# Patient Record
Sex: Female | Born: 1945 | Race: White | Hispanic: No | Smoking: Former smoker
Health system: Southern US, Community
[De-identification: ages and names within clinical notes are randomized; demographics above are authoritative.]

## PROBLEM LIST (undated history)

## (undated) DIAGNOSIS — K760 Fatty (change of) liver, not elsewhere classified: Secondary | ICD-10-CM

## (undated) DIAGNOSIS — E119 Type 2 diabetes mellitus without complications: Secondary | ICD-10-CM

## (undated) DIAGNOSIS — D509 Iron deficiency anemia, unspecified: Secondary | ICD-10-CM

## (undated) DIAGNOSIS — M1990 Unspecified osteoarthritis, unspecified site: Secondary | ICD-10-CM

## (undated) DIAGNOSIS — Z9289 Personal history of other medical treatment: Secondary | ICD-10-CM

## (undated) DIAGNOSIS — N186 End stage renal disease: Secondary | ICD-10-CM

## (undated) DIAGNOSIS — Z992 Dependence on renal dialysis: Secondary | ICD-10-CM

## (undated) DIAGNOSIS — J811 Chronic pulmonary edema: Secondary | ICD-10-CM

## (undated) DIAGNOSIS — F419 Anxiety disorder, unspecified: Secondary | ICD-10-CM

## (undated) DIAGNOSIS — R011 Cardiac murmur, unspecified: Secondary | ICD-10-CM

## (undated) DIAGNOSIS — N2581 Secondary hyperparathyroidism of renal origin: Secondary | ICD-10-CM

## (undated) DIAGNOSIS — J449 Chronic obstructive pulmonary disease, unspecified: Secondary | ICD-10-CM

## (undated) DIAGNOSIS — I5020 Unspecified systolic (congestive) heart failure: Secondary | ICD-10-CM

## (undated) DIAGNOSIS — E669 Obesity, unspecified: Secondary | ICD-10-CM

## (undated) DIAGNOSIS — F32A Depression, unspecified: Secondary | ICD-10-CM

## (undated) DIAGNOSIS — Z950 Presence of cardiac pacemaker: Secondary | ICD-10-CM

## (undated) DIAGNOSIS — D696 Thrombocytopenia, unspecified: Secondary | ICD-10-CM

## (undated) DIAGNOSIS — F329 Major depressive disorder, single episode, unspecified: Secondary | ICD-10-CM

## (undated) DIAGNOSIS — I4891 Unspecified atrial fibrillation: Secondary | ICD-10-CM

## (undated) HISTORY — PX: FRACTURE SURGERY: SHX138

## (undated) HISTORY — DX: Iron deficiency anemia, unspecified: D50.9

## (undated) HISTORY — PX: AV FISTULA PLACEMENT: SHX1204

## (undated) HISTORY — PX: AV FISTULA REPAIR: SHX563

## (undated) HISTORY — PX: INSERT / REPLACE / REMOVE PACEMAKER: SUR710

## (undated) HISTORY — PX: CARDIAC CATHETERIZATION: SHX172

---

## 2015-09-20 HISTORY — PX: CATARACT EXTRACTION W/ INTRAOCULAR LENS  IMPLANT, BILATERAL: SHX1307

## 2016-10-27 ENCOUNTER — Encounter: Payer: Self-pay | Admitting: Vascular Surgery

## 2016-10-27 ENCOUNTER — Other Ambulatory Visit: Payer: Self-pay | Admitting: *Deleted

## 2016-10-27 DIAGNOSIS — T82510A Breakdown (mechanical) of surgically created arteriovenous fistula, initial encounter: Secondary | ICD-10-CM

## 2016-10-28 ENCOUNTER — Encounter: Payer: Self-pay | Admitting: Vascular Surgery

## 2016-10-31 ENCOUNTER — Encounter: Payer: Self-pay | Admitting: Vascular Surgery

## 2016-11-07 ENCOUNTER — Other Ambulatory Visit (HOSPITAL_COMMUNITY): Payer: Medicare Other

## 2016-11-07 ENCOUNTER — Encounter: Payer: Medicare Other | Admitting: Vascular Surgery

## 2016-11-25 ENCOUNTER — Encounter: Payer: Self-pay | Admitting: Surgery

## 2016-12-05 ENCOUNTER — Encounter: Payer: Medicare Other | Admitting: Surgery

## 2016-12-05 ENCOUNTER — Inpatient Hospital Stay (HOSPITAL_COMMUNITY): Admission: RE | Admit: 2016-12-05 | Payer: Medicare Other | Source: Ambulatory Visit

## 2017-01-19 ENCOUNTER — Inpatient Hospital Stay (HOSPITAL_COMMUNITY)
Admission: EM | Admit: 2017-01-19 | Discharge: 2017-01-26 | DRG: 480 | Disposition: A | Discharge status: Skilled Nursing Facility | Payer: Medicare Other | Attending: Internal Medicine | Admitting: Internal Medicine

## 2017-01-19 ENCOUNTER — Inpatient Hospital Stay (HOSPITAL_COMMUNITY): Payer: Medicare Other

## 2017-01-19 ENCOUNTER — Emergency Department (HOSPITAL_COMMUNITY): Payer: Medicare Other

## 2017-01-19 ENCOUNTER — Encounter (HOSPITAL_COMMUNITY): Payer: Self-pay

## 2017-01-19 DIAGNOSIS — I10 Essential (primary) hypertension: Secondary | ICD-10-CM | POA: Diagnosis present

## 2017-01-19 DIAGNOSIS — I77819 Aortic ectasia, unspecified site: Secondary | ICD-10-CM | POA: Diagnosis present

## 2017-01-19 DIAGNOSIS — D62 Acute posthemorrhagic anemia: Secondary | ICD-10-CM | POA: Diagnosis not present

## 2017-01-19 DIAGNOSIS — I4891 Unspecified atrial fibrillation: Secondary | ICD-10-CM | POA: Diagnosis present

## 2017-01-19 DIAGNOSIS — I132 Hypertensive heart and chronic kidney disease with heart failure and with stage 5 chronic kidney disease, or end stage renal disease: Secondary | ICD-10-CM | POA: Diagnosis present

## 2017-01-19 DIAGNOSIS — Z880 Allergy status to penicillin: Secondary | ICD-10-CM | POA: Diagnosis not present

## 2017-01-19 DIAGNOSIS — Z87891 Personal history of nicotine dependence: Secondary | ICD-10-CM

## 2017-01-19 DIAGNOSIS — M8580 Other specified disorders of bone density and structure, unspecified site: Secondary | ICD-10-CM | POA: Diagnosis present

## 2017-01-19 DIAGNOSIS — S72009A Fracture of unspecified part of neck of unspecified femur, initial encounter for closed fracture: Secondary | ICD-10-CM

## 2017-01-19 DIAGNOSIS — E669 Obesity, unspecified: Secondary | ICD-10-CM | POA: Diagnosis present

## 2017-01-19 DIAGNOSIS — D508 Other iron deficiency anemias: Secondary | ICD-10-CM

## 2017-01-19 DIAGNOSIS — M898X9 Other specified disorders of bone, unspecified site: Secondary | ICD-10-CM | POA: Diagnosis present

## 2017-01-19 DIAGNOSIS — I350 Nonrheumatic aortic (valve) stenosis: Secondary | ICD-10-CM | POA: Diagnosis present

## 2017-01-19 DIAGNOSIS — D509 Iron deficiency anemia, unspecified: Secondary | ICD-10-CM | POA: Diagnosis present

## 2017-01-19 DIAGNOSIS — E1122 Type 2 diabetes mellitus with diabetic chronic kidney disease: Secondary | ICD-10-CM | POA: Diagnosis present

## 2017-01-19 DIAGNOSIS — Z8249 Family history of ischemic heart disease and other diseases of the circulatory system: Secondary | ICD-10-CM

## 2017-01-19 DIAGNOSIS — E119 Type 2 diabetes mellitus without complications: Secondary | ICD-10-CM | POA: Diagnosis not present

## 2017-01-19 DIAGNOSIS — Z833 Family history of diabetes mellitus: Secondary | ICD-10-CM | POA: Diagnosis not present

## 2017-01-19 DIAGNOSIS — R188 Other ascites: Secondary | ICD-10-CM | POA: Diagnosis present

## 2017-01-19 DIAGNOSIS — I480 Paroxysmal atrial fibrillation: Secondary | ICD-10-CM

## 2017-01-19 DIAGNOSIS — Z6833 Body mass index (BMI) 33.0-33.9, adult: Secondary | ICD-10-CM

## 2017-01-19 DIAGNOSIS — I5020 Unspecified systolic (congestive) heart failure: Secondary | ICD-10-CM

## 2017-01-19 DIAGNOSIS — J811 Chronic pulmonary edema: Secondary | ICD-10-CM

## 2017-01-19 DIAGNOSIS — W010XXA Fall on same level from slipping, tripping and stumbling without subsequent striking against object, initial encounter: Secondary | ICD-10-CM | POA: Diagnosis present

## 2017-01-19 DIAGNOSIS — E1129 Type 2 diabetes mellitus with other diabetic kidney complication: Secondary | ICD-10-CM | POA: Diagnosis present

## 2017-01-19 DIAGNOSIS — E871 Hypo-osmolality and hyponatremia: Secondary | ICD-10-CM | POA: Diagnosis present

## 2017-01-19 DIAGNOSIS — I5022 Chronic systolic (congestive) heart failure: Secondary | ICD-10-CM | POA: Diagnosis present

## 2017-01-19 DIAGNOSIS — I959 Hypotension, unspecified: Secondary | ICD-10-CM | POA: Diagnosis not present

## 2017-01-19 DIAGNOSIS — D61818 Other pancytopenia: Secondary | ICD-10-CM | POA: Diagnosis present

## 2017-01-19 DIAGNOSIS — E118 Type 2 diabetes mellitus with unspecified complications: Secondary | ICD-10-CM

## 2017-01-19 DIAGNOSIS — Z992 Dependence on renal dialysis: Secondary | ICD-10-CM | POA: Diagnosis not present

## 2017-01-19 DIAGNOSIS — S72002A Fracture of unspecified part of neck of left femur, initial encounter for closed fracture: Secondary | ICD-10-CM

## 2017-01-19 DIAGNOSIS — S72145A Nondisplaced intertrochanteric fracture of left femur, initial encounter for closed fracture: Principal | ICD-10-CM | POA: Diagnosis present

## 2017-01-19 DIAGNOSIS — I360 Nonrheumatic tricuspid (valve) stenosis: Secondary | ICD-10-CM | POA: Diagnosis not present

## 2017-01-19 DIAGNOSIS — K760 Fatty (change of) liver, not elsewhere classified: Secondary | ICD-10-CM | POA: Diagnosis not present

## 2017-01-19 DIAGNOSIS — Z950 Presence of cardiac pacemaker: Secondary | ICD-10-CM

## 2017-01-19 DIAGNOSIS — Z419 Encounter for procedure for purposes other than remedying health state, unspecified: Secondary | ICD-10-CM

## 2017-01-19 DIAGNOSIS — R1110 Vomiting, unspecified: Secondary | ICD-10-CM

## 2017-01-19 DIAGNOSIS — N186 End stage renal disease: Secondary | ICD-10-CM | POA: Diagnosis present

## 2017-01-19 DIAGNOSIS — Z7984 Long term (current) use of oral hypoglycemic drugs: Secondary | ICD-10-CM | POA: Diagnosis not present

## 2017-01-19 DIAGNOSIS — D696 Thrombocytopenia, unspecified: Secondary | ICD-10-CM | POA: Diagnosis present

## 2017-01-19 DIAGNOSIS — S72002D Fracture of unspecified part of neck of left femur, subsequent encounter for closed fracture with routine healing: Secondary | ICD-10-CM | POA: Diagnosis not present

## 2017-01-19 DIAGNOSIS — T148XXA Other injury of unspecified body region, initial encounter: Secondary | ICD-10-CM

## 2017-01-19 DIAGNOSIS — N189 Chronic kidney disease, unspecified: Secondary | ICD-10-CM | POA: Insufficient documentation

## 2017-01-19 DIAGNOSIS — N182 Chronic kidney disease, stage 2 (mild): Secondary | ICD-10-CM

## 2017-01-19 DIAGNOSIS — E1140 Type 2 diabetes mellitus with diabetic neuropathy, unspecified: Secondary | ICD-10-CM | POA: Diagnosis present

## 2017-01-19 DIAGNOSIS — J449 Chronic obstructive pulmonary disease, unspecified: Secondary | ICD-10-CM | POA: Diagnosis present

## 2017-01-19 DIAGNOSIS — N2581 Secondary hyperparathyroidism of renal origin: Secondary | ICD-10-CM | POA: Diagnosis present

## 2017-01-19 HISTORY — DX: Secondary hyperparathyroidism of renal origin: N25.81

## 2017-01-19 HISTORY — DX: Thrombocytopenia, unspecified: D69.6

## 2017-01-19 HISTORY — DX: Fatty (change of) liver, not elsewhere classified: K76.0

## 2017-01-19 HISTORY — DX: Presence of cardiac pacemaker: Z95.0

## 2017-01-19 HISTORY — DX: Obesity, unspecified: E66.9

## 2017-01-19 HISTORY — DX: Unspecified atrial fibrillation: I48.91

## 2017-01-19 HISTORY — DX: Unspecified systolic (congestive) heart failure: I50.20

## 2017-01-19 HISTORY — DX: Chronic pulmonary edema: J81.1

## 2017-01-19 LAB — CBC WITH DIFFERENTIAL/PLATELET
Basophils Absolute: 0 10*3/uL (ref 0.0–0.1)
Basophils Relative: 1 %
Eosinophils Absolute: 0.1 10*3/uL (ref 0.0–0.7)
Eosinophils Relative: 3 %
HCT: 32.4 % — ABNORMAL LOW (ref 36.0–46.0)
Hemoglobin: 10.2 g/dL — ABNORMAL LOW (ref 12.0–15.0)
Lymphocytes Relative: 13 %
Lymphs Abs: 0.5 10*3/uL — ABNORMAL LOW (ref 0.7–4.0)
MCH: 28.1 pg (ref 26.0–34.0)
MCHC: 31.5 g/dL (ref 30.0–36.0)
MCV: 89.3 fL (ref 78.0–100.0)
Monocytes Absolute: 0.3 10*3/uL (ref 0.1–1.0)
Monocytes Relative: 8 %
Neutro Abs: 2.7 10*3/uL (ref 1.7–7.7)
Neutrophils Relative %: 76 %
Platelets: 102 10*3/uL — ABNORMAL LOW (ref 150–400)
RBC: 3.63 MIL/uL — ABNORMAL LOW (ref 3.87–5.11)
RDW: 17 % — ABNORMAL HIGH (ref 11.5–15.5)
WBC: 3.6 10*3/uL — ABNORMAL LOW (ref 4.0–10.5)

## 2017-01-19 LAB — BASIC METABOLIC PANEL
Anion gap: 19 — ABNORMAL HIGH (ref 5–15)
BUN: 65 mg/dL — ABNORMAL HIGH (ref 6–20)
CO2: 18 mmol/L — ABNORMAL LOW (ref 22–32)
Calcium: 8.9 mg/dL (ref 8.9–10.3)
Chloride: 94 mmol/L — ABNORMAL LOW (ref 101–111)
Creatinine, Ser: 6.64 mg/dL — ABNORMAL HIGH (ref 0.44–1.00)
GFR calc Af Amer: 7 mL/min — ABNORMAL LOW (ref >60)
GFR calc non Af Amer: 6 mL/min — ABNORMAL LOW (ref >60)
Glucose, Bld: 115 mg/dL — ABNORMAL HIGH (ref 65–99)
Potassium: 4.1 mmol/L (ref 3.5–5.1)
Sodium: 131 mmol/L — ABNORMAL LOW (ref 135–145)

## 2017-01-19 LAB — HEPATIC FUNCTION PANEL
ALK PHOS: 65 U/L (ref 38–126)
ALT: 8 U/L — ABNORMAL LOW (ref 14–54)
AST: 15 U/L (ref 15–41)
Albumin: 3.7 g/dL (ref 3.5–5.0)
BILIRUBIN INDIRECT: 0.9 mg/dL (ref 0.3–0.9)
BILIRUBIN TOTAL: 1.3 mg/dL — AB (ref 0.3–1.2)
Bilirubin, Direct: 0.4 mg/dL (ref 0.1–0.5)
Total Protein: 7 g/dL (ref 6.5–8.1)

## 2017-01-19 LAB — GLUCOSE, CAPILLARY
GLUCOSE-CAPILLARY: 119 mg/dL — AB (ref 65–99)
Glucose-Capillary: 109 mg/dL — ABNORMAL HIGH (ref 65–99)

## 2017-01-19 LAB — SURGICAL PCR SCREEN
MRSA, PCR: NEGATIVE
Staphylococcus aureus: NEGATIVE

## 2017-01-19 LAB — PROTIME-INR
INR: 1.35
Prothrombin Time: 16.8 seconds — ABNORMAL HIGH (ref 11.4–15.2)

## 2017-01-19 MED ORDER — CINACALCET HCL 30 MG PO TABS: 60.0000 mg | ORAL_TABLET | ORAL | Status: DC

## 2017-01-19 MED ORDER — MORPHINE SULFATE (PF) 4 MG/ML IV SOLN
6.0000 mg | Freq: Once | INTRAVENOUS | Status: AC
  Administered 2017-01-19: 6 mg via INTRAVENOUS
  Filled 2017-01-19: qty 2

## 2017-01-19 MED ORDER — POVIDONE-IODINE 10 % EX SWAB: 2.0000 "application " | Freq: Once | CUTANEOUS | Status: DC

## 2017-01-19 MED ORDER — MOMETASONE FURO-FORMOTEROL FUM 200-5 MCG/ACT IN AERO
2.0000 | INHALATION_SPRAY | Freq: Two times a day (BID) | RESPIRATORY_TRACT | Status: DC
  Administered 2017-01-19 – 2017-01-26 (×10): 2 via RESPIRATORY_TRACT
  Filled 2017-01-19: qty 8.8

## 2017-01-19 MED ORDER — FUROSEMIDE 40 MG PO TABS
40.0000 mg | ORAL_TABLET | Freq: Two times a day (BID) | ORAL | Status: DC
  Administered 2017-01-19 – 2017-01-22 (×3): 40 mg via ORAL
  Filled 2017-01-19 (×3): qty 1

## 2017-01-19 MED ORDER — CALCITRIOL 0.5 MCG PO CAPS
1.0000 ug | ORAL_CAPSULE | ORAL | Status: DC
  Administered 2017-01-21 – 2017-01-24 (×2): 1 ug via ORAL

## 2017-01-19 MED ORDER — HYDROCODONE-ACETAMINOPHEN 5-325 MG PO TABS
1.0000 | ORAL_TABLET | Freq: Four times a day (QID) | ORAL | Status: DC | PRN
  Administered 2017-01-19 – 2017-01-20 (×2): 2 via ORAL
  Filled 2017-01-19 (×2): qty 2

## 2017-01-19 MED ORDER — MORPHINE SULFATE (PF) 4 MG/ML IV SOLN
4.0000 mg | Freq: Once | INTRAVENOUS | Status: AC
  Administered 2017-01-19: 4 mg via INTRAVENOUS
  Filled 2017-01-19: qty 1

## 2017-01-19 MED ORDER — CINACALCET HCL 30 MG PO TABS: 30.0000 mg | ORAL_TABLET | Freq: Every day | ORAL | Status: DC

## 2017-01-19 MED ORDER — FOLIC ACID 1 MG PO TABS
1.0000 mg | ORAL_TABLET | Freq: Two times a day (BID) | ORAL | Status: DC
  Administered 2017-01-19 – 2017-01-26 (×13): 1 mg via ORAL
  Filled 2017-01-19 (×13): qty 1

## 2017-01-19 MED ORDER — CHLORHEXIDINE GLUCONATE 4 % EX LIQD
60.0000 mL | Freq: Once | CUTANEOUS | Status: AC
  Administered 2017-01-20: 4 via TOPICAL

## 2017-01-19 MED ORDER — DOCUSATE SODIUM 100 MG PO CAPS
100.0000 mg | ORAL_CAPSULE | Freq: Two times a day (BID) | ORAL | Status: DC
  Administered 2017-01-19 – 2017-01-26 (×13): 100 mg via ORAL
  Filled 2017-01-19 (×13): qty 1

## 2017-01-19 MED ORDER — INSULIN ASPART 100 UNIT/ML ~~LOC~~ SOLN
0.0000 [IU] | Freq: Three times a day (TID) | SUBCUTANEOUS | Status: DC
  Administered 2017-01-21 (×2): 2 [IU] via SUBCUTANEOUS
  Administered 2017-01-22: 1 [IU] via SUBCUTANEOUS
  Administered 2017-01-22: 2 [IU] via SUBCUTANEOUS
  Administered 2017-01-24 – 2017-01-25 (×3): 1 [IU] via SUBCUTANEOUS

## 2017-01-19 MED ORDER — ZOLPIDEM TARTRATE 5 MG PO TABS
5.0000 mg | ORAL_TABLET | Freq: Every evening | ORAL | Status: DC | PRN
  Administered 2017-01-19 – 2017-01-24 (×4): 5 mg via ORAL
  Filled 2017-01-19 (×5): qty 1

## 2017-01-19 MED ORDER — SODIUM CHLORIDE 0.9 % IV SOLN
125.0000 mg | INTRAVENOUS | Status: DC
  Administered 2017-01-20 – 2017-01-21 (×2): 125 mg via INTRAVENOUS
  Filled 2017-01-19 (×4): qty 10

## 2017-01-19 MED ORDER — CLONIDINE HCL 0.2 MG PO TABS
0.3000 mg | ORAL_TABLET | Freq: Three times a day (TID) | ORAL | Status: DC
  Administered 2017-01-19 – 2017-01-21 (×3): 0.3 mg via ORAL
  Filled 2017-01-19 (×4): qty 1

## 2017-01-19 MED ORDER — LORAZEPAM 2 MG/ML IJ SOLN
0.5000 mg | Freq: Once | INTRAMUSCULAR | Status: AC
  Administered 2017-01-19: 0.5 mg via INTRAVENOUS
  Filled 2017-01-19: qty 1

## 2017-01-19 MED ORDER — SEVELAMER CARBONATE 800 MG PO TABS
800.0000 mg | ORAL_TABLET | Freq: Three times a day (TID) | ORAL | Status: DC
  Administered 2017-01-20 – 2017-01-26 (×9): 800 mg via ORAL
  Filled 2017-01-19 (×12): qty 1

## 2017-01-19 MED ORDER — CLINDAMYCIN PHOSPHATE 900 MG/50ML IV SOLN
900.0000 mg | INTRAVENOUS | Status: AC
  Administered 2017-01-20: 900 mg via INTRAVENOUS
  Filled 2017-01-19: qty 50

## 2017-01-19 MED ORDER — INSULIN ASPART 100 UNIT/ML ~~LOC~~ SOLN: 0.0000 [IU] | Freq: Every day | SUBCUTANEOUS | Status: DC

## 2017-01-19 MED ORDER — MORPHINE SULFATE (PF) 4 MG/ML IV SOLN
0.5000 mg | INTRAVENOUS | Status: DC | PRN
  Administered 2017-01-19 – 2017-01-20 (×5): 0.52 mg via INTRAVENOUS
  Filled 2017-01-19 (×4): qty 1

## 2017-01-19 MED ORDER — DIPHENHYDRAMINE HCL 25 MG PO CAPS: 25.0000 mg | ORAL_CAPSULE | Freq: Every evening | ORAL | Status: DC | PRN

## 2017-01-19 MED ORDER — SODIUM CHLORIDE 0.9 % IV SOLN: INTRAVENOUS | Status: AC

## 2017-01-19 MED ORDER — TRAZODONE HCL 50 MG PO TABS
50.0000 mg | ORAL_TABLET | Freq: Every day | ORAL | Status: DC
  Administered 2017-01-19 – 2017-01-25 (×7): 50 mg via ORAL
  Filled 2017-01-19 (×7): qty 1

## 2017-01-19 NOTE — Consult Note (Signed)
[facility] Renal Consultation Note    Indication for Consultation:  Management of ESRD/hemodialysis; anemia, hypertension/volume and secondary hyperparathyroidism PCP:  HPI: Lindsey Sutton is a 71 y.o. female with ESRD on hemodialysis T,Th,S at [facility]. Patient transferred from [place] to [place]. PMH significant for DM, HTN, pulmonary edema, systolic HF, gout, paracentesis for ascites-family reports being told she had fatty liver disease at [facility] in January 2018.  SHPT, anemia of chronic disease. Has had permanent pacemaker insertion in past. Husband of 54 years recently died. She has long history of medical non-adherence despite having very close, supportive family. She has 3 children, multiple grandchildren.   Patient was brought to ED this AM after sustaining mechanical fall at home. Patient reports no other injury, did not hit head or neck. X-ray of L. Hip shows displaced of the base of the left femoral neck with severe osteopenia. Patient has been seen by orthopedics and plans are tentatively in place to go to OR this afternoon. CXR unremarkable for acute processes, K+ 4.1. Will hold HD today for surgery and will have HD off schedule in AM.   Patient is awake, alert, cooperative in spite of pain in L hip. Any other injuries or issues. AFIB on monitor. Denies chest pain, SOB, fever, chills, nausea, vomiting, diarrhea. Still urinates, C/O foul smelling urine. Denies abdominal pain but does appear to have possible ascites on exam. No rashes, puritis, has open wounds on L and R small toes from injuries at home. Having muscle spasms LLE otherwise appears comfortable, talkative and interactive.   Patient is generally non-adherent to HD, misses treatments, has huge IDWG. She never gets to OP EDW. Non-Adherent to bone mineral disease meds with Phosphorous levels as high in 12.7 12/15/16, recently 9.8 01/14/17. PTH levels 1453-1752. Patient has been  counseled extensively regarding adherence to HD and risk for fractures D/T non-compliance with BMD medications. Patient is always pleasant, just never follows instructions. Patient's family reports same issue at home.   Past Medical History:  Diagnosis Date  . Anxiety disorder   . Chronic kidney disease   . Diabetes mellitus without complication (HCC)   . Iron deficiency anemia   . Pacemaker    Past Surgical History:  Procedure Laterality Date  . PACEMAKER IMPLANT     No family history on file. Social History:  reports that she has quit smoking. She has never used smokeless tobacco. She reports that she does not drink alcohol or use drugs. Allergies  Allergen Reactions  . Penicillins     Has patient had a PCN reaction causing immediate rash, facial/tongue/throat swelling, SOB or lightheadedness with hypotension: Yes Has patient had a PCN reaction causing severe rash involving mucus membranes or skin necrosis: No Has patient had a PCN reaction that required hospitalization No Has patient had a PCN reaction occurring within the last 10 years: No If all of the above answers are "NO", then may proceed with Cephalosporin use.    Prior to Admission medications   Medication Sig Start Date End Date Taking? Authorizing Provider  acetaminophen (TYLENOL) 650 MG CR tablet Take 650 mg by mouth every 8 (eight) hours as needed for pain.   Yes Historical Provider, MD  budesonide-formoterol (SYMBICORT) 160-4.5 MCG/ACT inhaler Inhale 2 puffs into the lungs 2 (two) times daily.   Yes Historical Provider, MD  cinacalcet (SENSIPAR) 30 MG tablet Take 30 mg by mouth daily with supper.   Yes Historical Provider, MD  cloNIDine (CATAPRES) 0.3 MG tablet  Take 0.3 mg by mouth 3 (three) times daily.   Yes Historical Provider, MD  diphenhydrAMINE (SOMINEX) 25 MG tablet Take 25 mg by mouth at bedtime as needed for sleep.   Yes Historical Provider, MD  folic acid (FOLVITE) 1 MG tablet Take 1 mg by mouth 2 (two)  times daily.   Yes Historical Provider, MD  furosemide (LASIX) 40 MG tablet Take 40 mg by mouth 2 (two) times daily.   Yes Historical Provider, MD  glipiZIDE (GLUCOTROL) 5 MG tablet Take by mouth daily before breakfast.   Yes Historical Provider, MD  Lidocaine-Prilocaine, Bulk, 2.5-2.5 % CREA by Does not apply route as directed.   Yes Historical Provider, MD  sevelamer carbonate (RENVELA) 800 MG tablet Take 800 mg by mouth 3 (three) times daily with meals.   Yes Historical Provider, MD  traZODone (DESYREL) 50 MG tablet Take 50 mg by mouth at bedtime.   Yes Historical Provider, MD   Current Facility-Administered Medications  Medication Dose Route Frequency Provider Last Rate Last Dose  . 0.9 %  sodium chloride infusion   Intravenous Continuous Gwenyth BenderKaren M Black, NP      . cinacalcet (SENSIPAR) tablet 30 mg  30 mg Oral Q supper Gwenyth BenderKaren M Black, NP      . cloNIDine (CATAPRES) tablet 0.3 mg  0.3 mg Oral TID Gwenyth BenderKaren M Black, NP      . diphenhydrAMINE [facility](SOMINEX) tablet 25 mg  25 mg Oral QHS PRN Gwenyth BenderKaren M Black, NP      . docusate sodium (COLACE) capsule 100 mg  100 mg Oral BID Lesle ChrisKaren M Black, NP      . folic acid (FOLVITE) tablet 1 mg  1 mg Oral BID Lesle ChrisKaren M Black, NP      . furosemide (LASIX) tablet 40 mg  40 mg Oral BID Gwenyth BenderKaren M Black, NP      . HYDROcodone-acetaminophen (NORCO/VICODIN) 5-325 MG per tablet 1-2 tablet  1-2 tablet Oral Q6H PRN Gwenyth BenderKaren M Black, NP      . insulin aspart (novoLOG) injection 0-5 Units  0-5 Units Subcutaneous QHS Lesle ChrisKaren M Black, NP      . insulin aspart (novoLOG) injection 0-9 Units  0-9 Units Subcutaneous TID WC Lesle ChrisKaren M Black, NP      . mometasone-formoterol [facility](DULERA) 200-5 MCG/ACT inhaler 2 puff  2 puff Inhalation BID Lesle ChrisKaren M Black, NP      . morphine 4 MG/ML injection 0.52 mg  0.52 mg Intravenous Q2H PRN Lesle ChrisKaren M Black, NP      . sevelamer carbonate (RENVELA) tablet 800 mg  800 mg Oral TID WC Gwenyth BenderKaren M Black, NP      . traZODone (DESYREL) tablet 50 mg  50 mg Oral QHS Lesle ChrisKaren M Black, NP       . zolpidem (AMBIEN) tablet 5 mg  5 mg Oral QHS PRN Gwenyth BenderKaren M Black, NP       Current Outpatient Prescriptions  Medication Sig Dispense Refill  . acetaminophen (TYLENOL) 650 MG CR tablet Take 650 mg by mouth every 8 (eight) hours as needed for pain.    . budesonide-formoterol (SYMBICORT) 160-4.5 MCG/ACT inhaler Inhale 2 puffs into the lungs 2 (two) times daily.    . cinacalcet (SENSIPAR) 30 MG tablet Take 30 mg by mouth daily with supper.    . cloNIDine (CATAPRES) 0.3 MG tablet Take 0.3 mg by mouth 3 (three) times daily.    . diphenhydrAMINE (SOMINEX) 25 MG tablet Take 25 mg by mouth at bedtime as needed for  sleep.    . folic acid (FOLVITE) 1 MG tablet Take 1 mg by mouth 2 (two) times daily.    . furosemide (LASIX) 40 MG tablet Take 40 mg by mouth 2 (two) times daily.    Marland Kitchen glipiZIDE (GLUCOTROL) 5 MG tablet Take by mouth daily before breakfast.    . Lidocaine-Prilocaine, Bulk, 2.5-2.5 % CREA by Does not apply route as directed.    . sevelamer carbonate (RENVELA) 800 MG tablet Take 800 mg by mouth 3 (three) times daily with meals.    . traZODone (DESYREL) 50 MG tablet Take 50 mg by mouth at bedtime.     Labs: Basic Metabolic Panel:  Recent Labs Lab 01/19/17 1147  NA 131*  K 4.1  CL 94*  CO2 18*  GLUCOSE 115*  BUN 65*  CREATININE 6.64*  CALCIUM 8.9   Liver Function Tests: No results for input(s): AST, ALT, ALKPHOS, BILITOT, PROT, ALBUMIN in the last 168 hours. No results for input(s): LIPASE, AMYLASE in the last 168 hours. No results for input(s): AMMONIA in the last 168 hours. CBC:  Recent Labs Lab 01/19/17 1147  WBC 3.6*  NEUTROABS 2.7  HGB 10.2*  HCT 32.4*  MCV 89.3  PLT 102*   Cardiac Enzymes: No results for input(s): CKTOTAL, CKMB, CKMBINDEX, TROPONINI in the last 168 hours. CBG: No results for input(s): GLUCAP in the last 168 hours. Iron Studies: No results for input(s): IRON, TIBC, TRANSFERRIN, FERRITIN in the last 72 hours. Studies/Results: Dg Chest 1  View  Result Date: 01/19/2017 CLINICAL DATA:  Left femur fracture.  Diabetes. EXAM: CHEST 1 VIEW COMPARISON:  None. FINDINGS: Single lead pacemaker in the region of the right ventricle. Cardiomegaly. Aortic atherosclerosis. Pulmonary venous hypertension without edema. No infiltrate, collapse or effusion. No acute bone finding. IMPRESSION: Single lead pacemaker. Cardiomegaly. Aortic atherosclerosis. Pulmonary venous hypertension. Electronically Signed   By: Paulina Fusi M.D.   On: 01/19/2017 13:38   Dg Hip Unilat With Pelvis 2-3 Views Left  Result Date: 01/19/2017 CLINICAL DATA:  Fall.  Left hip pain. EXAM: DG HIP (WITH OR WITHOUT PELVIS) 2-3V LEFT COMPARISON:  No recent prior . FINDINGS: Diffuse osteopenia degenerative change. Fracture of the base of the left femoral neck appears to be present. No prominent displacement. Aortoiliac atherosclerotic vascular calcification. IMPRESSION: 1. Nondisplaced fracture of the base of the left femoral neck appears to be present. 2. Severe osteopenia. Degenerative changes lumbar spine and both hips. 3. Aortoiliac atherosclerotic vascular disease. Electronically Signed   By: Maisie Fus  Register   On: 01/19/2017 12:49    ROS: As per HPI otherwise negative.   Physical Exam: Vitals:   01/19/17 1330 01/19/17 1400 01/19/17 1415 01/19/17 1430  BP: (!) 147/73 (!) 151/87 137/87 130/70  Pulse: 68 74 75 80  Resp: 12 17 20 17   Temp:      TempSrc:      SpO2: 98% 100% 98% 99%  Weight:      Height:         General: Chronically ill appearing female in NAD unless she moves LLE.  Head: Normocephalic, atraumatic, sclera non-icteric, mucus membranes are dry Neck: Supple. JVD not elevated. Lungs: Clear bilaterally to auscultation without wheezes, rales, or rhonchi. Breathing is unlabored. Heart: Regularly irregular, S1,S2 no M/R/G. Afib on monitor rate in 70s.  Abdomen: Soft, non-tender, possible ascites present. Active BS X 4 quadrants. Patient continues to urinate.  M-S:   Internal rotation of LLE noted. UE strength 5/5 Lower extremities:without edema or ischemic changes, Has  ulcers on L and R 5th toes in various stages of healing.  Neuro: Alert and oriented X 3. Moves all extremities spontaneously. Psych:  Responds to questions appropriately with a normal affect. Dialysis Access: LFA AVF + bruit  Dialysis Orders: [place] T,Th,S 4 hrs 180 NRe 94 kg 2.0 K/2.0 Ca 450/800 -Heparin 5000 units TIW -Senipar 30 mg PO TIW (Last PTH 1453 01/14/17) -Calcitriol 1.0 mcg PO TIW -Mircera 225 mcg IV q 2 weeks (last dose 01/10/17 HGB 10.5 01/14/17) -Venofer 50 mg IV weekly (last dose 01/14/17 Ferritin 977 Fe 42 TSat 19% 01/14/17)  BMD meds:  Renvela 800 mg 3 tabs PO TID AC and 2 tabs with snacks (Last Phos 9.4 Ca 8.7 C Ca 8.8 01/14/17)  Assessment/Plan: 1.  L femoral neck Fx: Ortho has been consulted, OR tentatively planned for today.  2.  ESRD -  T,Th,S Missed HD today. K+ 4.1. Will have HD tomorrow, 1st shift. No heparin.  3.  Hypertension/volume  -BP controled at present-has been receiving morphine for pain. Clonidine 0.3 mg PO TID on OP med list. Patient usually has high IDWG-never gets to goal. HD tomorrow. 4-5 liters as tolerate. Work on lowering volume, adjust EDW on DC.  4.  Anemia  -HGB 10.2 ESA due next week. Follow HGB post op. Last OP T Sat 19% Load with  Fe with HD tomorrow X 5 doses.  5.  Metabolic bone disease - Continue sensipar, binders, VDRA. 6.  Nutrition - NPO at present 7. DM: per primary.   Rita H. Manson Passey, NP-C 01/19/2017, 3:04 PM  [employer] [phone]  Pt seen, examined and agree w A/P as above.  Vinson Moselle MD [employer] pager [phone]   01/19/2017, 4:25 PM

## 2017-01-19 NOTE — H&P (Signed)
History and Physical    Lindsey Sutton ZOX:[id number] DOB: 10-21-1945 DOA: 01/19/2017  PCP: [name], NP Patient coming from: home  Chief Complaint: fall/left hip pain  HPI: Lindsey Sutton is a pleasant 71 y.o. female with medical history significant end-stage renal disease a Tuesday Thursday Saturday dialysis patient, A. fib status post pacemaker, iron deficiency anemia, diabetes, chronic kidney disease, anxiety disorder presents to the emergency department with the chief complaint of left hip pain related to a mechanical fall. Initial evaluation reveals left hip fracture. Evaluated by orthopedics. To take her to the emergency department this afternoon. [facility] asked to admit  Information is obtained from the patient and her family is at the bedside. She was in her usual state of health until this morning she tripped over a [place] and fell. She denies hitting her head or losing consciousness. She states she immediately felt left hip pain and was unable to get up. Family called 911 she was transported to [facility] department. She denies any headache dizziness syncope or near-syncope. She denies any chest pain palpitation shortness of breath nausea vomiting diarrhea constipation. She is due for dialysis today and has not had any dialysis. She reports being on dialysis for 8 years. She states she recently transferred to [place] from [place].   ED Course: In the emergency department she's afebrile hemodynamically stable and not hypoxic.  Review of Systems: As per HPI otherwise all other systems reviewed and are negative.   Ambulatory Status: She lives at home alone with family nearby she ambulates independently is independent with ADLs.  Past Medical History:  Diagnosis Date  . A-fib (HCC)   . Anxiety disorder   . Chronic kidney disease   . Diabetes mellitus without complication (HCC)   . Iron deficiency anemia   . Pacemaker   . Pulmonary edema   . Systolic  heart failure [facility])     Past Surgical History:  Procedure Laterality Date  . PACEMAKER IMPLANT      Social History   Social History  . Marital status: Married    Spouse name: N/A  . Number of children: N/A  . Years of education: N/A   Occupational History  . Not on file.   Social History Main Topics  . Smoking status: Former Games developer  . Smokeless tobacco: Never Used  . Alcohol use No  . Drug use: No  . Sexual activity: Not on file   Other Topics Concern  . Not on file   Social History Narrative  . No narrative on file  Her husband recently died and she has moved to [place] from [place] to be near family  Allergies  Allergen Reactions  . Penicillins     Has patient had a PCN reaction causing immediate rash, facial/tongue/throat swelling, SOB or lightheadedness with hypotension: Yes Has patient had a PCN reaction causing severe rash involving mucus membranes or skin necrosis: No Has patient had a PCN reaction that required hospitalization No Has patient had a PCN reaction occurring within the last 10 years: No If all of the above answers are "NO", then may proceed with Cephalosporin use.     Family History  Problem Relation Age of Onset  . Diabetes Mother   . Hypertension Father     Prior to Admission medications   Medication Sig Start Date End Date Taking? Authorizing Provider  acetaminophen (TYLENOL) 650 MG CR tablet Take 650 mg by mouth every 8 (eight) hours as needed for pain.  Yes Historical Provider, MD  budesonide-formoterol (SYMBICORT) 160-4.5 MCG/ACT inhaler Inhale 2 puffs into the lungs 2 (two) times daily.   Yes Historical Provider, MD  cinacalcet (SENSIPAR) 30 MG tablet Take 30 mg by mouth daily with supper.   Yes Historical Provider, MD  cloNIDine (CATAPRES) 0.3 MG tablet Take 0.3 mg by mouth 3 (three) times daily.   Yes Historical Provider, MD  diphenhydrAMINE (SOMINEX) 25 MG tablet Take 25 mg by mouth at bedtime as needed for sleep.   Yes  Historical Provider, MD  folic acid (FOLVITE) 1 MG tablet Take 1 mg by mouth 2 (two) times daily.   Yes Historical Provider, MD  furosemide (LASIX) 40 MG tablet Take 40 mg by mouth 2 (two) times daily.   Yes Historical Provider, MD  glipiZIDE (GLUCOTROL) 5 MG tablet Take by mouth daily before breakfast.   Yes Historical Provider, MD  Lidocaine-Prilocaine, Bulk, 2.5-2.5 % CREA by Does not apply route as directed.   Yes Historical Provider, MD  sevelamer carbonate (RENVELA) 800 MG tablet Take 800 mg by mouth 3 (three) times daily with meals.   Yes Historical Provider, MD  traZODone (DESYREL) 50 MG tablet Take 50 mg by mouth at bedtime.   Yes Historical Provider, MD    Physical Exam: Vitals:   01/19/17 1415 01/19/17 1430 01/19/17 1515 01/19/17 1530  BP: 137/87 130/70 (!) 145/84 126/71  Pulse: 75 80 77 80  Resp: 20 17 15 13   Temp:      TempSrc:      SpO2: 98% 99% 97% 92%  Weight:      Height:         General:  Appears calm and comfortable, slightly lethargic Eyes:  PERRL, EOMI, normal lids, iris ENT:  grossly normal hearing, lips & tongue, mucous membranes of her mouth are pink and dry Neck:  no LAD, masses or thyromegaly Cardiovascular:  Irregularly irregular, no m/r/g. No LE edema. Pulses present and palpable Respiratory:  Normal respiratory effort. Sounds slightly distant but good air movement hear no wheezes no rhonchi Abdomen:  soft, nt, slightly distended + fluid wave, positive bowel sounds throughout no guarding or rebounding Skin:  no rash or induration seen on limited exam Musculoskeletal:  grossly normal tone BUE/BLE, good ROM, leg slightly shorter left hip tender to touch Psychiatric:  grossly normal mood and affect, speech fluent and appropriate, AOx3 Neurologic:  CN 2-12 grossly intact, moves all extremities in coordinated fashion, sensation intact, oriented 3 slightly lethargic  Labs on Admission: I have personally reviewed following labs and imaging  studies  CBC:  Recent Labs Lab 01/19/17 1147  WBC 3.6*  NEUTROABS 2.7  HGB 10.2*  HCT 32.4*  MCV 89.3  PLT 102*   Basic Metabolic Panel:  Recent Labs Lab 01/19/17 1147  NA 131*  K 4.1  CL 94*  CO2 18*  GLUCOSE 115*  BUN 65*  CREATININE 6.64*  CALCIUM 8.9   GFR: Estimated Creatinine Clearance: 9.4 mL/min (A) (by C-G formula based on SCr of 6.64 mg/dL (H)). Liver Function Tests: No results for input(s): AST, ALT, ALKPHOS, BILITOT, PROT, ALBUMIN in the last 168 hours. No results for input(s): LIPASE, AMYLASE in the last 168 hours. No results for input(s): AMMONIA in the last 168 hours. Coagulation Profile: No results for input(s): INR, PROTIME in the last 168 hours. Cardiac Enzymes: No results for input(s): CKTOTAL, CKMB, CKMBINDEX, TROPONINI in the last 168 hours. BNP (last 3 results) No results for input(s): PROBNP in the last 8760 hours.  HbA1C: No results for input(s): HGBA1C in the last 72 hours. CBG: No results for input(s): GLUCAP in the last 168 hours. Lipid Profile: No results for input(s): CHOL, HDL, LDLCALC, TRIG, CHOLHDL, LDLDIRECT in the last 72 hours. Thyroid Function Tests: No results for input(s): TSH, T4TOTAL, FREET4, T3FREE, THYROIDAB in the last 72 hours. Anemia Panel: No results for input(s): VITAMINB12, FOLATE, FERRITIN, TIBC, IRON, RETICCTPCT in the last 72 hours. Urine analysis: No results found for: COLORURINE, APPEARANCEUR, LABSPEC, PHURINE, GLUCOSEU, HGBUR, BILIRUBINUR, KETONESUR, PROTEINUR, UROBILINOGEN, NITRITE, LEUKOCYTESUR  Creatinine Clearance: Estimated Creatinine Clearance: 9.4 mL/min (A) (by C-G formula based on SCr of 6.64 mg/dL (H)).  Sepsis Labs: @LABRCNTIP (procalcitonin:4,lacticidven:4) )No results found for this or any previous visit (from the past 240 hour(s)).   Radiological Exams on Admission: Dg Chest 1 View  Result Date: 01/19/2017 CLINICAL DATA:  Left femur fracture.  Diabetes. EXAM: CHEST 1 VIEW COMPARISON:  None.  FINDINGS: Single lead pacemaker in the region of the right ventricle. Cardiomegaly. Aortic atherosclerosis. Pulmonary venous hypertension without edema. No infiltrate, collapse or effusion. No acute bone finding. IMPRESSION: Single lead pacemaker. Cardiomegaly. Aortic atherosclerosis. Pulmonary venous hypertension. Electronically Signed   By: Paulina Fusi M.D.   On: 01/19/2017 13:38   Ct Hip Left Wo Contrast  Result Date: 01/19/2017 CLINICAL DATA:  Status post trip and fall today with a left hip fracture. Initial encounter. EXAM: CT OF THE LEFT HIP WITHOUT CONTRAST TECHNIQUE: Multidetector CT imaging of the left hip was performed according to the standard protocol. Multiplanar CT image reconstructions were also generated. COMPARISON:  Plain films left hip earlier today. FINDINGS: Bones/Joint/Cartilage As seen on the comparison plain films, the patient has a nondisplaced fracture of the left hip extending from the base of the femoral neck superiorly along the intertrochanteric ridge to the lesser trochanter. The left hip is located. No other fracture is identified. Mild degenerative changes seen about the left hip Ligaments Suboptimally assessed by CT. Muscles and Tendons Intact. Soft tissues There is a large volume of pelvic ascites. Extensive iliac atherosclerosis is identified. Calcified uterine fibroids are noted. IMPRESSION: Acute nondisplaced left hip fracture extends from the base the femoral neck superiorly along the trochanteric region of the lesser trochanter. No other acute bony abnormality. Large volume of pelvic ascites. Extensive atherosclerosis. Electronically Signed   By: Drusilla Kanner M.D.   On: 01/19/2017 15:48   Dg Hip Unilat With Pelvis 2-3 Views Left  Result Date: 01/19/2017 CLINICAL DATA:  Fall.  Left hip pain. EXAM: DG HIP (WITH OR WITHOUT PELVIS) 2-3V LEFT COMPARISON:  No recent prior . FINDINGS: Diffuse osteopenia degenerative change. Fracture of the base of the left femoral neck  appears to be present. No prominent displacement. Aortoiliac atherosclerotic vascular calcification. IMPRESSION: 1. Nondisplaced fracture of the base of the left femoral neck appears to be present. 2. Severe osteopenia. Degenerative changes lumbar spine and both hips. 3. Aortoiliac atherosclerotic vascular disease. Electronically Signed   By: Maisie Fus  Register   On: 01/19/2017 12:49   Dg Knee 3 Views Left  Result Date: 01/19/2017 CLINICAL DATA:  Closed left hip fracture. Clinical concern for a knee fracture. EXAM: LEFT KNEE - 3 VIEW COMPARISON:  None. FINDINGS: Diffuse osteopenia. Marked medial and lateral joint space narrowing. Mild patellofemoral spur formation. Moderate-sized effusion. Anterior and medial soft tissue swelling. No fracture or dislocation visualized on limited views due to limited ability of the patient to cooperate for positioning due to the hip fracture. Dense atheromatous arterial calcifications. IMPRESSION: Limited examination demonstrating  a moderate-sized effusion, tricompartmental degenerative changes and no visible fracture. If there is a continued clinical concern for the possibility of a knee fracture, a knee CT without contrast would be recommended since the patient was unable to cooperate fully for positioning on the radiographs. Electronically Signed   By: Beckie Salts M.D.   On: 01/19/2017 15:31    EKG: Independently reviewed. Atrial fibrillation Premature ventricular complexes Nonspecific IVCD with LAD Nonspecific T abnormalities, diffuse leads  Assessment/Plan Principal Problem:   Hip fx (HCC) Active Problems:   Pacemaker   Iron deficiency anemia   Diabetes mellitus without complication (HCC)   A-fib (HCC)   Systolic heart failure (HCC)   ESRD (end stage renal disease) (HCC)  #1. Hip fracture left secondary to mechanical fall. Nondisplaced fracture left femoral neck with severe osteopenia per x-ray. -Admit -Pain management -Surgery tentatively planned for this  afternoon -Defer management orthopedics -Follow CT -recommending surgery postponed until further data collected for clearanc  #2. afib [place] vasc score 4. EKG as noted above Not on anticoagulation. s/p pacer -monitor -echo -Continue home meds   #3. End-stage renal disease. She is a Tuesday Thursday Saturday dialysis patient. She did not have dialysis today. Creatinine 6.6. Potassium level 4.1 -Nephrology consult requested and recommending dialysis tomorrow  #4. Systolic heart failure. Appears compensated. His x-ray with cardiomegaly and venous pulmonary hypertension. -Obtain records from PCP -Continue home meds -echo -Monitor daily weights -Intake and output  #5. Diabetes. Serum glucose 115 on admission. Meds include glipizide -Hold glipizide for now Sliding scale insulin for optimal control  #6. Iron deficiency anemia. Likely related to chronic disease. Hemoglobin 10.2 on admission -Monitor closely  #7. ? Liver disease. Family reports periodic paracentesis for ascites. Last one January in [place] -obtain LFT -RUQ [place]  -consider paracentesis after surgery if indicated   DVT prophylaxis: scd  Code Status: full  Family Communication:  Children at bedside Disposition Plan: snf  Consults called: ortho, shertz  Admission status: inpatient    Gwenyth Bender MD [facility]  If 7PM-7AM, please contact night-coverage [email or web address] Password [email or web address]  01/19/2017, 4:14 PM

## 2017-01-19 NOTE — ED Triage Notes (Signed)
Per EMS, Pt is coming from home for complaint of fall. Pt tripped over a cat toy on the way to the door. Pt is complaining of left hip pain. Denies LOC, Neck or back Pain. Rotation and Shortening to the left leg. Hx of Dialysis pt TThSat and Pacemaker. Pt was due for dialysis today at 12:00. Vitals per EMS: 140/92, 60 HR, 98% on RA, 134 CBG.

## 2017-01-19 NOTE — Progress Notes (Signed)
Orthopedic Tech Progress Note Patient Details:  Lindsey Sutton 10/09/1945 [id number]  Patient ID: Lindsey Sutton, female   DOB: 10/11/1945, 71 y.o.   MRN: [id number]  Applied Over head frame with trapeze bar.  Pt instructed on use of device.  Alvina ChouWilliams, [name] 01/19/2017, 5:50 PM

## 2017-01-19 NOTE — ED Provider Notes (Signed)
[facility]-EMERGENCY DEPT Provider Note   CSN: [id number] Arrival date & time: 01/19/17  1115  By signing my name below, I, Lindsey Sutton, attest that this documentation has been prepared under the direction and in the presence of [name], MD. Electronically Signed: Sonum Sutton, Neurosurgeon. 01/19/17. 11:40 AM.  History   Chief Complaint Chief Complaint  Patient presents with  . Fall     The history is provided by the patient and a relative. No language interpreter was used.     HPI Comments: Lindsey Sutton is a 71 y.o. female brought in by ambulance, who presents to the Emergency Department complaining of a fall that occurred PTA. Patient was walking when she tripped over a small toy, striking her left hip. She denies head injury or LOC. She currently complains of unchanged left hip pain that is worsened with movement. She receives hemodialysis T/Th/Sat and states she is due today at 12pm. She denies HA, neck pain, back pain.   Past Medical History:  Diagnosis Date  . Anxiety disorder   . Chronic kidney disease   . Diabetes mellitus without complication (HCC)   . Iron deficiency anemia   . Pacemaker     There are no active problems to display for this patient.   Past Surgical History:  Procedure Laterality Date  . PACEMAKER IMPLANT      OB History    No data available       Home Medications    Prior to Admission medications   Medication Sig Start Date End Date Taking? Authorizing Provider  acetaminophen (TYLENOL) 650 MG CR tablet Take 650 mg by mouth every 8 (eight) hours as needed for pain.    Historical Provider, MD  budesonide-formoterol (SYMBICORT) 160-4.5 MCG/ACT inhaler Inhale 2 puffs into the lungs 2 (two) times daily.    Historical Provider, MD  cinacalcet (SENSIPAR) 30 MG tablet Take 30 mg by mouth daily with supper.    Historical Provider, MD  cloNIDine (CATAPRES) 0.3 MG tablet Take 0.3 mg by mouth 3 (three) times daily.    Historical Provider, MD    diphenhydrAMINE (SOMINEX) 25 MG tablet Take 25 mg by mouth at bedtime as needed for sleep.    Historical Provider, MD  folic acid (FOLVITE) 1 MG tablet Take 1 mg by mouth 2 (two) times daily.    Historical Provider, MD  furosemide (LASIX) 40 MG tablet Take 40 mg by mouth 2 (two) times daily.    Historical Provider, MD  glipiZIDE (GLUCOTROL) 5 MG tablet Take by mouth daily before breakfast.    Historical Provider, MD  Lidocaine-Prilocaine, Bulk, 2.5-2.5 % CREA by Does not apply route as directed.    Historical Provider, MD  sevelamer carbonate (RENVELA) 800 MG tablet Take 800 mg by mouth 3 (three) times daily with meals.    Historical Provider, MD    Family History No family history on file.  Social History Social History  Substance Use Topics  . Smoking status: Former Games developer  . Smokeless tobacco: Never Used  . Alcohol use No     Allergies   Penicillins   Review of Systems Review of Systems  All other systems reviewed and are negative for acute change except as noted in the HPI.  Physical Exam Updated Vital Signs Pulse 61   Temp 97.6 F (36.4 C) (Oral)   Resp 18   Ht 5\' 7"  (1.702 m)   Wt 213 lb (96.6 kg)   SpO2 96%   BMI 33.36 kg/m  Physical Exam  Constitutional: She is oriented to person, place, and time. She appears well-developed and well-nourished. No distress.  HENT:  Head: Normocephalic and atraumatic.  Eyes: EOM are normal.  Neck: Normal range of motion.  Cardiovascular: Normal rate, regular rhythm and normal heart sounds.    AV fistula left forearm with palpable thrill.   Pulmonary/Chest: Effort normal and breath sounds normal.  Abdominal: Soft. She exhibits no distension. There is no tenderness.  Musculoskeletal:  LLE appears slightly shortened, not malrotated. Severe pain with attempted ROM of the left hip. NVI distally.  Neurological: She is alert and oriented to person, place, and time.  Skin: Skin is warm and dry.  Psychiatric: She has a normal  mood and affect. Judgment normal.  Nursing note and vitals reviewed.    ED Treatments / Results  DIAGNOSTIC STUDIES: Oxygen Saturation is 96% on RA, adequate by my interpretation.    COORDINATION OF CARE: 11:40 AM Discussed treatment plan with pt at bedside and pt agreed to plan.   Labs (all labs ordered are listed, but only abnormal results are displayed) Labs Reviewed - No data to display  EKG  EKG Interpretation None       Radiology No results found.   Dg Chest 1 View  Result Date: 01/19/2017 CLINICAL DATA:  Left femur fracture.  Diabetes. EXAM: CHEST 1 VIEW COMPARISON:  None. FINDINGS: Single lead pacemaker in the region of the right ventricle. Cardiomegaly. Aortic atherosclerosis. Pulmonary venous hypertension without edema. No infiltrate, collapse or effusion. No acute bone finding. IMPRESSION: Single lead pacemaker. Cardiomegaly. Aortic atherosclerosis. Pulmonary venous hypertension. Electronically Signed   By: Paulina FusiMark  Shogry M.D.   On: 01/19/2017 13:38    Dg Hip Unilat With Pelvis 2-3 Views Left  Result Date: 01/19/2017 CLINICAL DATA:  Fall.  Left hip pain. EXAM: DG HIP (WITH OR WITHOUT PELVIS) 2-3V LEFT COMPARISON:  No recent prior . FINDINGS: Diffuse osteopenia degenerative change. Fracture of the base of the left femoral neck appears to be present. No prominent displacement. Aortoiliac atherosclerotic vascular calcification. IMPRESSION: 1. Nondisplaced fracture of the base of the left femoral neck appears to be present. 2. Severe osteopenia. Degenerative changes lumbar spine and both hips. 3. Aortoiliac atherosclerotic vascular disease. Electronically Signed   By: Maisie Fushomas  Register   On: 01/19/2017 12:49    Procedures Procedures (including critical care time)  Medications Ordered in ED Medications - No data to display   Initial Impression / Assessment and Plan / ED Course  I have reviewed the triage vital signs and the nursing notes.  Pertinent labs & imaging  results that were available during my care of the patient were reviewed by me and considered in my medical decision making (see chart for details).     Closed hip fx. NVI. Ortho consultation. Admit.   Final Clinical Impressions(s) / ED Diagnoses   Final diagnoses:  Closed left hip fracture (HCC)  Fracture  Pacemaker  Other iron deficiency anemia  Diabetes mellitus without complication (HCC)  Stage 2 chronic kidney disease  Systolic heart failure, unspecified HF chronicity (HCC)  Chronic pulmonary edema  Ascites  Surgery, elective  Closed fracture of left hip, initial encounter (HCC)  Hip fx (HCC)  Vomiting    New Prescriptions New Prescriptions   No medications on file   I personally preformed the services scribed in my presence. The recorded information has been reviewed is accurate. Raeford RazorStephen Rage Beever, MD.    Raeford RazorKohut, Zyshawn Bohnenkamp, MD 01/29/17 [phone]

## 2017-01-19 NOTE — Consult Note (Signed)
Reason for Consult:Left hip fx Referring Physician: Meaghann Sutton is an 71 y.o. female.  HPI: Lindsey Sutton was at home when she slipped on a cat toy and fell. She did not lose consciousness. The family called 48 and she was brought to [facility] ED and diagnosed with a left hip fracture. Orthopedic surgery was consulted.  Past Medical History:  Diagnosis Date  . Anxiety disorder   . Chronic kidney disease   . Diabetes mellitus without complication ([place])   . Iron deficiency anemia   . Pacemaker     Past Surgical History:  Procedure Laterality Date  . PACEMAKER IMPLANT      No family history on file.  Social History:  reports that she has quit smoking. She has never used smokeless tobacco. She reports that she does not drink alcohol or use drugs.  Allergies:  Allergies  Allergen Reactions  . Penicillins     Has patient had a PCN reaction causing immediate rash, facial/tongue/throat swelling, SOB or lightheadedness with hypotension: Yes Has patient had a PCN reaction causing severe rash involving mucus membranes or skin necrosis: No Has patient had a PCN reaction that required hospitalization No Has patient had a PCN reaction occurring within the last 10 years: No If all of the above answers are "NO", then may proceed with Cephalosporin use.     Medications: I have reviewed the patient's current medications.  Results for orders placed or performed during the hospital encounter of 01/19/17 (from the past 48 hour(s))  CBC with Differential     Status: Abnormal   Collection Time: 01/19/17 11:47 AM  Result Value Ref Range   WBC 3.6 (L) 4.0 - 10.5 K/uL    Comment: REPEATED TO VERIFY   RBC 3.63 (L) 3.87 - 5.11 MIL/uL   Hemoglobin 10.2 (L) 12.0 - 15.0 g/dL    Comment: REPEATED TO VERIFY   HCT 32.4 (L) 36.0 - 46.0 %   MCV 89.3 78.0 - 100.0 fL   MCH 28.1 26.0 - 34.0 pg   MCHC 31.5 30.0 - 36.0 g/dL   RDW 17.0 (H) 11.5 - 15.5 %   Platelets 102 (L) 150 - 400 K/uL    Comment:  REPEATED TO VERIFY PLATELET COUNT CONFIRMED BY SMEAR    Neutrophils Relative % 76 %   Neutro Abs 2.7 1.7 - 7.7 K/uL   Lymphocytes Relative 13 %   Lymphs Abs 0.5 (L) 0.7 - 4.0 K/uL   Monocytes Relative 8 %   Monocytes Absolute 0.3 0.1 - 1.0 K/uL   Eosinophils Relative 3 %   Eosinophils Absolute 0.1 0.0 - 0.7 K/uL   Basophils Relative 1 %   Basophils Absolute 0.0 0.0 - 0.1 K/uL  Basic metabolic panel     Status: Abnormal   Collection Time: 01/19/17 11:47 AM  Result Value Ref Range   Sodium 131 (L) 135 - 145 mmol/L   Potassium 4.1 3.5 - 5.1 mmol/L   Chloride 94 (L) 101 - 111 mmol/L   CO2 18 (L) 22 - 32 mmol/L   Glucose, Bld 115 (H) 65 - 99 mg/dL   BUN 65 (H) 6 - 20 mg/dL   Creatinine, Ser 6.64 (H) 0.44 - 1.00 mg/dL   Calcium 8.9 8.9 - 10.3 mg/dL   GFR calc non Af Amer 6 (L) >60 mL/min   GFR calc Af Amer 7 (L) >60 mL/min    Comment: (NOTE) The eGFR has been calculated using the CKD EPI equation. This calculation has not  been validated in all clinical situations. eGFR's persistently <60 mL/min signify possible Chronic Kidney Disease.    Anion gap 19 (H) 5 - 15    Dg Chest 1 View  Result Date: 01/19/2017 CLINICAL DATA:  Left femur fracture.  Diabetes. EXAM: CHEST 1 VIEW COMPARISON:  None. FINDINGS: Single lead pacemaker in the region of the right ventricle. Cardiomegaly. Aortic atherosclerosis. Pulmonary venous hypertension without edema. No infiltrate, collapse or effusion. No acute bone finding. IMPRESSION: Single lead pacemaker. Cardiomegaly. Aortic atherosclerosis. Pulmonary venous hypertension. Electronically Signed   By: Lindsey Sutton M.D.   On: 01/19/2017 13:38   Dg Hip Unilat With Pelvis 2-3 Views Left  Result Date: 01/19/2017 CLINICAL DATA:  Fall.  Left hip pain. EXAM: DG HIP (WITH OR WITHOUT PELVIS) 2-3V LEFT COMPARISON:  No recent prior . FINDINGS: Diffuse osteopenia degenerative change. Fracture of the base of the left femoral neck appears to be present. No prominent  displacement. Aortoiliac atherosclerotic vascular calcification. IMPRESSION: 1. Nondisplaced fracture of the base of the left femoral neck appears to be present. 2. Severe osteopenia. Degenerative changes lumbar spine and both hips. 3. Aortoiliac atherosclerotic vascular disease. Electronically Signed   By: Lindsey Sutton  Lindsey Sutton   On: 01/19/2017 12:49    Review of Systems  Constitutional: Negative for weight loss.  HENT: Negative for ear discharge, ear pain, hearing loss and tinnitus.   Eyes: Negative for blurred vision, double vision, photophobia and pain.  Respiratory: Negative for cough, sputum production and shortness of breath.   Cardiovascular: Negative for chest pain.  Gastrointestinal: Negative for abdominal pain, nausea and vomiting.  Genitourinary: Negative for dysuria, flank pain, frequency and urgency.  Musculoskeletal: Positive for joint pain (Left hip). Negative for back pain, falls, myalgias and neck pain.  Neurological: Negative for dizziness, tingling, sensory change, focal weakness, loss of consciousness and headaches.  Endo/Heme/Allergies: Does not bruise/bleed easily.  Psychiatric/Behavioral: Negative for depression, memory loss and substance abuse. The patient is not nervous/anxious.    Blood pressure 137/87, pulse 75, temperature 97.6 F (36.4 C), temperature source Oral, resp. rate 20, height _0  (1.702 m), weight 96.6 kg (213 lb), SpO2 98 %. Physical Exam  Constitutional: She appears well-developed and well-nourished. No distress.  HENT:  Head: Normocephalic.  Eyes: Conjunctivae are normal. Right eye exhibits no discharge. Left eye exhibits no discharge. No scleral icterus.  Cardiovascular: Normal rate, regular rhythm and normal heart sounds.  Exam reveals no gallop and no friction rub.   No murmur heard. Respiratory: Effort normal and breath sounds normal. No respiratory distress. She has no wheezes. She has no rales.  GI: She exhibits distension and fluid wave.   Musculoskeletal:  Bilateral shoulder, elbow, wrist, digits- no skin wounds, nontender, no instability, no blocks to motion  Sens  Ax/R/M/U intact  Mot   Ax/ R/ PIN/ M/ AIN/ U intact  Rad 2+  RLE No traumatic wounds, ecchymosis, or rash  Nontender  No effusions  Knee stable to varus/ valgus and anterior/posterior stress  Sens DPN, SPN, TN insensate  Motor EHL, ext, flex, evers 5/5  DP 1+, PT 1+, No significant edema   LLE No traumatic wounds, ecchymosis, or rash  TTP hip  No effusions  Knee stable to varus/ valgus and anterior/posterior stress, ?TTP  Sens DPN, SPN, TN insensate  Motor EHL, ext, flex, evers limited  DP 1+, PT 1+, No significant edema  Lymphadenopathy:    She has no cervical adenopathy.  Skin: She is not diaphoretic.    Assessment/Plan: Fall  Left hip fx -- Will obtain CT to better visualize fx and plan fixation. Can hopefully arrange surgery this afternoon, continue NPO.  ESRD on dialysis -- T/T/S Ascites Bilateral LE neuropathy s/p pacemaker  Medicine to admit    Lisette Abu, PA-C Orthopedic Surgery [phone] 01/19/2017, 2:38 PM

## 2017-01-20 ENCOUNTER — Encounter (HOSPITAL_COMMUNITY): Payer: Self-pay | Admitting: *Deleted

## 2017-01-20 ENCOUNTER — Inpatient Hospital Stay (HOSPITAL_COMMUNITY): Payer: Medicare Other

## 2017-01-20 ENCOUNTER — Encounter (HOSPITAL_COMMUNITY)
Admission: EM | Disposition: A | Discharge status: Skilled Nursing Facility | Payer: Self-pay | Source: Home / Self Care | Attending: Internal Medicine

## 2017-01-20 ENCOUNTER — Inpatient Hospital Stay (HOSPITAL_COMMUNITY): Payer: Medicare Other | Admitting: Anesthesiology

## 2017-01-20 DIAGNOSIS — S72145A Nondisplaced intertrochanteric fracture of left femur, initial encounter for closed fracture: Secondary | ICD-10-CM

## 2017-01-20 DIAGNOSIS — D508 Other iron deficiency anemias: Secondary | ICD-10-CM

## 2017-01-20 DIAGNOSIS — Z950 Presence of cardiac pacemaker: Secondary | ICD-10-CM

## 2017-01-20 DIAGNOSIS — E669 Obesity, unspecified: Secondary | ICD-10-CM | POA: Diagnosis present

## 2017-01-20 HISTORY — DX: Obesity, unspecified: E66.9

## 2017-01-20 HISTORY — PX: INTRAMEDULLARY (IM) NAIL INTERTROCHANTERIC: SHX5875

## 2017-01-20 LAB — CBC
HCT: 32.5 % — ABNORMAL LOW (ref 36.0–46.0)
HEMATOCRIT: 31 % — AB (ref 36.0–46.0)
HEMOGLOBIN: 10.2 g/dL — AB (ref 12.0–15.0)
HEMOGLOBIN: 9.6 g/dL — AB (ref 12.0–15.0)
MCH: 28.1 pg (ref 26.0–34.0)
MCH: 28.1 pg (ref 26.0–34.0)
MCHC: 31.4 g/dL (ref 30.0–36.0)
MCHC: 31 g/dL (ref 30.0–36.0)
MCV: 89.5 fL (ref 78.0–100.0)
MCV: 90.6 fL (ref 78.0–100.0)
Platelets: 85 10*3/uL — ABNORMAL LOW (ref 150–400)
Platelets: 87 10*3/uL — ABNORMAL LOW (ref 150–400)
RBC: 3.63 MIL/uL — AB (ref 3.87–5.11)
RBC: 3.42 MIL/uL — AB (ref 3.87–5.11)
RDW: 16.9 % — ABNORMAL HIGH (ref 11.5–15.5)
RDW: 17 % — AB (ref 11.5–15.5)
WBC: 4 10*3/uL (ref 4.0–10.5)
WBC: 4.6 10*3/uL (ref 4.0–10.5)

## 2017-01-20 LAB — HEMOGLOBIN A1C
Hgb A1c MFr Bld: 5.4 % (ref 4.8–5.6)
MEAN PLASMA GLUCOSE: 108 mg/dL

## 2017-01-20 LAB — GLUCOSE, CAPILLARY
GLUCOSE-CAPILLARY: 104 mg/dL — AB (ref 65–99)
GLUCOSE-CAPILLARY: 86 mg/dL (ref 65–99)
GLUCOSE-CAPILLARY: 89 mg/dL (ref 65–99)
GLUCOSE-CAPILLARY: 109 mg/dL — AB (ref 65–99)
GLUCOSE-CAPILLARY: 146 mg/dL — AB (ref 65–99)

## 2017-01-20 LAB — BASIC METABOLIC PANEL
ANION GAP: 16 — AB (ref 5–15)
BUN: 71 mg/dL — ABNORMAL HIGH (ref 6–20)
CHLORIDE: 93 mmol/L — AB (ref 101–111)
CO2: 20 mmol/L — AB (ref 22–32)
CREATININE: 7.25 mg/dL — AB (ref 0.44–1.00)
Calcium: 8.8 mg/dL — ABNORMAL LOW (ref 8.9–10.3)
GFR calc non Af Amer: 5 mL/min — ABNORMAL LOW (ref >60)
GFR, EST AFRICAN AMERICAN: 6 mL/min — AB (ref >60)
Glucose, Bld: 101 mg/dL — ABNORMAL HIGH (ref 65–99)
POTASSIUM: 4.4 mmol/L (ref 3.5–5.1)
SODIUM: 129 mmol/L — AB (ref 135–145)

## 2017-01-20 LAB — PHOSPHORUS: Phosphorus: 9.7 mg/dL — ABNORMAL HIGH (ref 2.5–4.6)

## 2017-01-20 LAB — CREATININE, SERUM
Creatinine, Ser: 5.19 mg/dL — ABNORMAL HIGH (ref 0.44–1.00)
GFR, EST AFRICAN AMERICAN: 9 mL/min — AB (ref >60)
GFR, EST NON AFRICAN AMERICAN: 8 mL/min — AB (ref >60)

## 2017-01-20 LAB — ALBUMIN: Albumin: 3.4 g/dL — ABNORMAL LOW (ref 3.5–5.0)

## 2017-01-20 SURGERY — FIXATION, FRACTURE, INTERTROCHANTERIC, WITH INTRAMEDULLARY ROD
Anesthesia: General | Laterality: Left

## 2017-01-20 MED ORDER — SUGAMMADEX SODIUM 200 MG/2ML IV SOLN
INTRAVENOUS | Status: AC
  Filled 2017-01-20: qty 2

## 2017-01-20 MED ORDER — CLINDAMYCIN PHOSPHATE 600 MG/50ML IV SOLN
600.0000 mg | Freq: Four times a day (QID) | INTRAVENOUS | Status: AC
  Administered 2017-01-20 – 2017-01-21 (×2): 600 mg via INTRAVENOUS
  Filled 2017-01-20 (×2): qty 50

## 2017-01-20 MED ORDER — SODIUM CHLORIDE 0.9 % IV SOLN
INTRAVENOUS | Status: DC
  Administered 2017-01-20 – 2017-01-21 (×2): via INTRAVENOUS

## 2017-01-20 MED ORDER — LIDOCAINE HCL (CARDIAC) 20 MG/ML IV SOLN
INTRAVENOUS | Status: DC | PRN
  Administered 2017-01-20: 50 mg via INTRAVENOUS

## 2017-01-20 MED ORDER — CLINDAMYCIN PHOSPHATE 900 MG/50ML IV SOLN: 900.0000 mg | INTRAVENOUS | Status: DC

## 2017-01-20 MED ORDER — FENTANYL CITRATE (PF) 100 MCG/2ML IJ SOLN
INTRAMUSCULAR | Status: DC | PRN
  Administered 2017-01-20 (×2): 25 ug via INTRAVENOUS
  Administered 2017-01-20: 50 ug via INTRAVENOUS

## 2017-01-20 MED ORDER — SODIUM CHLORIDE 0.9 % IV SOLN: 100.0000 mL | INTRAVENOUS | Status: DC | PRN

## 2017-01-20 MED ORDER — LIDOCAINE HCL (PF) 1 % IJ SOLN: 5.0000 mL | INTRAMUSCULAR | Status: DC | PRN

## 2017-01-20 MED ORDER — ACETAMINOPHEN 325 MG PO TABS
650.0000 mg | ORAL_TABLET | Freq: Four times a day (QID) | ORAL | Status: DC | PRN
  Administered 2017-01-23 – 2017-01-24 (×3): 650 mg via ORAL
  Filled 2017-01-20 (×2): qty 2

## 2017-01-20 MED ORDER — ACETAMINOPHEN 650 MG RE SUPP: 650.0000 mg | Freq: Four times a day (QID) | RECTAL | Status: DC | PRN

## 2017-01-20 MED ORDER — ALTEPLASE 2 MG IJ SOLR
2.0000 mg | Freq: Once | INTRAMUSCULAR | Status: DC | PRN
Start: 2017-01-20 — End: 2017-01-20

## 2017-01-20 MED ORDER — HEPARIN SODIUM (PORCINE) 1000 UNIT/ML DIALYSIS: 1000.0000 [IU] | INTRAMUSCULAR | Status: DC | PRN

## 2017-01-20 MED ORDER — DEXAMETHASONE SODIUM PHOSPHATE 10 MG/ML IJ SOLN
INTRAMUSCULAR | Status: AC
  Filled 2017-01-20: qty 1

## 2017-01-20 MED ORDER — LIDOCAINE 2% (20 MG/ML) 5 ML SYRINGE
INTRAMUSCULAR | Status: AC
  Filled 2017-01-20: qty 5

## 2017-01-20 MED ORDER — EPHEDRINE SULFATE 50 MG/ML IJ SOLN
INTRAMUSCULAR | Status: DC | PRN
  Administered 2017-01-20: 10 mg via INTRAVENOUS

## 2017-01-20 MED ORDER — ROCURONIUM BROMIDE 100 MG/10ML IV SOLN
INTRAVENOUS | Status: DC | PRN
  Administered 2017-01-20: 30 mg via INTRAVENOUS

## 2017-01-20 MED ORDER — MORPHINE SULFATE (PF) 4 MG/ML IV SOLN: 0.5000 mg | INTRAVENOUS | Status: DC | PRN

## 2017-01-20 MED ORDER — ENOXAPARIN SODIUM 30 MG/0.3ML ~~LOC~~ SOLN: 30.0000 mg | SUBCUTANEOUS | 0 refills | Status: DC

## 2017-01-20 MED ORDER — ONDANSETRON HCL 4 MG PO TABS: 4.0000 mg | ORAL_TABLET | Freq: Four times a day (QID) | ORAL | Status: DC | PRN

## 2017-01-20 MED ORDER — ONDANSETRON HCL 4 MG/2ML IJ SOLN
INTRAMUSCULAR | Status: DC | PRN
  Administered 2017-01-20: 4 mg via INTRAVENOUS

## 2017-01-20 MED ORDER — NEPRO/CARBSTEADY PO LIQD
237.0000 mL | Freq: Every day | ORAL | Status: DC
  Administered 2017-01-23: 237 mL via ORAL

## 2017-01-20 MED ORDER — OXYCODONE HCL 5 MG PO TABS
5.0000 mg | ORAL_TABLET | ORAL | Status: DC | PRN
  Administered 2017-01-20 – 2017-01-22 (×6): 10 mg via ORAL
  Administered 2017-01-23: 5 mg via ORAL
  Administered 2017-01-23: 10 mg via ORAL
  Filled 2017-01-20: qty 1
  Filled 2017-01-20 (×7): qty 2

## 2017-01-20 MED ORDER — ARTIFICIAL TEARS OPHTHALMIC OINT
TOPICAL_OINTMENT | OPHTHALMIC | Status: DC | PRN
  Administered 2017-01-20: 1 via OPHTHALMIC

## 2017-01-20 MED ORDER — ENOXAPARIN SODIUM 30 MG/0.3ML ~~LOC~~ SOLN
30.0000 mg | SUBCUTANEOUS | Status: DC
  Administered 2017-01-21 – 2017-01-22 (×2): 30 mg via SUBCUTANEOUS
  Filled 2017-01-20 (×2): qty 0.3

## 2017-01-20 MED ORDER — OXYCODONE-ACETAMINOPHEN 5-325 MG PO TABS: 1.0000 | ORAL_TABLET | ORAL | 0 refills | Status: DC | PRN

## 2017-01-20 MED ORDER — HEPARIN SODIUM (PORCINE) 1000 UNIT/ML DIALYSIS
1000.0000 [IU] | INTRAMUSCULAR | Status: DC | PRN
  Filled 2017-01-20: qty 1

## 2017-01-20 MED ORDER — SUGAMMADEX SODIUM 200 MG/2ML IV SOLN
INTRAVENOUS | Status: DC | PRN
  Administered 2017-01-20: 200 mg via INTRAVENOUS

## 2017-01-20 MED ORDER — FENTANYL CITRATE (PF) 100 MCG/2ML IJ SOLN
25.0000 ug | INTRAMUSCULAR | Status: DC | PRN
  Administered 2017-01-20: 25 ug via INTRAVENOUS

## 2017-01-20 MED ORDER — SODIUM CHLORIDE 0.9 % IV SOLN
INTRAVENOUS | Status: DC
  Administered 2017-01-20: 15:00:00 via INTRAVENOUS

## 2017-01-20 MED ORDER — FENTANYL CITRATE (PF) 250 MCG/5ML IJ SOLN
INTRAMUSCULAR | Status: AC
  Filled 2017-01-20: qty 5

## 2017-01-20 MED ORDER — ARTIFICIAL TEARS OPHTHALMIC OINT
TOPICAL_OINTMENT | OPHTHALMIC | Status: AC
  Filled 2017-01-20: qty 3.5

## 2017-01-20 MED ORDER — PENTAFLUOROPROP-TETRAFLUOROETH EX AERO: 1.0000 "application " | INHALATION_SPRAY | CUTANEOUS | Status: DC | PRN

## 2017-01-20 MED ORDER — DEXAMETHASONE SODIUM PHOSPHATE 10 MG/ML IJ SOLN
INTRAMUSCULAR | Status: DC | PRN
  Administered 2017-01-20: 10 mg via INTRAVENOUS

## 2017-01-20 MED ORDER — ONDANSETRON HCL 4 MG/2ML IJ SOLN
4.0000 mg | Freq: Four times a day (QID) | INTRAMUSCULAR | Status: DC | PRN
  Administered 2017-01-24 – 2017-01-25 (×4): 4 mg via INTRAVENOUS
  Filled 2017-01-20 (×4): qty 2

## 2017-01-20 MED ORDER — HYDROCODONE-ACETAMINOPHEN 5-325 MG PO TABS
1.0000 | ORAL_TABLET | Freq: Four times a day (QID) | ORAL | Status: DC | PRN
  Administered 2017-01-20 – 2017-01-22 (×7): 2 via ORAL
  Filled 2017-01-20 (×7): qty 2

## 2017-01-20 MED ORDER — MORPHINE SULFATE (PF) 4 MG/ML IV SOLN
INTRAVENOUS | Status: AC
  Filled 2017-01-20: qty 1

## 2017-01-20 MED ORDER — METHOCARBAMOL 500 MG PO TABS
500.0000 mg | ORAL_TABLET | Freq: Four times a day (QID) | ORAL | Status: DC | PRN
  Administered 2017-01-21 – 2017-01-23 (×7): 500 mg via ORAL
  Filled 2017-01-20 (×7): qty 1

## 2017-01-20 MED ORDER — PHENOL 1.4 % MT LIQD: 1.0000 | OROMUCOSAL | Status: DC | PRN

## 2017-01-20 MED ORDER — OXYCODONE HCL 5 MG PO TABS
5.0000 mg | ORAL_TABLET | Freq: Once | ORAL | Status: DC | PRN
Start: 2017-01-20 — End: 2017-01-20

## 2017-01-20 MED ORDER — ALUM & MAG HYDROXIDE-SIMETH 200-200-20 MG/5ML PO SUSP: 30.0000 mL | ORAL | Status: DC | PRN

## 2017-01-20 MED ORDER — METOCLOPRAMIDE HCL 5 MG/ML IJ SOLN
5.0000 mg | Freq: Three times a day (TID) | INTRAMUSCULAR | Status: DC | PRN
  Administered 2017-01-24 – 2017-01-25 (×3): 5 mg via INTRAVENOUS
  Filled 2017-01-20 (×3): qty 2

## 2017-01-20 MED ORDER — ROCURONIUM BROMIDE 10 MG/ML (PF) SYRINGE
PREFILLED_SYRINGE | INTRAVENOUS | Status: AC
  Filled 2017-01-20: qty 5

## 2017-01-20 MED ORDER — PHENYLEPHRINE HCL 10 MG/ML IJ SOLN
INTRAVENOUS | Status: DC | PRN
  Administered 2017-01-20: 25 ug/min via INTRAVENOUS

## 2017-01-20 MED ORDER — EPHEDRINE 5 MG/ML INJ
INTRAVENOUS | Status: AC
  Filled 2017-01-20: qty 10

## 2017-01-20 MED ORDER — POVIDONE-IODINE 10 % EX SWAB: 2.0000 "application " | Freq: Once | CUTANEOUS | Status: DC

## 2017-01-20 MED ORDER — PROPOFOL 10 MG/ML IV BOLUS
INTRAVENOUS | Status: AC
  Filled 2017-01-20: qty 40

## 2017-01-20 MED ORDER — METOCLOPRAMIDE HCL 5 MG PO TABS
5.0000 mg | ORAL_TABLET | Freq: Three times a day (TID) | ORAL | Status: DC | PRN
  Filled 2017-01-20: qty 1

## 2017-01-20 MED ORDER — OXYCODONE HCL 5 MG/5ML PO SOLN: 5.0000 mg | Freq: Once | ORAL | Status: DC | PRN

## 2017-01-20 MED ORDER — ONDANSETRON HCL 4 MG/2ML IJ SOLN: 4.0000 mg | Freq: Four times a day (QID) | INTRAMUSCULAR | Status: DC | PRN

## 2017-01-20 MED ORDER — METHOCARBAMOL 1000 MG/10ML IJ SOLN
500.0000 mg | Freq: Four times a day (QID) | INTRAMUSCULAR | Status: DC | PRN
  Filled 2017-01-20: qty 5

## 2017-01-20 MED ORDER — 0.9 % SODIUM CHLORIDE (POUR BTL) OPTIME
TOPICAL | Status: DC | PRN
  Administered 2017-01-20: 1000 mL

## 2017-01-20 MED ORDER — FENTANYL CITRATE (PF) 100 MCG/2ML IJ SOLN
INTRAMUSCULAR | Status: AC
  Filled 2017-01-20: qty 2

## 2017-01-20 MED ORDER — LIDOCAINE-PRILOCAINE 2.5-2.5 % EX CREA
1.0000 "application " | TOPICAL_CREAM | CUTANEOUS | Status: DC | PRN
  Filled 2017-01-20: qty 5

## 2017-01-20 MED ORDER — ONDANSETRON HCL 4 MG/2ML IJ SOLN
INTRAMUSCULAR | Status: AC
  Filled 2017-01-20: qty 2

## 2017-01-20 MED ORDER — LIDOCAINE-PRILOCAINE 2.5-2.5 % EX CREA
1.0000 | TOPICAL_CREAM | CUTANEOUS | Status: DC | PRN
Start: 2017-01-20 — End: 2017-01-20

## 2017-01-20 MED ORDER — PROPOFOL 10 MG/ML IV BOLUS
INTRAVENOUS | Status: DC | PRN
  Administered 2017-01-20: 100 mg via INTRAVENOUS

## 2017-01-20 MED ORDER — MENTHOL 3 MG MT LOZG: 1.0000 | LOZENGE | OROMUCOSAL | Status: DC | PRN

## 2017-01-20 SURGICAL SUPPLY — 36 items
BNDG COHESIVE 4X5 TAN NS LF (GAUZE/BANDAGES/DRESSINGS) ×3 IMPLANT
BNDG COHESIVE 6X5 TAN STRL LF (GAUZE/BANDAGES/DRESSINGS) IMPLANT
BNDG GAUZE ELAST 4 BULKY (GAUZE/BANDAGES/DRESSINGS) IMPLANT
COVER PERINEAL POST (MISCELLANEOUS) ×3 IMPLANT
COVER SURGICAL LIGHT HANDLE (MISCELLANEOUS) ×3 IMPLANT
DRAPE STERI IOBAN 125X83 (DRAPES) ×3 IMPLANT
DRSG MEPILEX BORDER 4X4 (GAUZE/BANDAGES/DRESSINGS) ×3 IMPLANT
DRSG MEPILEX BORDER 4X8 (GAUZE/BANDAGES/DRESSINGS) IMPLANT
DRSG PAD ABDOMINAL 8X10 ST (GAUZE/BANDAGES/DRESSINGS) IMPLANT
DURAPREP 26ML APPLICATOR (WOUND CARE) ×3 IMPLANT
ELECT REM PT RETURN 9FT ADLT (ELECTROSURGICAL) ×3
ELECTRODE REM PT RTRN 9FT ADLT (ELECTROSURGICAL) ×1 IMPLANT
GLOVE SKINSENSE NS SZ7.5 (GLOVE) ×2
GLOVE SKINSENSE STRL SZ7.5 (GLOVE) ×1 IMPLANT
GOWN STRL REIN XL XLG (GOWN DISPOSABLE) ×3 IMPLANT
GUIDE PIN 3.2X343 (PIN) ×1
GUIDE PIN 3.2X343MM (PIN) ×2
KIT BASIN OR (CUSTOM PROCEDURE TRAY) ×3 IMPLANT
KIT ROOM TURNOVER OR (KITS) ×3 IMPLANT
MANIFOLD NEPTUNE II (INSTRUMENTS) IMPLANT
NAIL TRIGEN LEFT 11.5X38-125 (Nail) ×3 IMPLANT
NS IRRIG 1000ML POUR BTL (IV SOLUTION) ×3 IMPLANT
PACK GENERAL/GYN (CUSTOM PROCEDURE TRAY) ×3 IMPLANT
PAD ARMBOARD 7.5X6 YLW CONV (MISCELLANEOUS) ×6 IMPLANT
PAD CAST 4YDX4 CTTN HI CHSV (CAST SUPPLIES) IMPLANT
PADDING CAST COTTON 4X4 STRL (CAST SUPPLIES)
PIN GUIDE 3.2X343MM (PIN) ×1 IMPLANT
SCREW LAG COMPR KIT 95/90 (Screw) ×3 IMPLANT
STAPLER VISISTAT 35W (STAPLE) ×3 IMPLANT
SUT VIC AB 0 CT1 27 (SUTURE) ×2
SUT VIC AB 0 CT1 27XBRD ANBCTR (SUTURE) ×1 IMPLANT
SUT VIC AB 2-0 CT1 27 (SUTURE) ×2
SUT VIC AB 2-0 CT1 TAPERPNT 27 (SUTURE) ×1 IMPLANT
TOWEL OR 17X24 6PK STRL BLUE (TOWEL DISPOSABLE) IMPLANT
TOWEL OR 17X26 10 PK STRL BLUE (TOWEL DISPOSABLE) ×3 IMPLANT
WATER STERILE IRR 1000ML POUR (IV SOLUTION) ×3 IMPLANT

## 2017-01-20 NOTE — Anesthesia Preprocedure Evaluation (Signed)
Anesthesia Evaluation  Patient identified by MRN, date of birth, ID band Patient awake    Reviewed: Allergy & Precautions, H&P , NPO status , Patient's Chart, lab work & pertinent test results  Airway Mallampati: II   Neck ROM: full    Dental   Pulmonary former smoker,    breath sounds clear to auscultation       Cardiovascular negative cardio ROS  + pacemaker  Rhythm:regular Rate:Normal     Neuro/Psych    GI/Hepatic   Endo/Other  diabetes, Type 2  Renal/GU Renal InsufficiencyRenal disease     Musculoskeletal   Abdominal   Peds  Hematology  (+) Blood dyscrasia, anemia ,   Anesthesia Other Findings   Reproductive/Obstetrics                             Anesthesia Physical Anesthesia Plan  ASA: III  Anesthesia Plan: General   Post-op Pain Management:    Induction: Intravenous  Airway Management Planned: Oral ETT  Additional Equipment:   Intra-op Plan:   Post-operative Plan: Extubation in OR  Informed Consent: I have reviewed the patients History and Physical, chart, labs and discussed the procedure including the risks, benefits and alternatives for the proposed anesthesia with the patient or authorized representative who has indicated his/her understanding and acceptance.     Plan Discussed with: CRNA, Anesthesiologist and Surgeon  Anesthesia Plan Comments:         Anesthesia Quick Evaluation

## 2017-01-20 NOTE — Anesthesia Procedure Notes (Signed)
Procedure Name: Intubation Date/Time: 01/20/2017 3:44 PM Performed by: Jacquiline Doe A Pre-anesthesia Checklist: Patient identified, Emergency Drugs available, Suction available and Patient being monitored Patient Re-evaluated:Patient Re-evaluated prior to inductionOxygen Delivery Method: Circle System Utilized and Circle system utilized Preoxygenation: Pre-oxygenation with 100% oxygen Intubation Type: IV induction Ventilation: Mask ventilation without difficulty and Oral airway inserted - appropriate to patient size Laryngoscope Size: Mac and 4 Grade View: Grade I Tube type: Oral Tube size: 7.0 mm Number of attempts: 1 Airway Equipment and Method: Stylet and Oral airway Placement Confirmation: ETT inserted through vocal cords under direct vision,  positive ETCO2 and breath sounds checked- equal and bilateral Secured at: 22 cm Tube secured with: Tape Dental Injury: Teeth and Oropharynx as per pre-operative assessment

## 2017-01-20 NOTE — Progress Notes (Signed)
Initial Nutrition Assessment  DOCUMENTATION CODES:   Obesity unspecified  INTERVENTION:  Once diet advances, provide Nepro Shake po once daily, each supplement provides 425 kcal and 19 grams protein.  NUTRITION DIAGNOSIS:   Increased nutrient needs related to chronic illness as evidenced by estimated needs.  GOAL:   Patient will meet greater than or equal to 90% of their needs  MONITOR:   PO intake, Supplement acceptance, Labs, Weight trends, Skin, I & O's  REASON FOR ASSESSMENT:   Consult Assessment of nutrition requirement/status  ASSESSMENT:   71 y.o. female with medical history significant end-stage renal disease a Tuesday Thursday Saturday dialysis patient, A. fib status post pacemaker, iron deficiency anemia, diabetes, chronic kidney disease, anxiety disorder presents to the emergency department with the chief complaint of left hip pain related to a mechanical fall. Initial evaluation reveals left hip fracture.   Pt is currently NPO for surgery today. Pt reports eating well PTA with usual consumption of at least 2-3 meals a day with no other difficulties. Pt reports weight has been stable. RD to order nutritional supplements to aid in post op healing.   Pt with no observed significant fat or muscle mass loss.   Labs and medications reviewed. Phosphorous elevated at 9.7.  Diet Order:  Diet NPO time specified Diet NPO time specified Except for: Sips with Meds  Skin:  Reviewed, no issues  Last BM:  5/2  Height:   Ht Readings from Last 1 Encounters:  01/19/17 5\' 7"  (1.702 m)    Weight:   Wt Readings from Last 1 Encounters:  01/20/17 216 lb 0.8 oz (98 kg)    Ideal Body Weight:  61.36 kg  BMI:  Body mass index is 33.84 kg/m.  Estimated Nutritional Needs:   Kcal:  1900-2050  Protein:  105-125 grams  Fluid:  Per MD  EDUCATION NEEDS:   No education needs identified at this time  Roslyn SmilingStephanie Cope Marte, MS, RD, LDN Pager # [phone] After hours/ weekend  pager # [phone]

## 2017-01-20 NOTE — Progress Notes (Signed)
Lindsey Sutton Progress Note   Subjective: on HD, plan is to go to OR after HD for hip surgery.  No c/o's.  Not tolerating any UF , BP's dropping into 80's.   Vitals:   01/20/17 0835 01/20/17 0900 01/20/17 0930 01/20/17 1000  BP: (!) 80/51 (!) 89/60 (!) 95/54 94/65  Pulse: 61 61 61 61  Resp: 14 15 15 16   Temp:      TempSrc:      SpO2: 98% 100% 100% 99%  Weight:      Height:        Inpatient medications: . [START ON 01/21/2017] calcitRIOL  1 mcg Oral Q T,Th,Sa-HD  . chlorhexidine  60 mL Topical Once  . [START ON 01/21/2017] cinacalcet  60 mg Oral Q T,Th,Sa-HD  . cloNIDine  0.3 mg Oral TID  . docusate sodium  100 mg Oral BID  . folic acid  1 mg Oral BID  . furosemide  40 mg Oral BID  . insulin aspart  0-5 Units Subcutaneous QHS  . insulin aspart  0-9 Units Subcutaneous TID WC  . mometasone-formoterol  2 puff Inhalation BID  . povidone-iodine  2 application Topical Once  . povidone-iodine  2 application Topical Once  . sevelamer carbonate  800 mg Oral TID WC  . traZODone  50 mg Oral QHS   . sodium chloride    . sodium chloride    . clindamycin (CLEOCIN) IV    . ferric gluconate (FERRLECIT/NULECIT) IV     sodium chloride, sodium chloride, alteplase, diphenhydrAMINE, heparin, HYDROcodone-acetaminophen, lidocaine (PF), lidocaine-prilocaine, morphine injection, pentafluoroprop-tetrafluoroeth, zolpidem  Exam: Obese, frail, no distress, on HD NO jvd Chest clear bilat to bases Irreg irreg, afib on monitor, no mrg abd sig obese , soft ntnd Ext no LE edema, no UE edema A&Ox 3, NF LFA aVF +bruit  Dialysis: [facility] 4h  94kg   2/2 bath   450/800  Hep 5000   LFA AVF -Sensipar 30 mg PO TIW (Last PTH 1453 01/14/17) -Calcitriol 1.0 mcg PO TIW -Mircera 225 mcg IV q 2 weeks (last dose 01/10/17 HGB 10.5 01/14/17) -Venofer 50 mg IV weekly (last dose 01/14/17 Ferritin 977 Fe 42 TSat 19% 01/14/17)       Assessment: 1. L femoral neck Fx: to OR today after HD 2. ESRD on  TTS HD, missed yest. HD today and tomorrow.  3. HTN - on Clonidine po as outpt. Cont here as tol 4. Volume - up 4kg by wts but no vol excess on exam 5. Anemia  -HGB 10.2 ESA due next week. Follow HGB post op. Last OP T Sat 19% Load with  Fe with HD tomorrow X 5 doses.  6. Metabolic bone disease - Continue sensipar, binders, VDRA. 7. Nutrition - NPO at present 8. DM: per primary.   Plan - as above   Vinson Moselleob Rubens Cranston MD [facility] pager [phone]   01/20/2017, 10:17 AM    Recent Labs Lab 01/19/17 1147 01/20/17 0608 01/20/17 0613  NA 131* 129*  --   K 4.1 4.4  --   CL 94* 93*  --   CO2 18* 20*  --   GLUCOSE 115* 101*  --   BUN 65* 71*  --   CREATININE 6.64* 7.25*  --   CALCIUM 8.9 8.8*  --   PHOS  --   --  9.7*    Recent Labs Lab 01/19/17 1630 01/20/17 0613  AST 15  --   ALT 8*  --  ALKPHOS 65  --   BILITOT 1.3*  --   PROT 7.0  --   ALBUMIN 3.7 3.4*    Recent Labs Lab 01/19/17 1147 01/20/17 0608  WBC 3.6* 4.0  NEUTROABS 2.7  --   HGB 10.2* 10.2*  HCT 32.4* 32.5*  MCV 89.3 89.5  PLT 102* 85*   Iron/TIBC/Ferritin/ %Sat No results found for: IRON, TIBC, FERRITIN, IRONPCTSAT

## 2017-01-20 NOTE — H&P (Signed)
H&P update  The surgical history has been reviewed and remains accurate without interval change.  The patient was re-examined and patient's physiologic condition has not changed significantly in the last 30 days. The condition still exists that makes this procedure necessary. The treatment plan remains the same, without new options for care.  No new pharmacological allergies or types of therapy has been initiated that would change the plan or the appropriateness of the plan.  The patient and/or family understand the potential benefits and risks.  Mayra ReelN. Michael Jermel Artley, MD 01/20/2017 7:20 AM

## 2017-01-20 NOTE — Op Note (Signed)
   Date of Surgery: 01/20/2017  INDICATIONS: Ms. Lindsey Sutton is a 71 y.o.-year-old female who sustained a left hip fracture. The risks and benefits of the procedure discussed with the patient prior to the procedure and all questions were answered; consent was obtained.  PREOPERATIVE DIAGNOSIS: left intertrochanteric hip fracture   POSTOPERATIVE DIAGNOSIS: Same   PROCEDURE: Treatment of intertrochanteric fracture with intramedullary implant. CPT [id number]   SURGEON: N. Glee ArvinMichael Xu, M.D.   ANESTHESIA: general   IV FLUIDS AND URINE: See anesthesia record   ESTIMATED BLOOD LOSS: 200 cc  IMPLANTS: [employer] InterTAN 11.5 x 38, 95/90  DRAINS: None.   COMPLICATIONS: None.   DESCRIPTION OF PROCEDURE: The patient was brought to the operating room and placed supine on the operating table. The patient's leg had been signed prior to the procedure. The patient had the anesthesia placed by the anesthesiologist. The prep verification and incision time-outs were performed to confirm that this was the correct patient, site, side and location. The patient had an SCD on the opposite lower extremity. The patient did receive antibiotics prior to the incision and was re-dosed during the procedure as needed at indicated intervals. The patient was positioned on the fracture table with the table in traction and internal rotation to reduce the hip. The well leg was placed in a scissor position and all bony prominences were well-padded. The patient had the lower extremity prepped and draped in the standard surgical fashion. The incision was made 4 finger breadths superior to the greater trochanter. A guide pin was inserted into the tip of the greater trochanter under fluoroscopic guidance. An opening reamer was used to gain access to the femoral canal. The nail length was measured and inserted down the femoral canal to its proper depth. The appropriate version of insertion for the lag screw was found under fluoroscopy. A  pin was inserted up the femoral neck through the jig. Then, a second antirotation pin was inserted inferior to the first pin. The length of the lag screw was then measured. The lag screw was inserted as near to center-center in the head as possible. The antirotation pin was then taken out and an interdigitating compression screw was placed in its place. The leg was taken out of traction, then the interdigitating compression screw was used to compress across the fracture. Compression was visualized on serial xrays. The wound was copiously irrigated with saline and the subcutaneous layer closed with 2.0 vicryl and the skin was reapproximated with staples. The wounds were cleaned and dried a final time and a sterile dressing was placed. The hip was taken through a range of motion at the end of the case under fluoroscopic imaging to visualize the approach-withdraw phenomenon and confirm implant length in the head. The patient was then awakened from anesthesia and taken to the recovery room in stable condition. All counts were correct at the end of the case.   POSTOPERATIVE PLAN: The patient will be weight bearing as tolerated and will return in 2 weeks for staple removal and the patient will receive DVT prophylaxis based on other medications, activity level, and risk ratio of bleeding to thrombosis.   Mayra ReelN. Michael Xu, MD [facility] [phone] 4:32 PM

## 2017-01-20 NOTE — Discharge Instructions (Signed)
° ° °  1. Change dressings as needed °2. May shower but keep incisions covered and dry °3. Take lovenox to prevent blood clots °4. Take stool softeners as needed °5. Take pain meds as needed ° °

## 2017-01-20 NOTE — Transfer of Care (Signed)
Immediate Anesthesia Transfer of Care Note  Patient: Lindsey Sutton  Procedure(s) Performed: Procedure(s): INTRAMEDULLARY (IM) NAIL INTERTROCHANTRIC (Left)  Patient Location: PACU  Anesthesia Type:General  Level of Consciousness: awake, oriented, sedated, patient cooperative and responds to stimulation  Airway & Oxygen Therapy: Patient Spontanous Breathing and Patient connected to nasal cannula oxygen  Post-op Assessment: Report given to RN and Post -op Vital signs reviewed and stable  Post vital signs: Reviewed and stable  Last Vitals:  Vitals:   01/20/17 1135 01/20/17 1245  BP: (!) 116/55 103/61  Pulse: 61 89  Resp: 18 18  Temp: 36.8 C 36.5 C    Last Pain:  Vitals:   01/20/17 1245  TempSrc: Oral  PainSc:       Patients Stated Pain Goal: 3 (01/20/17 0031)  Complications: No apparent anesthesia complications

## 2017-01-20 NOTE — Anesthesia Procedure Notes (Signed)
Procedures

## 2017-01-20 NOTE — Progress Notes (Signed)
Progress Note    Lindsey Sutton  ZOX:[id number] DOB: August 06, 1946  DOA: 01/19/2017 PCP: [name], NP    Brief Narrative:   Chief complaint: Follow-up left hip fracture  Medical records reviewed and are as summarized below:  Lindsey Sutton is an 71 y.o. female with a PMH of ESRD on HD, atrial fibrillation status post pacemaker, chronic iron deficiency anemia, diabetes, and anxiety who was admitted 01/19/17 with chief complaint of left hip pain after suffering from a mechanical fall resulting in a hip fracture.  Assessment/Plan:   Principal Problem:   Hip fx (HCC)/Nondisplaced intertrochanteric fracture of the left femur Surgery tentatively scheduled for today.Medically optimized for surgery.  Active Problems:   Atrial fibrillation status post Pacemaker CHADS2Vasc = 4. Not on chronic anticoagulation. 2-D echo pending.    Iron deficiency anemia Hemoglobin stable, 10.2 mg/dL.    Diabetes mellitus with renal complications Hemoglobin A1c 5.4%. Glipizide on hold.Currently being managed with insulin sensitive SSI Q AC/HS. CBGs 104-119.    Systolic heart failure (HCC) Cardiomegaly on chest x-ray. Appears compensated. 2-D echo pending.    ESRD (end stage renal disease) (HCC) Hemodialysis today. Nephrology following.    Ascites/fatty liver disease History of paracentesis in the past. Right upper quadrant ultrasound ordered showed a fatty liver.   Obesity (BMI 30-39.9) Body mass index is 33.36 kg/m.   Family Communication/Anticipated D/C date and plan/Code Status   DVT prophylaxis: Heparin ordered. Code Status: Full Code.  Family Communication: Multiple family updated at the bedside. Disposition Plan: Likely will need SNF.   Medical Consultants:    Orthopedic Surgery  Nephrology   Procedures:    None  Anti-Infectives:    None  Subjective:   Patient is a bit sedated after receiving pain medications for left hip pain. No nausea or vomiting. No  chest pain or dyspnea.  Objective:    Vitals:   01/19/17 1900 01/19/17 2013 01/19/17 2018 01/20/17 0557  BP: (!) 146/78  (!) 142/71 109/61  Pulse: 80  69 (!) 59  Resp: 18  16 18   Temp: 98.2 F (36.8 C)  98.6 F (37 C) 97.7 F (36.5 C)  TempSrc: Oral  Oral Oral  SpO2: 97% 98% 98% 98%  Weight:      Height:       No intake or output data in the 24 hours ending 01/20/17 0740 Filed Weights   01/19/17 1119  Weight: 96.6 kg (213 lb)    Exam: General exam: Appears Drowsy. Respiratory system: Clear to auscultation, decreased breath sounds. Respiratory effort normal. Cardiovascular system: HSIR. No JVD,  rubs, gallops or clicks. II/VI murmur. Gastrointestinal system: Abdomen is softly ndistended, nontender. No organomegaly or masses felt. Normal bowel sounds heard. Central nervous system: Drowsy. No focal neurological deficits. Extremities: No clubbing, or cyanosis. Left wrist AVG with +bruit. Skin: No rashes, lesions or ulcers. Psychiatry: Judgement and insight appear normal. Mood & affect flat.   Data Reviewed:   I have personally reviewed following labs and imaging studies:  Labs: Basic Metabolic Panel:  Recent Labs Lab 01/19/17 1147 01/20/17 0608  NA 131* 129*  K 4.1 4.4  CL 94* 93*  CO2 18* 20*  GLUCOSE 115* 101*  BUN 65* 71*  CREATININE 6.64* 7.25*  CALCIUM 8.9 8.8*   GFR Estimated Creatinine Clearance: 8.6 mL/min (A) (by C-G formula based on SCr of 7.25 mg/dL (H)). Liver Function Tests:  Recent Labs Lab 01/19/17 1630  AST 15  ALT 8*  ALKPHOS 65  BILITOT 1.3*  PROT 7.0  ALBUMIN 3.7   No results for input(s): LIPASE, AMYLASE in the last 168 hours. No results for input(s): AMMONIA in the last 168 hours. Coagulation profile  Recent Labs Lab 01/19/17 1147  INR 1.35    CBC:  Recent Labs Lab 01/19/17 1147 01/20/17 0608  WBC 3.6* 4.0  NEUTROABS 2.7  --   HGB 10.2* 10.2*  HCT 32.4* 32.5*  MCV 89.3 89.5  PLT 102* 85*   Cardiac  Enzymes: No results for input(s): CKTOTAL, CKMB, CKMBINDEX, TROPONINI in the last 168 hours. BNP (last 3 results) No results for input(s): PROBNP in the last 8760 hours. CBG:  Recent Labs Lab 01/19/17 1621 01/19/17 2022 01/20/17 0616  GLUCAP 119* 109* 104*   D-Dimer: No results for input(s): DDIMER in the last 72 hours. Hgb A1c:  Recent Labs  01/19/17 1147  HGBA1C 5.4    Microbiology Recent Results (from the past 240 hour(s))  Surgical pcr screen     Status: None   Collection Time: 01/19/17  5:10 PM  Result Value Ref Range Status   MRSA, PCR NEGATIVE NEGATIVE Final   Staphylococcus aureus NEGATIVE NEGATIVE Final    Comment:        The Xpert SA Assay (FDA approved for NASAL specimens in patients over 62 years of age), is one component of a comprehensive surveillance program.  Test performance has been validated by [facility] for patients greater than or equal to 40 year old. It is not intended to diagnose infection nor to guide or monitor treatment.     Radiology: Dg Chest 1 View  Result Date: 01/19/2017 CLINICAL DATA:  Left femur fracture.  Diabetes. EXAM: CHEST 1 VIEW COMPARISON:  None. FINDINGS: Single lead pacemaker in the region of the right ventricle. Cardiomegaly. Aortic atherosclerosis. Pulmonary venous hypertension without edema. No infiltrate, collapse or effusion. No acute bone finding. IMPRESSION: Single lead pacemaker. Cardiomegaly. Aortic atherosclerosis. Pulmonary venous hypertension. Electronically Signed   By: Paulina Fusi M.D.   On: 01/19/2017 13:38   US Abdomen Complete  Result Date: 01/20/2017 CLINICAL DATA:  Possible liver disease abdominal distension EXAM: ABDOMEN ULTRASOUND COMPLETE COMPARISON:  None. FINDINGS: Gallbladder: Multiple shadowing stones, measuring up to 1.2 cm. Slightly thickened wall at 3.7 mm. Negative sonographic Murphy's. Small amount of free fluid in the right upper quadrant. Common bile duct: Diameter: 3.5 mm Liver: Probably  enlarged. Increased echogenicity. No focal hepatic abnormality. IVC: No abnormality visualized. Pancreas: Largely obscured by bowel gas. Spleen: Size and appearance within normal limits. Probable accessory splenule. Right Kidney: Length: 9.3 cm. Increased cortical echogenicity. Atrophic. No hydronephrosis. Left Kidney: Length: 8.9 cm. Increased cortical echogenicity. Atrophic. No hydronephrosis. Cyst in the midpole measuring 4.1 x 2.7 x 4 cm Abdominal aorta: Obscured by bowel gas Other findings: Small amount of ascites in the upper abdomen IMPRESSION: 1. Gallstones with slightly thickened wall which is a nonspecific finding. Negative sonographic Murphy's. No biliary dilatation. 2. Enlarged appearing echogenic liver suggesting fatty infiltration 3. Small amount of ascites in the upper abdomen 4. Echogenic atrophic kidneys with no hydronephrosis. 4.1 cm cyst in the left kidney. Electronically Signed   By: Jasmine Pang M.D.   On: 01/20/2017 01:55   Ct Hip Left Wo Contrast  Result Date: 01/19/2017 CLINICAL DATA:  Status post trip and fall today with a left hip fracture. Initial encounter. EXAM: CT OF THE LEFT HIP WITHOUT CONTRAST TECHNIQUE: Multidetector CT imaging of the left hip was performed according to the standard protocol. Multiplanar CT image reconstructions  were also generated. COMPARISON:  Plain films left hip earlier today. FINDINGS: Bones/Joint/Cartilage As seen on the comparison plain films, the patient has a nondisplaced fracture of the left hip extending from the base of the femoral neck superiorly along the intertrochanteric ridge to the lesser trochanter. The left hip is located. No other fracture is identified. Mild degenerative changes seen about the left hip Ligaments Suboptimally assessed by CT. Muscles and Tendons Intact. Soft tissues There is a large volume of pelvic ascites. Extensive iliac atherosclerosis is identified. Calcified uterine fibroids are noted. IMPRESSION: Acute nondisplaced  left hip fracture extends from the base the femoral neck superiorly along the trochanteric region of the lesser trochanter. No other acute bony abnormality. Large volume of pelvic ascites. Extensive atherosclerosis. Electronically Signed   By: Drusilla Kanner M.D.   On: 01/19/2017 15:48   Dg Hip Unilat With Pelvis 2-3 Views Left  Result Date: 01/19/2017 CLINICAL DATA:  Fall.  Left hip pain. EXAM: DG HIP (WITH OR WITHOUT PELVIS) 2-3V LEFT COMPARISON:  No recent prior . FINDINGS: Diffuse osteopenia degenerative change. Fracture of the base of the left femoral neck appears to be present. No prominent displacement. Aortoiliac atherosclerotic vascular calcification. IMPRESSION: 1. Nondisplaced fracture of the base of the left femoral neck appears to be present. 2. Severe osteopenia. Degenerative changes lumbar spine and both hips. 3. Aortoiliac atherosclerotic vascular disease. Electronically Signed   By: Maisie Fus  Register   On: 01/19/2017 12:49   Dg Knee 3 Views Left  Result Date: 01/19/2017 CLINICAL DATA:  Closed left hip fracture. Clinical concern for a knee fracture. EXAM: LEFT KNEE - 3 VIEW COMPARISON:  None. FINDINGS: Diffuse osteopenia. Marked medial and lateral joint space narrowing. Mild patellofemoral spur formation. Moderate-sized effusion. Anterior and medial soft tissue swelling. No fracture or dislocation visualized on limited views due to limited ability of the patient to cooperate for positioning due to the hip fracture. Dense atheromatous arterial calcifications. IMPRESSION: Limited examination demonstrating a moderate-sized effusion, tricompartmental degenerative changes and no visible fracture. If there is a continued clinical concern for the possibility of a knee fracture, a knee CT without contrast would be recommended since the patient was unable to cooperate fully for positioning on the radiographs. Electronically Signed   By: Beckie Salts M.D.   On: 01/19/2017 15:31    Medications:   .  [START ON 01/21/2017] calcitRIOL  1 mcg Oral Q T,Th,Sa-HD  . chlorhexidine  60 mL Topical Once  . [START ON 01/21/2017] cinacalcet  60 mg Oral Q T,Th,Sa-HD  . cloNIDine  0.3 mg Oral TID  . docusate sodium  100 mg Oral BID  . folic acid  1 mg Oral BID  . furosemide  40 mg Oral BID  . insulin aspart  0-5 Units Subcutaneous QHS  . insulin aspart  0-9 Units Subcutaneous TID WC  . mometasone-formoterol  2 puff Inhalation BID  . povidone-iodine  2 application Topical Once  . povidone-iodine  2 application Topical Once  . sevelamer carbonate  800 mg Oral TID WC  . traZODone  50 mg Oral QHS   Continuous Infusions: . sodium chloride    . sodium chloride    . clindamycin (CLEOCIN) IV    . ferric gluconate (FERRLECIT/NULECIT) IV      Medical decision making is of high complexity and this patient is at high risk of deterioration, therefore this is a level 3 visit.  (> 4 problem points, >4 data points, high risk: Need 2 out of 3)  LOS: 1 day   RAMA,CHRISTINA  [facility] Pager [phone]. If unable to reach me by pager, please call my cell phone at [phone].  *Please refer to [email or web address], password [email or web address] to get updated schedule on who will round on this patient, as hospitalists switch teams weekly. If 7PM-7AM, please contact night-coverage at [email or web address], password [email or web address] for any overnight needs.  01/20/2017, 7:40 AM

## 2017-01-21 ENCOUNTER — Encounter (HOSPITAL_COMMUNITY): Payer: Self-pay | Admitting: Internal Medicine

## 2017-01-21 ENCOUNTER — Inpatient Hospital Stay (HOSPITAL_COMMUNITY): Payer: Medicare Other

## 2017-01-21 DIAGNOSIS — I360 Nonrheumatic tricuspid (valve) stenosis: Secondary | ICD-10-CM

## 2017-01-21 DIAGNOSIS — E1122 Type 2 diabetes mellitus with diabetic chronic kidney disease: Secondary | ICD-10-CM

## 2017-01-21 DIAGNOSIS — D696 Thrombocytopenia, unspecified: Secondary | ICD-10-CM

## 2017-01-21 DIAGNOSIS — K760 Fatty (change of) liver, not elsewhere classified: Secondary | ICD-10-CM

## 2017-01-21 DIAGNOSIS — Z992 Dependence on renal dialysis: Secondary | ICD-10-CM

## 2017-01-21 DIAGNOSIS — E871 Hypo-osmolality and hyponatremia: Secondary | ICD-10-CM | POA: Diagnosis present

## 2017-01-21 DIAGNOSIS — I10 Essential (primary) hypertension: Secondary | ICD-10-CM

## 2017-01-21 HISTORY — DX: Thrombocytopenia, unspecified: D69.6

## 2017-01-21 HISTORY — DX: Fatty (change of) liver, not elsewhere classified: K76.0

## 2017-01-21 LAB — ECHOCARDIOGRAM COMPLETE
HEIGHTINCHES: 67 in
WEIGHTICAEL: 3456.81 [oz_av]

## 2017-01-21 LAB — BASIC METABOLIC PANEL
ANION GAP: 16 — AB (ref 5–15)
BUN: 50 mg/dL — ABNORMAL HIGH (ref 6–20)
CALCIUM: 8.2 mg/dL — AB (ref 8.9–10.3)
CHLORIDE: 94 mmol/L — AB (ref 101–111)
CO2: 20 mmol/L — AB (ref 22–32)
Creatinine, Ser: 5.35 mg/dL — ABNORMAL HIGH (ref 0.44–1.00)
GFR calc non Af Amer: 7 mL/min — ABNORMAL LOW (ref >60)
GFR, EST AFRICAN AMERICAN: 9 mL/min — AB (ref >60)
Glucose, Bld: 156 mg/dL — ABNORMAL HIGH (ref 65–99)
POTASSIUM: 4 mmol/L (ref 3.5–5.1)
Sodium: 130 mmol/L — ABNORMAL LOW (ref 135–145)

## 2017-01-21 LAB — CBC
HEMATOCRIT: 27 % — AB (ref 36.0–46.0)
HEMOGLOBIN: 8.3 g/dL — AB (ref 12.0–15.0)
MCH: 27.7 pg (ref 26.0–34.0)
MCHC: 30.7 g/dL (ref 30.0–36.0)
MCV: 90 fL (ref 78.0–100.0)
Platelets: 75 10*3/uL — ABNORMAL LOW (ref 150–400)
RBC: 3 MIL/uL — AB (ref 3.87–5.11)
RDW: 16.9 % — ABNORMAL HIGH (ref 11.5–15.5)
WBC: 2.8 10*3/uL — AB (ref 4.0–10.5)

## 2017-01-21 LAB — GLUCOSE, CAPILLARY
Glucose-Capillary: 172 mg/dL — ABNORMAL HIGH (ref 65–99)
Glucose-Capillary: 151 mg/dL — ABNORMAL HIGH (ref 65–99)
Glucose-Capillary: 138 mg/dL — ABNORMAL HIGH (ref 65–99)

## 2017-01-21 MED ORDER — DARBEPOETIN ALFA 200 MCG/0.4ML IJ SOSY
PREFILLED_SYRINGE | INTRAMUSCULAR | Status: AC
  Administered 2017-01-21: 200 ug via INTRAVENOUS
  Filled 2017-01-21: qty 0.4

## 2017-01-21 MED ORDER — CALCITRIOL 0.5 MCG PO CAPS
ORAL_CAPSULE | ORAL | Status: AC
  Filled 2017-01-21: qty 2

## 2017-01-21 MED ORDER — DARBEPOETIN ALFA 200 MCG/0.4ML IJ SOSY
200.0000 ug | PREFILLED_SYRINGE | INTRAMUSCULAR | Status: DC
  Administered 2017-01-21: 200 ug via INTRAVENOUS
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
## 2017-01-21 NOTE — Progress Notes (Signed)
  Echocardiogram 2D Echocardiogram has been performed.  Arvil ChacoFoster, Cynara Tatham 01/21/2017, 10:00 AM

## 2017-01-21 NOTE — Progress Notes (Signed)
Subjective: Patient stable.  Pain controlled.   Objective: Vital signs in last 24 hours: Temp:  [97.7 F (36.5 C)-98.5 F (36.9 C)] 97.7 F (36.5 C) (05/05 0447) Pulse Rate:  [59-89] 59 (05/05 0447) Resp:  [10-18] 16 (05/05 0447) BP: (78-116)/(54-67) 116/64 (05/05 0447) SpO2:  [93 %-100 %] 97 % (05/05 0850) Weight:  [216 lb 0.8 oz (98 kg)] 216 lb 0.8 oz (98 kg) (05/04 1135)  Intake/Output from previous day: 05/04 0701 - 05/05 0700 In: 1640 [P.O.:240; I.V.:1300; IV Piggyback:100] Out: 200 [Urine:150; Blood:50] Intake/Output this shift: No intake/output data recorded.  Exam:  Dorsiflexion/Plantar flexion intact  Labs:  Recent Labs  01/19/17 1147 01/20/17 0608 01/20/17 2104 01/21/17 0805  HGB 10.2* 10.2* 9.6* 8.3*    Recent Labs  01/20/17 2104 01/21/17 0805  WBC 4.6 2.8*  RBC 3.42* 3.00*  HCT 31.0* 27.0*  PLT 87* 75*    Recent Labs  01/20/17 0608 01/20/17 2104 01/21/17 0805  NA 129*  --  130*  K 4.4  --  4.0  CL 93*  --  94*  CO2 20*  --  20*  BUN 71*  --  50*  CREATININE 7.25* 5.19* 5.35*  GLUCOSE 101*  --  156*  CALCIUM 8.8*  --  8.2*    Recent Labs  01/19/17 1147  INR 1.35    Assessment/Plan: Plan today is to mobilize with physical therapy.  Anticipate discharge to skilled nursing next week   G Dorene GrebeScott Jolee Critcher 01/21/2017, 9:27 AM

## 2017-01-21 NOTE — Progress Notes (Signed)
OT Cancellation Note  Patient Details Name: Nance PearCarol Rutan MRN: [id number] DOB: 08/31/1946   Cancelled Treatment:    Reason Eval/Treat Not Completed: Patient at procedure or test/ unavailable. Pt being transported to HD at this time. OT will check back as able for evaluation.  Doristine Sectionharity A Mavery Milling, MS OTR/L  Pager: [phone]   Doristine SectionCharity A Kyrstan Gotwalt 01/21/2017, 2:51 PM

## 2017-01-21 NOTE — Anesthesia Postprocedure Evaluation (Signed)
Anesthesia Post Note  Patient: Lindsey Sutton  Procedure(s) Performed: Procedure(s) (LRB): INTRAMEDULLARY (IM) NAIL INTERTROCHANTRIC (Left)  Patient location during evaluation: PACU Anesthesia Type: General Level of consciousness: awake and alert and patient cooperative Pain management: pain level controlled Vital Signs Assessment: post-procedure vital signs reviewed and stable Respiratory status: spontaneous breathing and respiratory function stable Cardiovascular status: stable Anesthetic complications: no       Last Vitals:  Vitals:   01/21/17 0019 01/21/17 0447  BP: 109/64 116/64  Pulse: (!) 59 (!) 59  Resp: 16 16  Temp: 36.7 C 36.5 C    Last Pain:  Vitals:   01/21/17 0447  TempSrc: Oral  PainSc:                  Sienna Stonehocker S

## 2017-01-21 NOTE — Progress Notes (Signed)
Progress Note    Genesia Caslin  ZOX:[id number] DOB: [birth year]  DOA: 01/19/2017 PCP: [name], NP    Brief Narrative:   Chief complaint: Follow-up left hip fracture  Medical records reviewed and are as summarized below:  Lindsey Sutton is an 71 y.o. female with a PMH of ESRD on HD, atrial fibrillation status post pacemaker, chronic iron deficiency anemia, diabetes, and anxiety who was admitted 01/19/17 with chief complaint of left hip pain after suffering from a mechanical fall resulting in a hip fracture.  Assessment/Plan:   Principal Problem:   Hip fx (HCC)/Nondisplaced intertrochanteric fracture of the left femur Underwent intramedullary implant 01/20/17 by Dr. Roda Shutters after being medically optimized. Continue Norco for pain control. PT/OT.  Active Problems:   Thrombocytopenia May be related to her fatty liver disease. Monitor.    Hypertension Blood pressure stable. Continue clonidine and Lasix.    Hyponatremia Mild, monitoring.    Atrial fibrillation status post Pacemaker CHADS2Vasc = 4. Not on chronic anticoagulation. 2-D echo pending.    Iron deficiency anemia Hemoglobin 9.6 mg/dL today, slightly lower than previous. Continue  Nulecit and folic acid. Continue to monitor. No current indication for transfusion.    Diabetes mellitus with renal complications Hemoglobin A1c 5.4%. Glipizide on hold.Currently being managed with insulin sensitive SSI Q AC/HS. CBGs 86-172.    History of Systolic heart failure (HCC) Cardiomegaly on chest x-ray. Appears compensated. 2-D echo pending.    ESRD (end stage renal disease) (HCC) Hemodialysis was done 01/20/17. Nephrology following. Discontinue IV fluids as the patient is a ESRD patient. Will have hemodialysis again today to maintain her usual schedule. Continue Sensipar, calcitriol and Renvela.    Ascites/fatty liver disease History of paracentesis in the past. Right upper quadrant ultrasound showed a fatty liver.Spoke with  patient about weight loss.   Obesity (BMI 30-39.9) Body mass index is 33.84 kg/m. Encouraged weight loss.  COPD Continue Dulera.    Family Communication/Anticipated D/C date and plan/Code Status   DVT prophylaxis: Lovenox ordered. Code Status: Full Code.  Family Communication: Multiple family updated at the bedside 01/20/17, none present today. Disposition Plan: Likely will need SNF.   Medical Consultants:    Orthopedic Surgery  Nephrology   Procedures:    2 D Echo 01/21/17  Anti-Infectives:    None  Subjective:   Having level 6/10 left hip pain.  Says she has not had any pain medicine since last night.  No nausea or vomiting, chest pain or shortness of breath. Last BM was yesterday.   Objective:    Vitals:   01/20/17 1749 01/20/17 2100 01/21/17 0019 01/21/17 0447  BP:  (!) 111/58 109/64 116/64  Pulse:  70 (!) 59 (!) 59  Resp:  16 16 16   Temp: 98 F (36.7 C) 98.5 F (36.9 C) 98.1 F (36.7 C) 97.7 F (36.5 C)  TempSrc:  Oral Oral Oral  SpO2:  95% 96% 97%  Weight:      Height:        Intake/Output Summary (Last 24 hours) at 01/21/17 0749 Last data filed at 01/21/17 0326  Gross per 24 hour  Intake             1640 ml  Output              200 ml  Net             1440 ml   Filed Weights   01/19/17 1119 01/20/17 0720 01/20/17 1135  Weight: 96.6  kg (213 lb) 98 kg (216 lb 0.8 oz) 98 kg (216 lb 0.8 oz)    Exam: General exam: Appears less drowsy. Respiratory system: Clear to auscultation, decreased breath sounds. Respiratory effort normal. Cardiovascular system: HSIR. No JVD,  rubs, gallops or clicks. II/VI murmur. Gastrointestinal system: Abdomen is softly ndistended, nontender. No organomegaly or masses felt. Normal bowel sounds heard. Central nervous system: Drowsy. No focal neurological deficits. Extremities: No clubbing, or cyanosis. Left wrist AVG with +bruit. Left lateral thigh dressing CDI. Skin: No rashes, lesions or ulcers. Psychiatry:  Judgement and insight appear normal. Mood & affect flat.   Data Reviewed:   I have personally reviewed following labs and imaging studies:  Labs: Basic Metabolic Panel:  Recent Labs Lab 01/19/17 1147 01/20/17 0608 01/20/17 0613 01/20/17 2104  NA 131* 129*  --   --   K 4.1 4.4  --   --   CL 94* 93*  --   --   CO2 18* 20*  --   --   GLUCOSE 115* 101*  --   --   BUN 65* 71*  --   --   CREATININE 6.64* 7.25*  --  5.19*  CALCIUM 8.9 8.8*  --   --   PHOS  --   --  9.7*  --    GFR Estimated Creatinine Clearance: 12.1 mL/min (A) (by C-G formula based on SCr of 5.19 mg/dL (H)). Liver Function Tests:  Recent Labs Lab 01/19/17 1630 01/20/17 0613  AST 15  --   ALT 8*  --   ALKPHOS 65  --   BILITOT 1.3*  --   PROT 7.0  --   ALBUMIN 3.7 3.4*   No results for input(s): LIPASE, AMYLASE in the last 168 hours. No results for input(s): AMMONIA in the last 168 hours. Coagulation profile  Recent Labs Lab 01/19/17 1147  INR 1.35    CBC:  Recent Labs Lab 01/19/17 1147 01/20/17 0608 01/20/17 2104  WBC 3.6* 4.0 4.6  NEUTROABS 2.7  --   --   HGB 10.2* 10.2* 9.6*  HCT 32.4* 32.5* 31.0*  MCV 89.3 89.5 90.6  PLT 102* 85* 87*   Cardiac Enzymes: No results for input(s): CKTOTAL, CKMB, CKMBINDEX, TROPONINI in the last 168 hours. BNP (last 3 results) No results for input(s): PROBNP in the last 8760 hours. CBG:  Recent Labs Lab 01/20/17 1232 01/20/17 1457 01/20/17 1649 01/20/17 2057 01/21/17 0624  GLUCAP 86 89 109* 146* 172*   D-Dimer: No results for input(s): DDIMER in the last 72 hours. Hgb A1c:  Recent Labs  01/19/17 1147  HGBA1C 5.4    Microbiology Recent Results (from the past 240 hour(s))  Surgical pcr screen     Status: None   Collection Time: 01/19/17  5:10 PM  Result Value Ref Range Status   MRSA, PCR NEGATIVE NEGATIVE Final   Staphylococcus aureus NEGATIVE NEGATIVE Final    Comment:        The Xpert SA Assay (FDA approved for NASAL  specimens in patients over 66 years of age), is one component of a comprehensive surveillance program.  Test performance has been validated by [facility] for patients greater than or equal to 88 year old. It is not intended to diagnose infection nor to guide or monitor treatment.     Radiology: Dg Chest 1 View  Result Date: 01/19/2017 CLINICAL DATA:  Left femur fracture.  Diabetes. EXAM: CHEST 1 VIEW COMPARISON:  None. FINDINGS: Single lead pacemaker in the region  of the right ventricle. Cardiomegaly. Aortic atherosclerosis. Pulmonary venous hypertension without edema. No infiltrate, collapse or effusion. No acute bone finding. IMPRESSION: Single lead pacemaker. Cardiomegaly. Aortic atherosclerosis. Pulmonary venous hypertension. Electronically Signed   By: Paulina Fusi M.D.   On: 01/19/2017 13:38   US Abdomen Complete  Result Date: 01/20/2017 CLINICAL DATA:  Possible liver disease abdominal distension EXAM: ABDOMEN ULTRASOUND COMPLETE COMPARISON:  None. FINDINGS: Gallbladder: Multiple shadowing stones, measuring up to 1.2 cm. Slightly thickened wall at 3.7 mm. Negative sonographic Murphy's. Small amount of free fluid in the right upper quadrant. Common bile duct: Diameter: 3.5 mm Liver: Probably enlarged. Increased echogenicity. No focal hepatic abnormality. IVC: No abnormality visualized. Pancreas: Largely obscured by bowel gas. Spleen: Size and appearance within normal limits. Probable accessory splenule. Right Kidney: Length: 9.3 cm. Increased cortical echogenicity. Atrophic. No hydronephrosis. Left Kidney: Length: 8.9 cm. Increased cortical echogenicity. Atrophic. No hydronephrosis. Cyst in the midpole measuring 4.1 x 2.7 x 4 cm Abdominal aorta: Obscured by bowel gas Other findings: Small amount of ascites in the upper abdomen IMPRESSION: 1. Gallstones with slightly thickened wall which is a nonspecific finding. Negative sonographic Murphy's. No biliary dilatation. 2. Enlarged appearing  echogenic liver suggesting fatty infiltration 3. Small amount of ascites in the upper abdomen 4. Echogenic atrophic kidneys with no hydronephrosis. 4.1 cm cyst in the left kidney. Electronically Signed   By: Jasmine Pang M.D.   On: 01/20/2017 01:55   Ct Hip Left Wo Contrast  Result Date: 01/19/2017 CLINICAL DATA:  Status post trip and fall today with a left hip fracture. Initial encounter. EXAM: CT OF THE LEFT HIP WITHOUT CONTRAST TECHNIQUE: Multidetector CT imaging of the left hip was performed according to the standard protocol. Multiplanar CT image reconstructions were also generated. COMPARISON:  Plain films left hip earlier today. FINDINGS: Bones/Joint/Cartilage As seen on the comparison plain films, the patient has a nondisplaced fracture of the left hip extending from the base of the femoral neck superiorly along the intertrochanteric ridge to the lesser trochanter. The left hip is located. No other fracture is identified. Mild degenerative changes seen about the left hip Ligaments Suboptimally assessed by CT. Muscles and Tendons Intact. Soft tissues There is a large volume of pelvic ascites. Extensive iliac atherosclerosis is identified. Calcified uterine fibroids are noted. IMPRESSION: Acute nondisplaced left hip fracture extends from the base the femoral neck superiorly along the trochanteric region of the lesser trochanter. No other acute bony abnormality. Large volume of pelvic ascites. Extensive atherosclerosis. Electronically Signed   By: Drusilla Kanner M.D.   On: 01/19/2017 15:48   Dg C-arm 1-60 Min  Result Date: 01/20/2017 CLINICAL DATA:  Left intramedullary nail. EXAM: OPERATIVE LEFT HIP (WITH PELVIS IF PERFORMED) 2 VIEWS TECHNIQUE: Fluoroscopic spot image(s) were submitted for interpretation post-operatively. COMPARISON:  Radiography from yesterday FINDINGS: Fluoroscopy showing proximal femur fracture reduction and internal fixation. No unexpected finding. IMPRESSION: Fluoroscopy for left  femur fracture ORIF.  No unexpected finding. Electronically Signed   By: Marnee Spring M.D.   On: 01/20/2017 16:40   Dg Hip Operative Unilat W Or W/o Pelvis Left  Result Date: 01/20/2017 CLINICAL DATA:  Left intramedullary nail. EXAM: OPERATIVE LEFT HIP (WITH PELVIS IF PERFORMED) 2 VIEWS TECHNIQUE: Fluoroscopic spot image(s) were submitted for interpretation post-operatively. COMPARISON:  Radiography from yesterday FINDINGS: Fluoroscopy showing proximal femur fracture reduction and internal fixation. No unexpected finding. IMPRESSION: Fluoroscopy for left femur fracture ORIF.  No unexpected finding. Electronically Signed   By: Kathrynn Ducking.D.  On: 01/20/2017 16:40   Dg Hip Unilat With Pelvis 2-3 Views Left  Result Date: 01/19/2017 CLINICAL DATA:  Fall.  Left hip pain. EXAM: DG HIP (WITH OR WITHOUT PELVIS) 2-3V LEFT COMPARISON:  No recent prior . FINDINGS: Diffuse osteopenia degenerative change. Fracture of the base of the left femoral neck appears to be present. No prominent displacement. Aortoiliac atherosclerotic vascular calcification. IMPRESSION: 1. Nondisplaced fracture of the base of the left femoral neck appears to be present. 2. Severe osteopenia. Degenerative changes lumbar spine and both hips. 3. Aortoiliac atherosclerotic vascular disease. Electronically Signed   By: Maisie Fushomas  Register   On: 01/19/2017 12:49   Dg Knee 3 Views Left  Result Date: 01/19/2017 CLINICAL DATA:  Closed left hip fracture. Clinical concern for a knee fracture. EXAM: LEFT KNEE - 3 VIEW COMPARISON:  None. FINDINGS: Diffuse osteopenia. Marked medial and lateral joint space narrowing. Mild patellofemoral spur formation. Moderate-sized effusion. Anterior and medial soft tissue swelling. No fracture or dislocation visualized on limited views due to limited ability of the patient to cooperate for positioning due to the hip fracture. Dense atheromatous arterial calcifications. IMPRESSION: Limited examination demonstrating a  moderate-sized effusion, tricompartmental degenerative changes and no visible fracture. If there is a continued clinical concern for the possibility of a knee fracture, a knee CT without contrast would be recommended since the patient was unable to cooperate fully for positioning on the radiographs. Electronically Signed   By: Beckie SaltsSteven  Reid M.D.   On: 01/19/2017 15:31    Medications:   . calcitRIOL  1 mcg Oral Q T,Th,Sa-HD  . cinacalcet  60 mg Oral Q T,Th,Sa-HD  . cloNIDine  0.3 mg Oral TID  . docusate sodium  100 mg Oral BID  . enoxaparin (LOVENOX) injection  30 mg Subcutaneous Q24H  . feeding supplement (NEPRO CARB STEADY)  237 mL Oral Q1500  . folic acid  1 mg Oral BID  . furosemide  40 mg Oral BID  . insulin aspart  0-5 Units Subcutaneous QHS  . insulin aspart  0-9 Units Subcutaneous TID WC  . mometasone-formoterol  2 puff Inhalation BID  . sevelamer carbonate  800 mg Oral TID WC  . traZODone  50 mg Oral QHS   Continuous Infusions: . sodium chloride Stopped (01/20/17 1630)  . sodium chloride 125 mL/hr at 01/21/17 0522  . ferric gluconate (FERRLECIT/NULECIT) IV 125 mg (01/20/17 1035)  . methocarbamol (ROBAXIN)  IV      Medical decision making is of high complexity and this patient is at high risk of deterioration, therefore this is a level 3 visit.  (> 4 problem points, 2 data points, high risk: Need 2 out of 3)   LOS: 2 days   RAMA,CHRISTINA  [facility] Pager [phone]. If unable to reach me by pager, please call my cell phone at [phone].  *Please refer to [email or web address], password [email or web address] to get updated schedule on who will round on this patient, as hospitalists switch teams weekly. If 7PM-7AM, please contact night-coverage at [email or web address], password [email or web address] for any overnight needs.  01/21/2017, 7:49 AM    .Wetzel Bjornstadcprbi

## 2017-01-21 NOTE — Progress Notes (Signed)
[facility] Progress Note   Subjective:"I don't want to go to dialysis-I have family coming". Actually family already here and want pt to go to her tx.   Objective Vitals:   01/20/17 2100 01/21/17 0019 01/21/17 0447 01/21/17 0850  BP: (!) 111/58 109/64 116/64   Pulse: 70 (!) 59 (!) 59   Resp: 16 16 16    Temp: 98.5 F (36.9 C) 98.1 F (36.7 C) 97.7 F (36.5 C)   TempSrc: Oral Oral Oral   SpO2: 95% 96% 97% 97%  Weight:      Height:       Physical Exam General: Pleasant, NAD.  Heart: S1, S2. 2/6 systolic M Lungs: CTAB A/P sl decreased in bases Abdomen:actvie BS non-tender Extremities:No LE edema. L hip with drsg CDI some shadowing upper drsg, bruised L hip. Dialysis Access: LFA AVF + bruit   Additional Objective Labs: Basic Metabolic Panel:  Recent Labs Lab 01/19/17 1147 01/20/17 0608 01/20/17 0613 01/20/17 2104 01/21/17 0805  NA 131* 129*  --   --  130*  K 4.1 4.4  --   --  4.0  CL 94* 93*  --   --  94*  CO2 18* 20*  --   --  20*  GLUCOSE 115* 101*  --   --  156*  BUN 65* 71*  --   --  50*  CREATININE 6.64* 7.25*  --  5.19* 5.35*  CALCIUM 8.9 8.8*  --   --  8.2*  PHOS  --   --  9.7*  --   --    Liver Function Tests:  Recent Labs Lab 01/19/17 1630 01/20/17 0613  AST 15  --   ALT 8*  --   ALKPHOS 65  --   BILITOT 1.3*  --   PROT 7.0  --   ALBUMIN 3.7 3.4*   No results for input(s): LIPASE, AMYLASE in the last 168 hours. CBC:  Recent Labs Lab 01/19/17 1147 01/20/17 0608 01/20/17 2104 01/21/17 0805  WBC 3.6* 4.0 4.6 2.8*  NEUTROABS 2.7  --   --   --   HGB 10.2* 10.2* 9.6* 8.3*  HCT 32.4* 32.5* 31.0* 27.0*  MCV 89.3 89.5 90.6 90.0  PLT 102* 85* 87* 75*   Blood Culture No results found for: SDES, SPECREQUEST, CULT, REPTSTATUS  Cardiac Enzymes: No results for input(s): CKTOTAL, CKMB, CKMBINDEX, TROPONINI in the last 168 hours. CBG:  Recent Labs Lab 01/20/17 1457 01/20/17 1649 01/20/17 2057 01/21/17 0624 01/21/17 1159   GLUCAP 89 109* 146* 172* 151*   Iron Studies: No results for input(s): IRON, TIBC, TRANSFERRIN, FERRITIN in the last 72 hours. @lablastinr3 @ Studies/Results: Dg Chest 1 View  Result Date: 01/19/2017 CLINICAL DATA:  Left femur fracture.  Diabetes. EXAM: CHEST 1 VIEW COMPARISON:  None. FINDINGS: Single lead pacemaker in the region of the right ventricle. Cardiomegaly. Aortic atherosclerosis. Pulmonary venous hypertension without edema. No infiltrate, collapse or effusion. No acute bone finding. IMPRESSION: Single lead pacemaker. Cardiomegaly. Aortic atherosclerosis. Pulmonary venous hypertension. Electronically Signed   By: Paulina Fusi M.D.   On: 01/19/2017 13:38   US Abdomen Complete  Result Date: 01/20/2017 CLINICAL DATA:  Possible liver disease abdominal distension EXAM: ABDOMEN ULTRASOUND COMPLETE COMPARISON:  None. FINDINGS: Gallbladder: Multiple shadowing stones, measuring up to 1.2 cm. Slightly thickened wall at 3.7 mm. Negative sonographic Murphy's. Small amount of free fluid in the right upper quadrant. Common bile duct: Diameter: 3.5 mm Liver: Probably enlarged. Increased echogenicity. No focal hepatic abnormality. IVC:  No abnormality visualized. Pancreas: Largely obscured by bowel gas. Spleen: Size and appearance within normal limits. Probable accessory splenule. Right Kidney: Length: 9.3 cm. Increased cortical echogenicity. Atrophic. No hydronephrosis. Left Kidney: Length: 8.9 cm. Increased cortical echogenicity. Atrophic. No hydronephrosis. Cyst in the midpole measuring 4.1 x 2.7 x 4 cm Abdominal aorta: Obscured by bowel gas Other findings: Small amount of ascites in the upper abdomen IMPRESSION: 1. Gallstones with slightly thickened wall which is a nonspecific finding. Negative sonographic Murphy's. No biliary dilatation. 2. Enlarged appearing echogenic liver suggesting fatty infiltration 3. Small amount of ascites in the upper abdomen 4. Echogenic atrophic kidneys with no hydronephrosis. 4.1  cm cyst in the left kidney. Electronically Signed   By: Jasmine PangKim  Fujinaga M.D.   On: 01/20/2017 01:55   Ct Hip Left Wo Contrast  Result Date: 01/19/2017 CLINICAL DATA:  Status post trip and fall today with a left hip fracture. Initial encounter. EXAM: CT OF THE LEFT HIP WITHOUT CONTRAST TECHNIQUE: Multidetector CT imaging of the left hip was performed according to the standard protocol. Multiplanar CT image reconstructions were also generated. COMPARISON:  Plain films left hip earlier today. FINDINGS: Bones/Joint/Cartilage As seen on the comparison plain films, the patient has a nondisplaced fracture of the left hip extending from the base of the femoral neck superiorly along the intertrochanteric ridge to the lesser trochanter. The left hip is located. No other fracture is identified. Mild degenerative changes seen about the left hip Ligaments Suboptimally assessed by CT. Muscles and Tendons Intact. Soft tissues There is a large volume of pelvic ascites. Extensive iliac atherosclerosis is identified. Calcified uterine fibroids are noted. IMPRESSION: Acute nondisplaced left hip fracture extends from the base the femoral neck superiorly along the trochanteric region of the lesser trochanter. No other acute bony abnormality. Large volume of pelvic ascites. Extensive atherosclerosis. Electronically Signed   By: Drusilla Kannerhomas  Dalessio M.D.   On: 01/19/2017 15:48   Dg C-arm 1-60 Min  Result Date: 01/20/2017 CLINICAL DATA:  Left intramedullary nail. EXAM: OPERATIVE LEFT HIP (WITH PELVIS IF PERFORMED) 2 VIEWS TECHNIQUE: Fluoroscopic spot image(s) were submitted for interpretation post-operatively. COMPARISON:  Radiography from yesterday FINDINGS: Fluoroscopy showing proximal femur fracture reduction and internal fixation. No unexpected finding. IMPRESSION: Fluoroscopy for left femur fracture ORIF.  No unexpected finding. Electronically Signed   By: Marnee SpringJonathon  Watts M.D.   On: 01/20/2017 16:40   Dg Hip Operative Unilat W Or  W/o Pelvis Left  Result Date: 01/20/2017 CLINICAL DATA:  Left intramedullary nail. EXAM: OPERATIVE LEFT HIP (WITH PELVIS IF PERFORMED) 2 VIEWS TECHNIQUE: Fluoroscopic spot image(s) were submitted for interpretation post-operatively. COMPARISON:  Radiography from yesterday FINDINGS: Fluoroscopy showing proximal femur fracture reduction and internal fixation. No unexpected finding. IMPRESSION: Fluoroscopy for left femur fracture ORIF.  No unexpected finding. Electronically Signed   By: Marnee SpringJonathon  Watts M.D.   On: 01/20/2017 16:40   Dg Hip Unilat With Pelvis 2-3 Views Left  Result Date: 01/19/2017 CLINICAL DATA:  Fall.  Left hip pain. EXAM: DG HIP (WITH OR WITHOUT PELVIS) 2-3V LEFT COMPARISON:  No recent prior . FINDINGS: Diffuse osteopenia degenerative change. Fracture of the base of the left femoral neck appears to be present. No prominent displacement. Aortoiliac atherosclerotic vascular calcification. IMPRESSION: 1. Nondisplaced fracture of the base of the left femoral neck appears to be present. 2. Severe osteopenia. Degenerative changes lumbar spine and both hips. 3. Aortoiliac atherosclerotic vascular disease. Electronically Signed   By: Maisie Fushomas  Register   On: 01/19/2017 12:49   Dg Knee  3 Views Left  Result Date: 01/19/2017 CLINICAL DATA:  Closed left hip fracture. Clinical concern for a knee fracture. EXAM: LEFT KNEE - 3 VIEW COMPARISON:  None. FINDINGS: Diffuse osteopenia. Marked medial and lateral joint space narrowing. Mild patellofemoral spur formation. Moderate-sized effusion. Anterior and medial soft tissue swelling. No fracture or dislocation visualized on limited views due to limited ability of the patient to cooperate for positioning due to the hip fracture. Dense atheromatous arterial calcifications. IMPRESSION: Limited examination demonstrating a moderate-sized effusion, tricompartmental degenerative changes and no visible fracture. If there is a continued clinical concern for the possibility  of a knee fracture, a knee CT without contrast would be recommended since the patient was unable to cooperate fully for positioning on the radiographs. Electronically Signed   By: Beckie Salts M.D.   On: 01/19/2017 15:31   Medications: . sodium chloride 10 mL/hr at 01/21/17 0833  . ferric gluconate (FERRLECIT/NULECIT) IV 125 mg (01/20/17 1035)  . methocarbamol (ROBAXIN)  IV     . calcitRIOL  1 mcg Oral Q T,Th,Sa-HD  . cinacalcet  60 mg Oral Q T,Th,Sa-HD  . cloNIDine  0.3 mg Oral TID  . docusate sodium  100 mg Oral BID  . enoxaparin (LOVENOX) injection  30 mg Subcutaneous Q24H  . feeding supplement (NEPRO CARB STEADY)  237 mL Oral Q1500  . folic acid  1 mg Oral BID  . furosemide  40 mg Oral BID  . insulin aspart  0-5 Units Subcutaneous QHS  . insulin aspart  0-9 Units Subcutaneous TID WC  . mometasone-formoterol  2 puff Inhalation BID  . sevelamer carbonate  800 mg Oral TID WC  . traZODone  50 mg Oral QHS   Dialysis Orders: [place] T,Th,S 4 hrs 180 NRe 94 kg 2.0 K/2.0 Ca 450/800 -Heparin 5000 units TIW -Senipar 30 mg PO TIW (Last PTH 1453 01/14/17) -Calcitriol 1.0 mcg PO TIW -Mircera 225 mcg IV q 2 weeks (last dose 01/10/17 HGB 10.5 01/14/17) -Venofer 50 mg IV weekly (last dose 01/14/17 Ferritin 977 Fe 42 TSat 19% 01/14/17)  Assessment/Plan: 1. L. Femoral neck Fx-S/P  intertrochanteric fracture with intramedullary implant per Dr. Roda Shutters. PO day1 2. ESRD -Had HD yesterday off schedule, HD today on schedule today. K+ 4.0  3. Anemia - HGB down 8.3 today. Give ESA/FE with HD today.  4. Secondary hyperparathyroidism - Cont sensipar, binders, VDRA. Phos 9.7. Non-adherent to BMD meds.  5. HTN/volume - HD yesterday Pre wt 98 kg-had issues removing volume-Net UF 610 kg. Is 4 kg above EDW. HD today attempt 3 liters. BP controlled.  6. Nutrition - Renal diet, nepro renal vits.  Rita H. Brown NP-C 01/21/2017, 12:19 PM  [facility] [phone]  Pt seen, examined and agree w  A/P as above.  Vinson Moselle MD [employer] pager [phone]   01/21/2017, 1:49 PM

## 2017-01-22 ENCOUNTER — Encounter (HOSPITAL_COMMUNITY): Payer: Self-pay | Admitting: Internal Medicine

## 2017-01-22 DIAGNOSIS — I350 Nonrheumatic aortic (valve) stenosis: Secondary | ICD-10-CM

## 2017-01-22 DIAGNOSIS — N2581 Secondary hyperparathyroidism of renal origin: Secondary | ICD-10-CM

## 2017-01-22 HISTORY — DX: Secondary hyperparathyroidism of renal origin: N25.81

## 2017-01-22 LAB — RENAL FUNCTION PANEL
Albumin: 3.3 g/dL — ABNORMAL LOW (ref 3.5–5.0)
Anion gap: 16 — ABNORMAL HIGH (ref 5–15)
BUN: 38 mg/dL — AB (ref 6–20)
CALCIUM: 8.5 mg/dL — AB (ref 8.9–10.3)
CHLORIDE: 91 mmol/L — AB (ref 101–111)
CO2: 24 mmol/L (ref 22–32)
CREATININE: 4.73 mg/dL — AB (ref 0.44–1.00)
GFR calc Af Amer: 10 mL/min — ABNORMAL LOW (ref >60)
GFR calc non Af Amer: 9 mL/min — ABNORMAL LOW (ref >60)
GLUCOSE: 162 mg/dL — AB (ref 65–99)
Phosphorus: 7.2 mg/dL — ABNORMAL HIGH (ref 2.5–4.6)
Potassium: 3.8 mmol/L (ref 3.5–5.1)
SODIUM: 131 mmol/L — AB (ref 135–145)

## 2017-01-22 LAB — CBC
HCT: 26.6 % — ABNORMAL LOW (ref 36.0–46.0)
Hemoglobin: 8.2 g/dL — ABNORMAL LOW (ref 12.0–15.0)
MCH: 28.3 pg (ref 26.0–34.0)
MCHC: 30.8 g/dL (ref 30.0–36.0)
MCV: 91.7 fL (ref 78.0–100.0)
PLATELETS: 88 10*3/uL — AB (ref 150–400)
RBC: 2.9 MIL/uL — AB (ref 3.87–5.11)
RDW: 17.1 % — AB (ref 11.5–15.5)
WBC: 3.7 10*3/uL — ABNORMAL LOW (ref 4.0–10.5)

## 2017-01-22 LAB — GLUCOSE, CAPILLARY
GLUCOSE-CAPILLARY: 128 mg/dL — AB (ref 65–99)
Glucose-Capillary: 165 mg/dL — ABNORMAL HIGH (ref 65–99)
Glucose-Capillary: 91 mg/dL (ref 65–99)
Glucose-Capillary: 103 mg/dL — ABNORMAL HIGH (ref 65–99)

## 2017-01-22 NOTE — Evaluation (Signed)
Occupational Therapy Evaluation Patient Details Name: Lindsey Sutton MRN: [id number] DOB: 1946-04-27 Today's Date: 01/22/2017    History of Present Illness Patient is a 71 yo female admitted 01/19/17 following fall with Lt hip fx.  Patient s/p IM nailing of fracture Lt hip.    PMH:  ESRD on HD, HTN, Afib, pacemaker, anemia, DM, anxiety, CHF   Clinical Impression   PTA, pt was independent with ADL and used rollator for functional mobility. Pt currently requires max +2 assist for functional mobility and for ADL completion due to severe L hip pain. Pt only able to tolerate bed mobility this session and declined to attempt sitting EOB despite encouragement for early mobilization. Pt will benefit from continued acute OT to maximize independence and safety with ADL and functional mobility. Recommend SNF for post-acute rehab stay. OT will continue to follow acutely.    Follow Up Recommendations  SNF;Supervision/Assistance - 24 hour    Equipment Recommendations  Other (comment) (TBD at next venue of care)    Recommendations for Other Services       Precautions / Restrictions Precautions Precautions: Fall Restrictions Weight Bearing Restrictions: Yes LLE Weight Bearing: Weight bearing as tolerated      Mobility Bed Mobility Overal bed mobility: Needs Assistance Bed Mobility: Rolling Rolling: Mod assist         General bed mobility comments: Mod assist provided to move LLE while rolling in bed. Pt unable to fully roll to L side due to pain. Increased time and use of bedrails required to achieve semi-sidelying position. Pt declined to attempt sitting on EOB due to severe pain despite encouragement for ealy mobilization.  Transfers                 General transfer comment: NT - unable    Balance                                           ADL either performed or assessed with clinical judgement   ADL Overall ADL's : Needs assistance/impaired Eating/Feeding:  Set up   Grooming: Wash/dry hands;Wash/dry face;Set up;Bed level   Upper Body Bathing: Moderate assistance;Bed level   Lower Body Bathing: Total assistance;+2 for physical assistance;Bed level                         General ADL Comments: Pt declined to progress further than bed mobility this session due to severe pain.      Vision Baseline Vision/History: Wears glasses Wears Glasses: Reading only Patient Visual Report: No change from baseline Vision Assessment?: No apparent visual deficits     Perception     Praxis      Pertinent Vitals/Pain Pain Assessment: 0-10 Pain Score: 8  Pain Location: Lt hip/leg Pain Descriptors / Indicators: Aching;Grimacing;Sore Pain Intervention(s): Limited activity within patient's tolerance;Monitored during session;Repositioned;Premedicated before session     Hand Dominance Right   Extremity/Trunk Assessment Upper Extremity Assessment Upper Extremity Assessment: Generalized weakness   Lower Extremity Assessment Lower Extremity Assessment: Generalized weakness;LLE deficits/detail LLE Deficits / Details: Decreased strength and ROM post-op       Communication Communication Communication: No difficulties   Cognition Arousal/Alertness: Awake/alert Behavior During Therapy: WFL for tasks assessed/performed;Anxious Overall Cognitive Status: Within Functional Limits for tasks assessed  General Comments       Exercises     Shoulder Instructions      Home Living Family/patient expects to be discharged to:: Skilled nursing facility Living Arrangements: Children (daughter and son-in-law) Available Help at Discharge: Family;Available 24 hours/day Type of Home: House       Home Layout: One level     Bathroom Shower/Tub: Producer, television/film/videoWalk-in shower   Bathroom Toilet: Handicapped height Bathroom Accessibility: Yes How Accessible: Accessible via wheelchair Home Equipment: Grab bars -  toilet;Grab bars - tub/shower;Hand held shower head;Wheelchair - [employer] Corporationmanual;Walker - 4 wheels          Prior Functioning/Environment Level of Independence: Independent with assistive device(s)        Comments: Was using rollator for short distance and manual wheelchair for long-distance mobility        OT Problem List: Decreased strength;Decreased range of motion;Decreased activity tolerance;Impaired balance (sitting and/or standing);Decreased safety awareness;Decreased knowledge of use of DME or AE;Decreased knowledge of precautions;Pain;Obesity      OT Treatment/Interventions: Self-care/ADL training;Therapeutic exercise;Energy conservation;DME and/or AE instruction;Therapeutic activities;Patient/family education;Balance training    OT Goals(Current goals can be found in the care plan section) Acute Rehab OT Goals Patient Stated Goal: To walk OT Goal Formulation: With patient Time For Goal Achievement: 02/05/17 Potential to Achieve Goals: Good  OT Frequency: Min 2X/week   Barriers to D/C:            Co-evaluation              AM-PAC PT "6 Clicks" Daily Activity     Outcome Measure Help from another person eating meals?: A Little Help from another person taking care of personal grooming?: A Lot Help from another person toileting, which includes using toliet, bedpan, or urinal?: Total Help from another person bathing (including washing, rinsing, drying)?: Total Help from another person to put on and taking off regular upper body clothing?: Total Help from another person to put on and taking off regular lower body clothing?: Total 6 Click Score: 9   End of Session Equipment Utilized During Treatment: Oxygen Nurse Communication: Mobility status  Activity Tolerance: Patient limited by pain Patient left: in bed;with call bell/phone within reach  OT Visit Diagnosis: Unsteadiness on feet (R26.81)                Time: [id number] OT Time Calculation (min): 23 min Charges:   OT General Charges $OT Visit: 1 Procedure OT Evaluation $OT Eval Moderate Complexity: 1 Procedure G-Codes:     Nils PyleJulia Naviyah Schaffert, MS, OTR/L 01/22/2017, 11:33 AM

## 2017-01-22 NOTE — Progress Notes (Signed)
Patient stable.  Pain reasonably well-controlled.  Patient sat up in bed yesterday but did not get to the chair.  She states she is rolling around in bed for motion.  Ankle dorsi flexion plantar flexion intact on the left.  Plan is continued occupational and physical therapy with likely skilled nursing home placement this week

## 2017-01-22 NOTE — Progress Notes (Signed)
Progress Note    Lindsey Sutton  UUV:[id number] DOB: 11/11/1945  DOA: 01/19/2017 PCP: Pola CornBrown, Rita Harbison, NP    Brief Narrative:   Chief complaint: Follow-up left hip fracture  Medical records reviewed and are as summarized below:  Lindsey PearCarol Biever is an 71 y.o. female with a PMH of ESRD on HD, atrial fibrillation status post pacemaker, chronic iron deficiency anemia, diabetes, and anxiety who was admitted 01/19/17 with chief complaint of left hip pain after suffering from a mechanical fall resulting in a hip fracture.  Assessment/Plan:   Principal Problem:   Hip fx (HCC)/Nondisplaced intertrochanteric fracture of the left femur Underwent intramedullary implant 01/20/17 by Dr. Roda ShuttersXu after being medically optimized. Continue Norco for pain control. PT/OT recommending SNF placement. The patient has picked a place near [place] that she would like to go to, hopefully will be ready in the next 24 hours.  Active Problems:   Thrombocytopenia May be related to her fatty liver disease, stable. Continue to monitor periodically.    Hypertension Blood pressure low this morning, hold antihypertensives.    Hyponatremia Mild, monitoring. Likely secondary to cirrhosis physiology given fatty liver disease.    Atrial fibrillation status post Pacemaker CHADS2Vasc = 4. Not on chronic anticoagulation. 2-D echo showed an EF of 55-60 percent, no regional wall motion abnormalities. Diastolic function could not be evaluated.    Iron deficiency anemia/acute blood loss anemia 2 g drop in hemoglobin from admission values, likely from operative blood loss. Received ESA/iron with dialysis 01/21/17. Continue  Nulecit and folic acid. Continue to monitor. No current indication for transfusion.    Diabetes mellitus with renal complications Hemoglobin A1c 5.4%. Glipizide on hold.Currently being managed with insulin sensitive SSI Q AC/HS. CBGs 138-172.    History of Systolic heart failure (HCC) Cardiomegaly on chest  x-ray. No evidence of systolic dysfunction based on echo.    ESRD (end stage renal disease) (HCC)/secondary hyperparathyroidism/hyperphosphatemia Hemodialysis was done 01/20/17. Nephrology following. HD every Saturday, Tuesday and Thursday. Continue Sensipar, calcitriol and Renvela.    Ascites/fatty liver disease History of paracentesis in the past. Right upper quadrant ultrasound showed a fatty liver.Spoke with patient about weight loss.   Obesity (BMI 30-39.9) Body mass index is 33.84 kg/m. Encouraged weight loss.  COPD Continue Dulera.   Moderate to severe aortic stenosis Noted on 2 D echo.    Family Communication/Anticipated D/C date and plan/Code Status   DVT prophylaxis: Lovenox ordered. Code Status: Full Code.  Family Communication: Multiple family updated at the bedside 01/20/17, none present today. Disposition Plan: Likely will need SNF.   Medical Consultants:    Orthopedic Surgery  Nephrology   Procedures:   2 D Echo 01/21/17:   Impressions:  - LVEF 55-60, severe LVH, moderate to severe aortic stenosis,   mildly dilated ascending aorta to 3.9 cm, trivial MR, severe   biatrial enlargement, pacemaker wires noted, moderate TR, RVSP 61   mmHg, dilated IVC.  Anti-Infectives:    None  Subjective:   Having level 8/10 left hip pain.  Says "I feel awful".  No nausea or vomiting, chest pain or shortness of breath. Last BM was this morning. Got up with PT yesterday.  Objective:    Vitals:   01/21/17 1830 01/21/17 1851 01/21/17 2106 01/22/17 0433  BP: (!) 95/57 111/67 (!) 84/48 96/60  Pulse: 60 60 60 60  Resp:  16 16 18   Temp:  97.3 F (36.3 C) 97.7 F (36.5 C) 97.7 F (36.5 C)  TempSrc:  Oral  Oral Oral  SpO2:  98% 98% 100%  Weight:  98 kg (216 lb 0.8 oz)    Height:        Intake/Output Summary (Last 24 hours) at 01/22/17 0830 Last data filed at 01/21/17 1955  Gross per 24 hour  Intake              230 ml  Output             1986 ml  Net             -1756 ml   Filed Weights   01/20/17 1135 01/21/17 1515 01/21/17 1851  Weight: 98 kg (216 lb 0.8 oz) 100.2 kg (220 lb 14.4 oz) 98 kg (216 lb 0.8 oz)    Exam: General exam: Appears a bit drowsy. Lying in bed. Respiratory system: Clear to auscultation, decreased breath sounds. Respiratory effort normal. Cardiovascular system: HSIR. No JVD,  rubs, gallops or clicks. II/VI murmur. Gastrointestinal system: Abdomen is softly ndistended, nontender. No organomegaly or masses felt. Normal bowel sounds heard. Central nervous system: Drowsy. No focal neurological deficits. Extremities: No clubbing, or cyanosis. Left wrist AVG with +bruit. Left lateral thigh dressing CDI. Skin: No rashes, lesions or ulcers. Psychiatry: Judgement and insight appear normal. Mood & affect flat.   Data Reviewed:   I have personally reviewed following labs and imaging studies:  Labs: Basic Metabolic Panel:  Recent Labs Lab 01/19/17 1147 01/20/17 0608 01/20/17 0613 01/20/17 2104 01/21/17 0805 01/22/17 0311  NA 131* 129*  --   --  130* 131*  K 4.1 4.4  --   --  4.0 3.8  CL 94* 93*  --   --  94* 91*  CO2 18* 20*  --   --  20* 24  GLUCOSE 115* 101*  --   --  156* 162*  BUN 65* 71*  --   --  50* 38*  CREATININE 6.64* 7.25*  --  5.19* 5.35* 4.73*  CALCIUM 8.9 8.8*  --   --  8.2* 8.5*  PHOS  --   --  9.7*  --   --  7.2*   GFR Estimated Creatinine Clearance: 13.3 mL/min (A) (by C-G formula based on SCr of 4.73 mg/dL (H)). Liver Function Tests:  Recent Labs Lab 01/19/17 1630 01/20/17 0613 01/22/17 0311  AST 15  --   --   ALT 8*  --   --   ALKPHOS 65  --   --   BILITOT 1.3*  --   --   PROT 7.0  --   --   ALBUMIN 3.7 3.4* 3.3*   No results for input(s): LIPASE, AMYLASE in the last 168 hours. No results for input(s): AMMONIA in the last 168 hours. Coagulation profile  Recent Labs Lab 01/19/17 1147  INR 1.35    CBC:  Recent Labs Lab 01/19/17 1147 01/20/17 0608 01/20/17 2104 01/21/17 0805  01/22/17 0311  WBC 3.6* 4.0 4.6 2.8* 3.7*  NEUTROABS 2.7  --   --   --   --   HGB 10.2* 10.2* 9.6* 8.3* 8.2*  HCT 32.4* 32.5* 31.0* 27.0* 26.6*  MCV 89.3 89.5 90.6 90.0 91.7  PLT 102* 85* 87* 75* 88*   Cardiac Enzymes: No results for input(s): CKTOTAL, CKMB, CKMBINDEX, TROPONINI in the last 168 hours. BNP (last 3 results) No results for input(s): PROBNP in the last 8760 hours. CBG:  Recent Labs Lab 01/20/17 2057 01/21/17 0624 01/21/17 1159 01/21/17 2101 01/22/17 0629  GLUCAP 146* 172*  151* 138* 165*   D-Dimer: No results for input(s): DDIMER in the last 72 hours. Hgb A1c:  Recent Labs  01/19/17 1147  HGBA1C 5.4    Microbiology Recent Results (from the past 240 hour(s))  Surgical pcr screen     Status: None   Collection Time: 01/19/17  5:10 PM  Result Value Ref Range Status   MRSA, PCR NEGATIVE NEGATIVE Final   Staphylococcus aureus NEGATIVE NEGATIVE Final    Comment:        The Xpert SA Assay (FDA approved for NASAL specimens in patients over 44 years of age), is one component of a comprehensive surveillance program.  Test performance has been validated by [facility] for patients greater than or equal to 72 year old. It is not intended to diagnose infection nor to guide or monitor treatment.     Radiology: Dg C-arm 1-60 Min  Result Date: 01/20/2017 CLINICAL DATA:  Left intramedullary nail. EXAM: OPERATIVE LEFT HIP (WITH PELVIS IF PERFORMED) 2 VIEWS TECHNIQUE: Fluoroscopic spot image(s) were submitted for interpretation post-operatively. COMPARISON:  Radiography from yesterday FINDINGS: Fluoroscopy showing proximal femur fracture reduction and internal fixation. No unexpected finding. IMPRESSION: Fluoroscopy for left femur fracture ORIF.  No unexpected finding. Electronically Signed   By: Marnee Spring M.D.   On: 01/20/2017 16:40   Dg Hip Operative Unilat W Or W/o Pelvis Left  Result Date: 01/20/2017 CLINICAL DATA:  Left intramedullary nail. EXAM:  OPERATIVE LEFT HIP (WITH PELVIS IF PERFORMED) 2 VIEWS TECHNIQUE: Fluoroscopic spot image(s) were submitted for interpretation post-operatively. COMPARISON:  Radiography from yesterday FINDINGS: Fluoroscopy showing proximal femur fracture reduction and internal fixation. No unexpected finding. IMPRESSION: Fluoroscopy for left femur fracture ORIF.  No unexpected finding. Electronically Signed   By: Marnee Spring M.D.   On: 01/20/2017 16:40    Medications:   . calcitRIOL  1 mcg Oral Q T,Th,Sa-HD  . cinacalcet  60 mg Oral Q T,Th,Sa-HD  . cloNIDine  0.3 mg Oral TID  . darbepoetin (ARANESP) injection - DIALYSIS  200 mcg Intravenous Q Sat-HD  . docusate sodium  100 mg Oral BID  . enoxaparin (LOVENOX) injection  30 mg Subcutaneous Q24H  . feeding supplement (NEPRO CARB STEADY)  237 mL Oral Q1500  . folic acid  1 mg Oral BID  . furosemide  40 mg Oral BID  . insulin aspart  0-5 Units Subcutaneous QHS  . insulin aspart  0-9 Units Subcutaneous TID WC  . mometasone-formoterol  2 puff Inhalation BID  . sevelamer carbonate  800 mg Oral TID WC  . traZODone  50 mg Oral QHS   Continuous Infusions: . sodium chloride 10 mL/hr at 01/21/17 0833  . ferric gluconate (FERRLECIT/NULECIT) IV 125 mg (01/21/17 1731)  . methocarbamol (ROBAXIN)  IV      Medical decision making is of moderate complexity and this patient is at high risk of deterioration, therefore this is a level 3 visit.  (> 4 problem points, 2 data points, moderate risk)   LOS: 3 days   Lexington Krotz  [facility] Pager [phone]. If unable to reach me by pager, please call my cell phone at [phone].  *Please refer to [email or web address], password [email or web address] to get updated schedule on who will round on this patient, as hospitalists switch teams weekly. If 7PM-7AM, please contact night-coverage at [email or web address], password [email or web address] for any overnight needs.  01/22/2017, 8:30 AM    .Wetzel Bjornstad

## 2017-01-22 NOTE — Progress Notes (Signed)
[facility] Progress Note   Subjective: No new C/Os. Family at bedside.   Objective Vitals:   01/21/17 1830 01/21/17 1851 01/21/17 2106 01/22/17 0433  BP: (!) 95/57 111/67 (!) 84/48 96/60  Pulse: 60 60 60 60  Resp:  16 16 18   Temp:  97.3 F (36.3 C) 97.7 F (36.5 C) 97.7 F (36.5 C)  TempSrc:  Oral Oral Oral  SpO2:  98% 98% 100%  Weight:  98 kg (216 lb 0.8 oz)    Height:       Physical Exam General: WNWD NAD Heart: S1,S2, 2/6 systolic M Lungs: CTAB A/P Abdomen: obese, non-tender Extremities:No LE edema. Wound R & L 5th toes. Bruising noted L hip, Drsg intact Dialysis Access: LFA AVF + bruit   Additional Objective Labs: Basic Metabolic Panel:  Recent Labs Lab 01/20/17 0608 01/20/17 [id number] 01/20/17 2104 01/21/17 0805 01/22/17 0311  NA 129*  --   --  130* 131*  K 4.4  --   --  4.0 3.8  CL 93*  --   --  94* 91*  CO2 20*  --   --  20* 24  GLUCOSE 101*  --   --  156* 162*  BUN 71*  --   --  50* 38*  CREATININE 7.25*  --  5.19* 5.35* 4.73*  CALCIUM 8.8*  --   --  8.2* 8.5*  PHOS  --  9.7*  --   --  7.2*   Liver Function Tests:  Recent Labs Lab 01/19/17 1630 01/20/17 0613 01/22/17 0311  AST 15  --   --   ALT 8*  --   --   ALKPHOS 65  --   --   BILITOT 1.3*  --   --   PROT 7.0  --   --   ALBUMIN 3.7 3.4* 3.3*   No results for input(s): LIPASE, AMYLASE in the last 168 hours. CBC:  Recent Labs Lab 01/19/17 1147 01/20/17 0608 01/20/17 2104 01/21/17 0805 01/22/17 0311  WBC 3.6* 4.0 4.6 2.8* 3.7*  NEUTROABS 2.7  --   --   --   --   HGB 10.2* 10.2* 9.6* 8.3* 8.2*  HCT 32.4* 32.5* 31.0* 27.0* 26.6*  MCV 89.3 89.5 90.6 90.0 91.7  PLT 102* 85* 87* 75* 88*   Blood Culture No results found for: SDES, SPECREQUEST, CULT, REPTSTATUS  Cardiac Enzymes: No results for input(s): CKTOTAL, CKMB, CKMBINDEX, TROPONINI in the last 168 hours. CBG:  Recent Labs Lab 01/20/17 2057 01/21/17 0624 01/21/17 1159 01/21/17 2101 01/22/17 0629  GLUCAP  146* 172* 151* 138* 165*   Studies/Results: Dg C-arm 1-60 Min  Result Date: 01/20/2017 CLINICAL DATA:  Left intramedullary nail. EXAM: OPERATIVE LEFT HIP (WITH PELVIS IF PERFORMED) 2 VIEWS TECHNIQUE: Fluoroscopic spot image(s) were submitted for interpretation post-operatively. COMPARISON:  Radiography from yesterday FINDINGS: Fluoroscopy showing proximal femur fracture reduction and internal fixation. No unexpected finding. IMPRESSION: Fluoroscopy for left femur fracture ORIF.  No unexpected finding. Electronically Signed   By: Marnee SpringJonathon  Watts M.D.   On: 01/20/2017 16:40   Dg Hip Operative Unilat W Or W/o Pelvis Left  Result Date: 01/20/2017 CLINICAL DATA:  Left intramedullary nail. EXAM: OPERATIVE LEFT HIP (WITH PELVIS IF PERFORMED) 2 VIEWS TECHNIQUE: Fluoroscopic spot image(s) were submitted for interpretation post-operatively. COMPARISON:  Radiography from yesterday FINDINGS: Fluoroscopy showing proximal femur fracture reduction and internal fixation. No unexpected finding. IMPRESSION: Fluoroscopy for left femur fracture ORIF.  No unexpected finding. Electronically Signed   By: Marja KaysJonathon  Watts M.D.   On: 01/20/2017 16:40   Medications: . sodium chloride 10 mL/hr at 01/21/17 0833  . ferric gluconate (FERRLECIT/NULECIT) IV 125 mg (01/21/17 1731)  . methocarbamol (ROBAXIN)  IV     . calcitRIOL  1 mcg Oral Q T,Th,Sa-HD  . cinacalcet  60 mg Oral Q T,Th,Sa-HD  . darbepoetin (ARANESP) injection - DIALYSIS  200 mcg Intravenous Q Sat-HD  . docusate sodium  100 mg Oral BID  . enoxaparin (LOVENOX) injection  30 mg Subcutaneous Q24H  . feeding supplement (NEPRO CARB STEADY)  237 mL Oral Q1500  . folic acid  1 mg Oral BID  . insulin aspart  0-5 Units Subcutaneous QHS  . insulin aspart  0-9 Units Subcutaneous TID WC  . mometasone-formoterol  2 puff Inhalation BID  . sevelamer carbonate  800 mg Oral TID WC  . traZODone  50 mg Oral QHS     Dialysis Orders: L[place] T,Th,S 4 hrs 180 NRe 94 kg 2.0  K/2.0 Ca 450/800 -Heparin 5000 units TIW -Senipar 30 mg PO TIW (Last PTH 1453 01/14/17) -Calcitriol 1.0 mcg PO TIW -Mircera 225 mcg IV q 2 weeks (last dose 01/10/17 HGB 10.5 01/14/17) -Venofer 50 mg IV weekly (last dose 01/14/17 Ferritin 977 Fe 42 TSat 19% 01/14/17)  Assessment/Plan: 1. L. Femoral neck Fx-S/P intertrochanteric fracture with intramedullary implant per Dr. Roda Shutters. PO day 2 2. ESRD -Had HD 05/04 and 01/21/17. Next Tx on 01/24/17.  3. Anemia - HGB down 8.2 today. Rec'd Aranesp 200 mcg IV with HD 01/21/17. Cont Fe load.  Hb down 8.2 today, baseline 10-11.  BP's low as well.  Will consider transfusion if BP's don't come up.  4. Secondary hyperparathyroidism - Cont sensipar, binders, VDRA. Phos down 7.2. Non-adherent to BMD meds.  5. HTN/volume - HD yesterday Pre wt 100.2 kg  Net UF 1986 Post wt 98 kg. Now 4 kg above OP EDW. Push to goal.  Still 4kg up by wts, her OP dry wt may be incorrect ("never gets to dry wt", clear lung, clear CXR, no LE edema).  BP's soft d/t pain meds today.  Puts fluid in abdomen per family, and abd is "down" today.  6. Nutrition - Renal diet, nepro renal vits.  Rita H. Brown NP-C 01/22/2017, 11:36 AM  [facility] [phone]  Pt seen, examined, agree w assess/plan as above with additions as indicated. Clonidine stopped for low BP's this am.  If BP's remain low, would rec 1 unit prbc transfusion today, have d/w primary.  Vinson Moselle MD [employer] pager [phone]    cell [phone] 01/22/2017, 2:36 PM

## 2017-01-23 ENCOUNTER — Encounter (HOSPITAL_COMMUNITY): Payer: Self-pay | Admitting: Orthopaedic Surgery

## 2017-01-23 DIAGNOSIS — D61818 Other pancytopenia: Secondary | ICD-10-CM | POA: Diagnosis present

## 2017-01-23 LAB — RENAL FUNCTION PANEL
ALBUMIN: 3.4 g/dL — AB (ref 3.5–5.0)
ANION GAP: 15 (ref 5–15)
BUN: 47 mg/dL — ABNORMAL HIGH (ref 6–20)
CALCIUM: 8.6 mg/dL — AB (ref 8.9–10.3)
CO2: 24 mmol/L (ref 22–32)
Chloride: 92 mmol/L — ABNORMAL LOW (ref 101–111)
Creatinine, Ser: 5.73 mg/dL — ABNORMAL HIGH (ref 0.44–1.00)
GFR calc non Af Amer: 7 mL/min — ABNORMAL LOW (ref >60)
GFR, EST AFRICAN AMERICAN: 8 mL/min — AB (ref >60)
Glucose, Bld: 103 mg/dL — ABNORMAL HIGH (ref 65–99)
PHOSPHORUS: 7.8 mg/dL — AB (ref 2.5–4.6)
Potassium: 3.8 mmol/L (ref 3.5–5.1)
Sodium: 131 mmol/L — ABNORMAL LOW (ref 135–145)

## 2017-01-23 LAB — GLUCOSE, CAPILLARY
GLUCOSE-CAPILLARY: 139 mg/dL — AB (ref 65–99)
GLUCOSE-CAPILLARY: 156 mg/dL — AB (ref 65–99)
Glucose-Capillary: 104 mg/dL — ABNORMAL HIGH (ref 65–99)
Glucose-Capillary: 115 mg/dL — ABNORMAL HIGH (ref 65–99)

## 2017-01-23 LAB — CBC
HEMATOCRIT: 25.6 % — AB (ref 36.0–46.0)
Hemoglobin: 7.8 g/dL — ABNORMAL LOW (ref 12.0–15.0)
MCH: 27.9 pg (ref 26.0–34.0)
MCHC: 30.5 g/dL (ref 30.0–36.0)
MCV: 91.4 fL (ref 78.0–100.0)
Platelets: 84 10*3/uL — ABNORMAL LOW (ref 150–400)
RBC: 2.8 MIL/uL — ABNORMAL LOW (ref 3.87–5.11)
RDW: 17.1 % — ABNORMAL HIGH (ref 11.5–15.5)
WBC: 3.9 10*3/uL — AB (ref 4.0–10.5)

## 2017-01-23 LAB — TYPE AND SCREEN
ABO/RH(D): A POS
ANTIBODY SCREEN: POSITIVE
DAT, IGG: NEGATIVE

## 2017-01-23 LAB — PREPARE RBC (CROSSMATCH)

## 2017-01-23 LAB — ABO/RH: ABO/RH(D): A POS

## 2017-01-23 MED ORDER — SODIUM CHLORIDE 0.9 % IV SOLN: Freq: Once | INTRAVENOUS | Status: DC

## 2017-01-23 MED ORDER — HEPARIN SODIUM (PORCINE) 5000 UNIT/ML IJ SOLN
5000.0000 [IU] | Freq: Three times a day (TID) | INTRAMUSCULAR | Status: DC
  Administered 2017-01-23 – 2017-01-26 (×9): 5000 [IU] via SUBCUTANEOUS
  Filled 2017-01-23 (×9): qty 1

## 2017-01-23 MED ORDER — MORPHINE SULFATE (PF) 4 MG/ML IV SOLN
INTRAVENOUS | Status: AC
  Administered 2017-01-23: 4 mg via INTRAVENOUS
  Filled 2017-01-23: qty 1

## 2017-01-23 MED ORDER — OXYCODONE HCL 5 MG PO TABS
10.0000 mg | ORAL_TABLET | ORAL | Status: DC | PRN
  Administered 2017-01-23 – 2017-01-24 (×3): 10 mg via ORAL
  Administered 2017-01-24: 15 mg via ORAL
  Administered 2017-01-24: 10 mg via ORAL
  Filled 2017-01-23 (×5): qty 2

## 2017-01-23 MED ORDER — MORPHINE SULFATE (PF) 4 MG/ML IV SOLN
4.0000 mg | Freq: Once | INTRAVENOUS | Status: AC
  Administered 2017-01-23: 4 mg via INTRAVENOUS

## 2017-01-23 NOTE — [place] FL2 (Signed)
[place] MEDICAID FL2 LEVEL OF CARE SCREENING TOOL     IDENTIFICATION  Patient Name: Lindsey Sutton Birthdate: 04/07/1946 Sex: female Admission Date (Current Location): 01/19/2017  [facility] [place]   Facility and Address:  [facility] [place], [address], [place], [place] [address]      Provider Number: [id number]  Attending Physician Name and Address:  Rama, Maryruth Bunhristina P, MD  Relative Name and Phone Number:       Current Level of Care: Hospital Recommended Level of Care: Skilled Nursing Facility Prior Approval Number:    Date Approved/Denied: 01/23/17 PASRR Number: [phone]  Discharge Plan: SNF    Current Diagnoses: Patient Active Problem List   Diagnosis Date Noted  . Secondary hyperparathyroidism of renal origin (HCC) 01/22/2017  . Hyperphosphatemia 01/22/2017  . Aortic stenosis, moderate-severe 01/22/2017  . Essential hypertension 01/21/2017  . Hyponatremia 01/21/2017  . NAFLD (nonalcoholic fatty liver disease) 40/10/272505/01/2017  . Thrombocytopenia (HCC) 01/21/2017  . Obesity (BMI 30-39.9) 01/20/2017  . Hip fx (HCC) 01/19/2017  . A-fib (HCC) 01/19/2017  . ESRD (end stage renal disease) (HCC) 01/19/2017  . Pacemaker   . Iron deficiency anemia   . Systolic heart failure (HCC)   . Ascites   . DM (diabetes mellitus), type 2 with renal complications (HCC)   . Nondisplaced intertrochanteric fracture of left femur, initial encounter for closed fracture (HCC)     Orientation RESPIRATION BLADDER Height & Weight     Self, Time, Situation, Place  O2 (Nasal Cannula 2 L) Continent Weight: 216 lb 0.8 oz (98 kg) Height:  5\' 7"  (170.2 cm)  BEHAVIORAL SYMPTOMS/MOOD NEUROLOGICAL BOWEL NUTRITION STATUS      Continent Diet (See DC Summary)  AMBULATORY STATUS COMMUNICATION OF NEEDS Skin   Extensive Assist Verbally Surgical wounds (Left HIp incision, foam dressing)                       Personal Care Assistance Level of Assistance   Bathing, Feeding, Dressing Bathing Assistance: Maximum assistance Feeding assistance: Independent Dressing Assistance: Limited assistance     Functional Limitations Info  Sight, Hearing, Speech Sight Info: Adequate Hearing Info: Adequate Speech Info: Adequate    SPECIAL CARE FACTORS FREQUENCY  PT (By licensed PT), OT (By licensed OT)     PT Frequency: 5xweek OT Frequency: 5xweek            Contractures      Additional Factors Info  Code Status, Allergies, Insulin Sliding Scale, Psychotropic Code Status Info: Full Allergies Info: PENICILLINS  Psychotropic Info: Trazadone Insulin Sliding Scale Info: 5 units at bedtime; 9 uits x's a day       Current Medications (01/23/2017):  This is the current hospital active medication list Current Facility-Administered Medications  Medication Dose Route Frequency Provider Last Rate Last Dose  . 0.9 %  sodium chloride infusion   Intravenous Continuous Tarry KosXu, Naiping M, MD 10 mL/hr at 01/21/17 [phone]    . 0.9 %  sodium chloride infusion   Intravenous Once Bufford ButtnerUpton, Elizabeth, MD      . acetaminophen (TYLENOL) tablet 650 mg  650 mg Oral Q6H PRN Tarry KosXu, Naiping M, MD   650 mg at 01/23/17 [id number]   Or  . acetaminophen (TYLENOL) suppository 650 mg  650 mg Rectal Q6H PRN Tarry KosXu, Naiping M, MD      . alum & mag hydroxide-simeth (MAALOX/MYLANTA) 200-200-20 MG/5ML suspension 30 mL  30 mL Oral Q4H PRN Tarry KosXu, Naiping M, MD      .  calcitRIOL (ROCALTROL) capsule 1 mcg  1 mcg Oral Q T,Th,Sa-HD [name], NP   1 mcg at 01/21/17 1625  . cinacalcet (SENSIPAR) tablet 60 mg  60 mg Oral Q T,Th,Sa-HD [name], NP      . Darbepoetin Alfa (ARANESP) injection 200 mcg  200 mcg Intravenous Q Sat-HD [name], NP   200 mcg at 01/21/17 1732  . diphenhydrAMINE (BENADRYL) capsule 25 mg  25 mg Oral QHS PRN Gwenyth Bender, NP      . docusate sodium (COLACE) capsule 100 mg  100 mg Oral BID Toya Smothers M, NP   100 mg at 01/23/17 0919  . feeding supplement  (NEPRO CARB STEADY) liquid 237 mL  237 mL Oral Q1500 Rama, Maryruth Bun, MD      . ferric gluconate (NULECIT) 125 mg in sodium chloride 0.9 % 100 mL IVPB  125 mg Intravenous Q T,Th,Sa-HD [name], NP   Stopped at 01/21/17 1831  . folic acid (FOLVITE) tablet 1 mg  1 mg Oral BID Gwenyth Bender, NP   1 mg at 01/23/17 0919  . heparin injection 5,000 Units  5,000 Units Subcutaneous Q8H Bufford Buttner, MD   5,000 Units at 01/23/17 1006  . insulin aspart (novoLOG) injection 0-5 Units  0-5 Units Subcutaneous QHS Black, Karen M, NP      . insulin aspart (novoLOG) injection 0-9 Units  0-9 Units Subcutaneous TID WC Gwenyth Bender, NP   1 Units at 01/22/17 1245  . menthol-cetylpyridinium (CEPACOL) lozenge 3 mg  1 lozenge Oral PRN Tarry Kos, MD       Or  . phenol (CHLORASEPTIC) mouth spray 1 spray  1 spray Mouth/Throat PRN Tarry Kos, MD      . methocarbamol (ROBAXIN) tablet 500 mg  500 mg Oral Q6H PRN Tarry Kos, MD   500 mg at 01/23/17 1158   Or  . methocarbamol (ROBAXIN) 500 mg in dextrose 5 % 50 mL IVPB  500 mg Intravenous Q6H PRN Tarry Kos, MD      . metoCLOPramide (REGLAN) tablet 5 mg  5 mg Oral Q8H PRN Tarry Kos, MD       Or  . metoCLOPramide (REGLAN) injection 5 mg  5 mg Intravenous Q8H PRN Tarry Kos, MD      . mometasone-formoterol [facility]) 200-5 MCG/ACT inhaler 2 puff  2 puff Inhalation BID Gwenyth Bender, NP   2 puff at 01/23/17 [phone]  . morphine 4 MG/ML injection 0.52 mg  0.52 mg Intravenous Q2H PRN Tarry Kos, MD      . ondansetron [facility]) tablet 4 mg  4 mg Oral Q6H PRN Tarry Kos, MD       Or  . ondansetron [facility]) injection 4 mg  4 mg Intravenous Q6H PRN Tarry Kos, MD      . oxyCODONE (Oxy IR/ROXICODONE) immediate release tablet 10-15 mg  10-15 mg Oral Q4H PRN Rama, Maryruth Bun, MD      . sevelamer carbonate (RENVELA) tablet 800 mg  800 mg Oral TID WC Gwenyth Bender, NP   800 mg at 01/23/17 1158  . traZODone (DESYREL) tablet 50 mg  50 mg Oral QHS  Gwenyth Bender, NP   50 mg at 01/22/17 2123  . zolpidem (AMBIEN) tablet 5 mg  5 mg Oral QHS PRN Gwenyth Bender, NP   5 mg at 01/22/17 9604     Discharge Medications: Please see discharge  summary for a list of discharge medications.  Relevant Imaging Results:  Relevant Lab Results:   Additional Information [id number]  Tresa Moore, LCSW

## 2017-01-23 NOTE — Progress Notes (Addendum)
Physical Therapy Treatment Patient Details Name: Nilam Quakenbush MRN: [id number] DOB: 02/22/1946 Today's Date: 01/23/2017    History of Present Illness Patient is a 71 yo female admitted 01/19/17 following fall with Lt hip fx.  Patient s/p IM nailing of fracture Lt hip.    PMH:  ESRD on HD, HTN, Afib, pacemaker, anemia, DM, anxiety, CHF    PT Comments    Pt performed multiple transfers this pm to attempt to use commode.  Pt required increased assist of +2 external assist to achieve standing.  Pt remains anxious and slow to respond due to anxiety.    Follow Up Recommendations  SNF;Supervision/Assistance - 24 hour     Equipment Recommendations  Wheelchair (measurements PT);Wheelchair cushion (measurements PT)    Recommendations for Other Services       Precautions / Restrictions Precautions Precautions: Fall Restrictions Weight Bearing Restrictions: Yes RLE Weight Bearing: Weight bearing as tolerated LLE Weight Bearing: Weight bearing as tolerated    Mobility  Bed Mobility           General bed mobility comments: Assisted patient to scoot back in recliner.    Transfers Overall transfer level: Needs assistance Equipment used: Rolling walker (2 wheeled) Transfers: Sit to/from Stand Sit to Stand: Mod assist;+2 physical assistance (required increased assist to stand from recliner to attempt to toilet.  )         General transfer comment: Cues for hand placement, assist to faciliate forward weight shifting and to boost patient into standing.  Pt forward flexed and required assist for extension of trunk.  Pt performed x2 trials from recliner and BSC.  Pt unable to step so chair removed from behind and BSC placed.  Pt more fatigued this pm.   Ambulation/Gait            Stairs            Wheelchair Mobility    Modified Rankin (Stroke Patients Only)       Balance Overall balance assessment: Needs assistance Sitting-balance support: Single extremity  supported;Feet supported Sitting balance-Leahy Scale: Fair Sitting balance - Comments: UE support to maintain balance.     Standing balance-Leahy Scale: Poor Standing balance comment: heavy reliance on UEs.  RW to low will need to elevate next session.                              Cognition Arousal/Alertness: Awake/alert Behavior During Therapy: WFL for tasks assessed/performed;Anxious Overall Cognitive Status: Within Functional Limits for tasks assessed                                        Exercises Total Joint Exercises Ankle Circles/Pumps: AROM;Both;20 reps;Supine Quad Sets: AROM;Left;10 reps;Supine Heel Slides: AAROM;Left;10 reps;Supine    General Comments        Pertinent Vitals/Pain Pain Assessment: 0-10 Pain Score: 6  Pain Location: Lt hip/leg Pain Descriptors / Indicators: Aching;Grimacing;Sore Pain Intervention(s): Monitored during session;Repositioned    Home Living                      Prior Function            PT Goals (current goals can now be found in the care plan section) Acute Rehab PT Goals Patient Stated Goal: To walk Potential to Achieve Goals: Good Progress towards PT goals: Progressing toward  goals    Frequency    Min 4X/week      PT Plan Current plan remains appropriate    Co-evaluation              AM-PAC PT "6 Clicks" Daily Activity  Outcome Measure  Difficulty turning over in bed (including adjusting bedclothes, sheets and blankets)?: Total Difficulty moving from lying on back to sitting on the side of the bed? : Total Difficulty sitting down on and standing up from a chair with arms (e.g., wheelchair, bedside commode, etc,.)?: Total Help needed moving to and from a bed to chair (including a wheelchair)?: A Lot Help needed walking in hospital room?: A Lot Help needed climbing 3-5 steps with a railing? : A Lot 6 Click Score: 9    End of Session Equipment Utilized During Treatment:  Gait belt Activity Tolerance: Patient limited by pain;Patient limited by fatigue Patient left: in bed;with call bell/phone within reach;with family/visitor present;with SCD's reapplied Nurse Communication: Mobility status PT Visit Diagnosis: Unsteadiness on feet (R26.81);Other abnormalities of gait and mobility (R26.89);Muscle weakness (generalized) (M62.81);Pain Pain - Right/Left: Left Pain - part of body: Hip;Leg     Time: 1610-96041159-1210 PT Time Calculation (min) (ACUTE ONLY): 11 min  Charges:   $Therapeutic Activity: 8-22 mins                    G Codes:       Joycelyn RuaAimee Milbern Doescher, PTA pager [phone]    Florestine AversAimee J Jean Skow 01/23/2017, 2:02 PM

## 2017-01-23 NOTE — Progress Notes (Signed)
Physical Therapy Treatment Patient Details Name: Lindsey Sutton MRN: [id number] DOB: 08/31/1946 Today's Date: 01/23/2017    History of Present Illness Patient is a 71 yo female admitted 01/19/17 following fall with Lt hip fx.  Patient s/p IM nailing of fracture Lt hip.    PMH:  ESRD on HD, HTN, Afib, pacemaker, anemia, DM, anxiety, CHF    PT Comments    Pt performed limited therapeutic exercise in bed but able to progress to standing and steps to chair.  Pt continues to benefit from skilled rehab placement [facility].  Will plan to progress gait training next session.  Pt's anxiety continues to limit progress.    Follow Up Recommendations  SNF;Supervision/Assistance - 24 hour     Equipment Recommendations  Wheelchair (measurements PT);Wheelchair cushion (measurements PT)    Recommendations for Other Services       Precautions / Restrictions Precautions Precautions: Fall Restrictions Weight Bearing Restrictions: Yes RLE Weight Bearing: Weight bearing as tolerated    Mobility  Bed Mobility Overal bed mobility: Needs Assistance       Supine to sit: Mod assist     General bed mobility comments: Pt required assistance to advance LEs to edge of bed and elevate trunk into sitting edge of bed.  Pt resistance to mobilize and anxious of falling.  Once sitting edge of bed patient is able to maintain sitting balance unsupported.  Assist to scoot forward with chux pad.    Transfers Overall transfer level: Needs assistance Equipment used: Rolling walker (2 wheeled) Transfers: Sit to/from Stand Sit to Stand: Mod assist         General transfer comment: Cues for hand placement, assist to faciliate forward weight shifting and to boost patient into standing.  Pt forward flexed and required assist for extension of trunk.    Ambulation/Gait Ambulation/Gait assistance:  (steps to chair, RW and mod assist.  Pt able to follow commands to sequencing steps but required mod assistance to  move and position RW.  )               Stairs            Wheelchair Mobility    Modified Rankin (Stroke Patients Only)       Balance Overall balance assessment: Needs assistance Sitting-balance support: Single extremity supported;Feet supported Sitting balance-Leahy Scale: Fair Sitting balance - Comments: UE support to maintain balance.     Standing balance-Leahy Scale: Poor Standing balance comment: heavy reliance on UEs.  RW to low will need to elevate next session.                              Cognition Arousal/Alertness: Awake/alert Behavior During Therapy: WFL for tasks assessed/performed;Anxious Overall Cognitive Status: Within Functional Limits for tasks assessed                                        Exercises Total Joint Exercises Ankle Circles/Pumps: AROM;Both;20 reps;Supine Quad Sets: AROM;Left;10 reps;Supine Heel Slides: AAROM;Left;10 reps;Supine    General Comments        Pertinent Vitals/Pain Pain Assessment: No/denies pain Pain Score: 9  Pain Location: Lt hip/leg Pain Descriptors / Indicators: Aching;Grimacing;Sore Pain Intervention(s): Monitored during session;Repositioned    Home Living  Prior Function            PT Goals (current goals can now be found in the care plan section) Acute Rehab PT Goals Patient Stated Goal: To walk Potential to Achieve Goals: Good Progress towards PT goals: Progressing toward goals    Frequency    Min 4X/week      PT Plan Current plan remains appropriate    Co-evaluation              AM-PAC PT "6 Clicks" Daily Activity  Outcome Measure  Difficulty turning over in bed (including adjusting bedclothes, sheets and blankets)?: Total Difficulty moving from lying on back to sitting on the side of the bed? : Total Difficulty sitting down on and standing up from a chair with arms (e.g., wheelchair, bedside commode, etc,.)?:  Total Help needed moving to and from a bed to chair (including a wheelchair)?: A Lot Help needed walking in hospital room?: A Lot Help needed climbing 3-5 steps with a railing? : A Lot 6 Click Score: 9    End of Session Equipment Utilized During Treatment: Gait belt Activity Tolerance: Patient limited by pain;Patient limited by fatigue Patient left: in bed;with call bell/phone within reach;with family/visitor present;with SCD's reapplied Nurse Communication: Mobility status PT Visit Diagnosis: Unsteadiness on feet (R26.81);Other abnormalities of gait and mobility (R26.89);Muscle weakness (generalized) (M62.81);Pain Pain - Right/Left: Left Pain - part of body: Hip;Leg     Time: 1610-9604 PT Time Calculation (min) (ACUTE ONLY): 28 min  Charges:  $Therapeutic Exercise: 8-22 mins $Therapeutic Activity: 8-22 mins                    G Codes:       [name], PTA pager [phone]    Florestine Avers 01/23/2017, 1:55 PM

## 2017-01-23 NOTE — Clinical Social Work Note (Signed)
Clinical Social Work Assessment  Patient Details  Name: Lindsey Sutton MRN: [id number] Date of Birth: 1946-08-14  Date of referral:  01/23/17               Reason for consult:  Facility Placement                Permission sought to share information with:  Chartered certified accountant granted to share information::  Yes, Verbal Permission Granted  Name::     Publishing rights manager::  SNF  Relationship::  daughter  Contact Information:     Housing/Transportation Living arrangements for the past 2 months:  Single Family Home Source of Information:  Patient, Adult Children Patient Interpreter Needed:  None Criminal Activity/Legal Involvement Pertinent to Current Situation/Hospitalization:  No - Comment as needed Significant Relationships:  Adult Children, Other Family Members Lives with:  Adult Children Do you feel safe going back to the place where you live?  No Need for family participation in patient care:  Yes (Comment)  Care giving concerns:  Patient resided with adult daughter prior to hospitalization.  Social Worker assessment / plan:  CSW met with daughters and patient at bedside to discuss DC recommendations.  CSW explained her role in the DC process. CSW explained the DC plan for SNF placement. Daughter knowledgeable of the SNF process and indicated that she wants mom to go to either [facility] in [place].  CSW discussed the dialysis T, Th, Saturday transport needed. Family aware and indicated that both facilities should be able to transport pt. CSW received permission to send the offer to those facilities.  CSW will f/u.  Employment status:  Retired Forensic scientist:  Medicare PT Recommendations:  [place] / Referral to community resources:  [place]  Patient/Family's Response to care:  Patient and family appreciative of CSW assistance in the DC process. Family has already f/u with  facilities as well. Patient and family agreeable with plan at DC to SNF.  They report no issues or concerns at this time.  Patient/Family's Understanding of and Emotional Response to Diagnosis, Current Treatment, and Prognosis:  Patient and family has good understanding of diagnosis, current treatment and prognosis at DC.  Patient and family hopeful that patient will improve to return home after rehabilitation in stable physical health. They report no issues or concerns at this time.  Emotional Assessment Appearance:  Appears older than stated age Attitude/Demeanor/Rapport:   (Cooperative) Affect (typically observed):  Accepting, Appropriate Orientation:  Oriented to Self, Oriented to Place, Oriented to  Time, Oriented to Situation Alcohol / Substance use:  Not Applicable Psych involvement (Current and /or in the community):  No (Comment)  Discharge Needs  Concerns to be addressed:  Care Coordination Readmission within the last 30 days:  No Current discharge risk:  Dependent with Mobility, Physical Impairment Barriers to Discharge:  No Barriers Identified   Normajean Baxter, LCSW 01/23/2017, 3:08 PM

## 2017-01-23 NOTE — Progress Notes (Signed)
[facility] Progress Note   Subjective: No new C/Os. BP low yesterday, hgb drifting down  Objective Vitals:   01/22/17 2044 01/22/17 2054 01/23/17 0107 01/23/17 0549  BP: (!) 83/49  119/64 116/72  Pulse: 63  71 63  Resp: 16  18 18   Temp: 98 F (36.7 C)   97.9 F (36.6 C)  TempSrc: Oral   Oral  SpO2: 99% 100% 100% 100%  Weight:      Height:       Physical Exam General: WNWD NAD Heart: S1,S2, 2/6 systolic M Lungs: CTAB A/P Abdomen: obese, non-tender, mildly distended Extremities:No LE edema. Wound R & L 5th toes. Bruising noted L hip, Drsg intact Dialysis Access: LFA AVF + bruit   Additional Objective Labs: Basic Metabolic Panel:  Recent Labs Lab 01/20/17 0613  01/21/17 0805 01/22/17 0311 01/23/17 0301  NA  --   --  130* 131* 131*  K  --   --  4.0 3.8 3.8  CL  --   --  94* 91* 92*  CO2  --   --  20* 24 24  GLUCOSE  --   --  156* 162* 103*  BUN  --   --  50* 38* 47*  CREATININE  --   < > 5.35* 4.73* 5.73*  CALCIUM  --   --  8.2* 8.5* 8.6*  PHOS 9.7*  --   --  7.2* 7.8*  < > = values in this interval not displayed. Liver Function Tests:  Recent Labs Lab 01/19/17 1630 01/20/17 0613 01/22/17 0311 01/23/17 0301  AST 15  --   --   --   ALT 8*  --   --   --   ALKPHOS 65  --   --   --   BILITOT 1.3*  --   --   --   PROT 7.0  --   --   --   ALBUMIN 3.7 3.4* 3.3* 3.4*   No results for input(s): LIPASE, AMYLASE in the last 168 hours. CBC:  Recent Labs Lab 01/19/17 1147 01/20/17 0608 01/20/17 2104 01/21/17 0805 01/22/17 0311 01/23/17 0301  WBC 3.6* 4.0 4.6 2.8* 3.7* 3.9*  NEUTROABS 2.7  --   --   --   --   --   HGB 10.2* 10.2* 9.6* 8.3* 8.2* 7.8*  HCT 32.4* 32.5* 31.0* 27.0* 26.6* 25.6*  MCV 89.3 89.5 90.6 90.0 91.7 91.4  PLT 102* 85* 87* 75* 88* 84*   Blood Culture No results found for: SDES, SPECREQUEST, CULT, REPTSTATUS  Cardiac Enzymes: No results for input(s): CKTOTAL, CKMB, CKMBINDEX, TROPONINI in the last 168  hours. CBG:  Recent Labs Lab 01/22/17 0629 01/22/17 1145 01/22/17 1652 01/22/17 2044 01/23/17 0552  GLUCAP 165* 128* 91 103* 104*   Studies/Results: No results found. Medications: . sodium chloride 10 mL/hr at 01/21/17 0833  . ferric gluconate (FERRLECIT/NULECIT) IV Stopped (01/21/17 1831)  . methocarbamol (ROBAXIN)  IV     . calcitRIOL  1 mcg Oral Q T,Th,Sa-HD  . cinacalcet  60 mg Oral Q T,Th,Sa-HD  . darbepoetin (ARANESP) injection - DIALYSIS  200 mcg Intravenous Q Sat-HD  . docusate sodium  100 mg Oral BID  . feeding supplement (NEPRO CARB STEADY)  237 mL Oral Q1500  . folic acid  1 mg Oral BID  . heparin subcutaneous  5,000 Units Subcutaneous Q8H  . insulin aspart  0-5 Units Subcutaneous QHS  . insulin aspart  0-9 Units Subcutaneous TID WC  .  mometasone-formoterol  2 puff Inhalation BID  . sevelamer carbonate  800 mg Oral TID WC  . traZODone  50 mg Oral QHS     Dialysis Orders: [facility] T,Th,S 4 hrs 180 NRe 94 kg 2.0 K/2.0 Ca 450/800 -Heparin 5000 units TIW -Senipar 30 mg PO TIW (Last PTH 1453 01/14/17) -Calcitriol 1.0 mcg PO TIW -Mircera 225 mcg IV q 2 weeks (last dose 01/10/17 HGB 10.5 01/14/17) -Venofer 50 mg IV weekly (last dose 01/14/17 Ferritin 977 Fe 42 TSat 19% 01/14/17)  Assessment/Plan: 1. L. Femoral neck Fx-S/P intertrochanteric fracture with intramedullary implant per Dr. Roda ShuttersXu. PO day 3 2. ESRD -Had HD 05/04 and 01/21/17. Next Tx on 01/24/17.  3. Anemia - HGB down 7.8 today. Rec'd Aranesp 200 mcg IV with HD 01/21/17. Cont Fe load.  Will give 1 u pRBCs with HD tomorrow 4. Secondary hyperparathyroidism - Cont sensipar, binders, VDRA. Phos down 7.2. Non-adherent to BMD meds.  5. HTN/volume - HD yesterday Pre wt 100.2 kg  Net UF 1986 Post wt 98 kg. Now 4 kg above OP EDW. Push to goal.  May need EDW adjustment as outpatient 6. Nutrition - Renal diet, nepro renal vits.  Bufford ButtnerElizabeth Clarabelle Oscarson MD 01/23/2017, 9:21 AM  [facility] [phone]  (pager) Cell [phone]

## 2017-01-23 NOTE — Care Management Important Message (Signed)
Important Message  Patient Details  Name: Lindsey Sutton MRN: [id number] Date of Birth: 01/02/1946   Medicare Important Message Given:  Yes    Latayna Ritchie 01/23/2017, 3:08 PM

## 2017-01-23 NOTE — [place]2 (Signed)
[place] MEDICAID FL2 LEVEL OF CARE SCREENING TOOL     IDENTIFICATION  Patient Name: Lindsey Sutton Birthdate: 04/07/1946 Sex: female Admission Date (Current Location): 01/19/2017  [facility] [place]   Facility and Address:  [facility] [place], [address], [place], [place] [address]      Provider Number: [id number]  Attending Physician Name and Address:  Rama, Maryruth Bunhristina P, MD  Relative Name and Phone Number:       Current Level of Care: Hospital Recommended Level of Care: Skilled Nursing Facility Prior Approval Number:    Date Approved/Denied: 01/23/17 PASRR Number: [phone]  Discharge Plan: SNF    Current Diagnoses: Patient Active Problem List   Diagnosis Date Noted  . Secondary hyperparathyroidism of renal origin (HCC) 01/22/2017  . Hyperphosphatemia 01/22/2017  . Aortic stenosis, moderate-severe 01/22/2017  . Essential hypertension 01/21/2017  . Hyponatremia 01/21/2017  . NAFLD (nonalcoholic fatty liver disease) 40/10/272505/01/2017  . Thrombocytopenia (HCC) 01/21/2017  . Obesity (BMI 30-39.9) 01/20/2017  . Hip fx (HCC) 01/19/2017  . A-fib (HCC) 01/19/2017  . ESRD (end stage renal disease) (HCC) 01/19/2017  . Pacemaker   . Iron deficiency anemia   . Systolic heart failure (HCC)   . Ascites   . DM (diabetes mellitus), type 2 with renal complications (HCC)   . Nondisplaced intertrochanteric fracture of left femur, initial encounter for closed fracture (HCC)     Orientation RESPIRATION BLADDER Height & Weight     Self, Time, Situation, Place  O2 (Nasal Cannula 2 L) Continent Weight: 216 lb 0.8 oz (98 kg) Height:  5\' 7"  (170.2 cm)  BEHAVIORAL SYMPTOMS/MOOD NEUROLOGICAL BOWEL NUTRITION STATUS      Continent Diet (See DC Summary)  AMBULATORY STATUS COMMUNICATION OF NEEDS Skin   Extensive Assist Verbally Surgical wounds (Left HIp incision, foam dressing)                       Personal Care Assistance Level of Assistance   Bathing, Feeding, Dressing Bathing Assistance: Maximum assistance Feeding assistance: Independent Dressing Assistance: Limited assistance     Functional Limitations Info  Sight, Hearing, Speech Sight Info: Adequate Hearing Info: Adequate Speech Info: Adequate    SPECIAL CARE FACTORS FREQUENCY  PT (By licensed PT), OT (By licensed OT)     PT Frequency: 5xweek OT Frequency: 5xweek            Contractures      Additional Factors Info  Code Status, Allergies, Insulin Sliding Scale, Psychotropic Code Status Info: Full Allergies Info: PENICILLINS  Psychotropic Info: Trazadone Insulin Sliding Scale Info: 5 units at bedtime; 9 uits x's a day       Current Medications (01/23/2017):  This is the current hospital active medication list Current Facility-Administered Medications  Medication Dose Route Frequency Provider Last Rate Last Dose  . 0.9 %  sodium chloride infusion   Intravenous Continuous Tarry KosXu, Naiping M, MD 10 mL/hr at 01/21/17 [id number]    . 0.9 %  sodium chloride infusion   Intravenous Once Bufford ButtnerUpton, Elizabeth, MD      . acetaminophen (TYLENOL) tablet 650 mg  650 mg Oral Q6H PRN Tarry KosXu, Naiping M, MD   650 mg at 01/23/17 [id number]   Or  . acetaminophen (TYLENOL) suppository 650 mg  650 mg Rectal Q6H PRN Tarry KosXu, Naiping M, MD      . alum & mag hydroxide-simeth (MAALOX/MYLANTA) 200-200-20 MG/5ML suspension 30 mL  30 mL Oral Q4H PRN Tarry KosXu, Naiping M, MD      .  calcitRIOL (ROCALTROL) capsule 1 mcg  1 mcg Oral Q T,Th,Sa-HD [name], NP   1 mcg at 01/21/17 1625  . cinacalcet (SENSIPAR) tablet 60 mg  60 mg Oral Q T,Th,Sa-HD [name], NP      . Darbepoetin Alfa (ARANESP) injection 200 mcg  200 mcg Intravenous Q Sat-HD [name], NP   200 mcg at 01/21/17 1732  . diphenhydrAMINE (BENADRYL) capsule 25 mg  25 mg Oral QHS PRN Gwenyth Bender, NP      . docusate sodium (COLACE) capsule 100 mg  100 mg Oral BID Toya Smothers M, NP   100 mg at 01/23/17 0919  . feeding supplement  (NEPRO CARB STEADY) liquid 237 mL  237 mL Oral Q1500 Rama, Maryruth Bun, MD      . ferric gluconate (NULECIT) 125 mg in sodium chloride 0.9 % 100 mL IVPB  125 mg Intravenous Q T,Th,Sa-HD [name], NP   Stopped at 01/21/17 1831  . folic acid (FOLVITE) tablet 1 mg  1 mg Oral BID Gwenyth Bender, NP   1 mg at 01/23/17 0919  . heparin injection 5,000 Units  5,000 Units Subcutaneous Q8H Bufford Buttner, MD   5,000 Units at 01/23/17 1006  . insulin aspart (novoLOG) injection 0-5 Units  0-5 Units Subcutaneous QHS Black, Karen M, NP      . insulin aspart (novoLOG) injection 0-9 Units  0-9 Units Subcutaneous TID WC Gwenyth Bender, NP   1 Units at 01/22/17 1245  . menthol-cetylpyridinium (CEPACOL) lozenge 3 mg  1 lozenge Oral PRN Tarry Kos, MD       Or  . phenol (CHLORASEPTIC) mouth spray 1 spray  1 spray Mouth/Throat PRN Tarry Kos, MD      . methocarbamol (ROBAXIN) tablet 500 mg  500 mg Oral Q6H PRN Tarry Kos, MD   500 mg at 01/23/17 1158   Or  . methocarbamol (ROBAXIN) 500 mg in dextrose 5 % 50 mL IVPB  500 mg Intravenous Q6H PRN Tarry Kos, MD      . metoCLOPramide (REGLAN) tablet 5 mg  5 mg Oral Q8H PRN Tarry Kos, MD       Or  . metoCLOPramide (REGLAN) injection 5 mg  5 mg Intravenous Q8H PRN Tarry Kos, MD      . mometasone-formoterol [facility]) 200-5 MCG/ACT inhaler 2 puff  2 puff Inhalation BID Gwenyth Bender, NP   2 puff at 01/23/17 [phone]  . morphine 4 MG/ML injection 0.52 mg  0.52 mg Intravenous Q2H PRN Tarry Kos, MD      . ondansetron [facility]) tablet 4 mg  4 mg Oral Q6H PRN Tarry Kos, MD       Or  . ondansetron [facility]) injection 4 mg  4 mg Intravenous Q6H PRN Tarry Kos, MD      . oxyCODONE (Oxy IR/ROXICODONE) immediate release tablet 10-15 mg  10-15 mg Oral Q4H PRN Rama, Maryruth Bun, MD      . sevelamer carbonate (RENVELA) tablet 800 mg  800 mg Oral TID WC Gwenyth Bender, NP   800 mg at 01/23/17 1158  . traZODone (DESYREL) tablet 50 mg  50 mg Oral QHS  Gwenyth Bender, NP   50 mg at 01/22/17 2123  . zolpidem (AMBIEN) tablet 5 mg  5 mg Oral QHS PRN Gwenyth Bender, NP   5 mg at 01/22/17 9604     Discharge Medications: Please see discharge  summary for a list of discharge medications.  Relevant Imaging Results:  Relevant Lab Results:   Additional Information YN:[id number]; dialysis T, Thurs, Saturday  Tresa MoorePatricia V Zebadiah Willert, LCSW

## 2017-01-23 NOTE — Progress Notes (Signed)
Progress Note    Lindsey Sutton  ZOX:[id number] DOB: 04-27-1946  DOA: 01/19/2017 PCP: [name], NP    Brief Narrative:   Chief complaint: Follow-up left hip fracture  Medical records reviewed and are as summarized below:  Lindsey Sutton is an 71 y.o. female with a PMH of ESRD on HD, atrial fibrillation status post pacemaker, chronic iron deficiency anemia, diabetes, and anxiety who was admitted 01/19/17 with chief complaint of left hip pain after suffering from a mechanical fall resulting in a hip fracture.  Assessment/Plan:   Principal Problem:   Hip fx (HCC)/Nondisplaced intertrochanteric fracture of the left femur Underwent intramedullary implant 01/20/17 by Dr. Roda Shutters after being medically optimized. Continue Norco for pain control. PT/OT recommending SNF placement. The patient has picked a place near [place] that she would like to go to, Awaiting confirmation of bed. Medically stable for discharge.  Active Problems:   Thrombocytopenia/Pancytopenia May be related to her fatty liver disease, platelet count stable: 84 today. WBC slowly trending up. Remains anemic. Continue to monitor periodically.    Hypertension Hypotensive yesterday, antihypertensives on hold.    Hyponatremia Mild, monitoring. Likely secondary to cirrhosis physiology given fatty liver disease.    Atrial fibrillation status post Pacemaker CHADS2Vasc = 4. Not on chronic anticoagulation. 2-D echo showed an EF of 55-60 percent, no regional wall motion abnormalities. Diastolic function could not be evaluated.    Iron deficiency anemia/acute blood loss anemia 2 g drop in hemoglobin from admission values, likely from operative blood loss. Received ESA/iron with dialysis 01/21/17. Continue  Nulecit and folic acid. Continue to monitor: 7.8 mg/dL today. Consider transfusion for persistent hypotension.    Diabetes mellitus with renal complications Hemoglobin A1c 5.4%. Glipizide on hold.Currently being managed with  insulin sensitive SSI Q AC/HS. CBGs 91-165.    History of Systolic heart failure (HCC) Cardiomegaly on chest x-ray. No evidence of systolic dysfunction based on echo.    ESRD (end stage renal disease) (HCC)/secondary hyperparathyroidism/hyperphosphatemia Hemodialysis was done 01/20/17. Nephrology following. HD every Saturday, Tuesday and Thursday. Continue Sensipar, calcitriol and Renvela.    Ascites/fatty liver disease History of paracentesis in the past. Right upper quadrant ultrasound showed a fatty liver.Spoke with patient about weight loss.   Obesity (BMI 30-39.9) Body mass index is 33.84 kg/m. Encouraged weight loss.  COPD Continue Dulera.   Moderate to severe aortic stenosis Noted on 2 D echo.    Family Communication/Anticipated D/C date and plan/Code Status   DVT prophylaxis: Lovenox ordered. Code Status: Full Code.  Family Communication: Multiple family updated at the bedside 01/20/17, none present today. Disposition Plan: Stable for SNF, awaiting bed.   Medical Consultants:    Orthopedic Surgery  Nephrology   Procedures:   2 D Echo 01/21/17:   Impressions:  - LVEF 55-60, severe LVH, moderate to severe aortic stenosis,   mildly dilated ascending aorta to 3.9 cm, trivial MR, severe   biatrial enlargement, pacemaker wires noted, moderate TR, RVSP 61   mmHg, dilated IVC.  Anti-Infectives:    None  Subjective:   Reports left hip pain, worse with sitting up in chair, inadequate pain relief with current medications.No nausea or vomiting.  Objective:    Vitals:   01/22/17 2044 01/22/17 2054 01/23/17 0107 01/23/17 0549  BP: (!) 83/49  119/64 116/72  Pulse: 63  71 63  Resp: 16  18 18   Temp: 98 F (36.7 C)   97.9 F (36.6 C)  TempSrc: Oral   Oral  SpO2: 99% 100%  100% 100%  Weight:      Height:        Intake/Output Summary (Last 24 hours) at 01/23/17 0758 Last data filed at 01/22/17 1936  Gross per 24 hour  Intake              540 ml  Output                 0 ml  Net              540 ml   Filed Weights   01/20/17 1135 01/21/17 1515 01/21/17 1851  Weight: 98 kg (216 lb 0.8 oz) 100.2 kg (220 lb 14.4 oz) 98 kg (216 lb 0.8 oz)    Exam: General exam: Uncomfortable appearing, sitting up in chair. Respiratory system: Clear to auscultation, decreased breath sounds. Respiratory effort normal. Cardiovascular system: HSIR. No JVD,  rubs, gallops or clicks. II/VI murmur. Gastrointestinal system: Abdomen is softly ndistended, nontender. No organomegaly or masses felt. Normal bowel sounds heard. Central nervous system: Drowsy. No focal neurological deficits. Extremities: No clubbing, or cyanosis. Left wrist AVG with +bruit. Left lateral thigh dressing CDI with some local swelling. Skin: No rashes, lesions or ulcers. Psychiatry: Judgement and insight appear normal. Mood & affect flat.   Data Reviewed:   I have personally reviewed following labs and imaging studies:  Labs: Basic Metabolic Panel:  Recent Labs Lab 01/19/17 1147 01/20/17 0608 01/20/17 [id number] 01/20/17 2104 01/21/17 0805 01/22/17 0311 01/23/17 0301  NA 131* 129*  --   --  130* 131* 131*  K 4.1 4.4  --   --  4.0 3.8 3.8  CL 94* 93*  --   --  94* 91* 92*  CO2 18* 20*  --   --  20* 24 24  GLUCOSE 115* 101*  --   --  156* 162* 103*  BUN 65* 71*  --   --  50* 38* 47*  CREATININE 6.64* 7.25*  --  5.19* 5.35* 4.73* 5.73*  CALCIUM 8.9 8.8*  --   --  8.2* 8.5* 8.6*  PHOS  --   --  9.7*  --   --  7.2* 7.8*   GFR Estimated Creatinine Clearance: 11 mL/min (A) (by C-G formula based on SCr of 5.73 mg/dL (H)). Liver Function Tests:  Recent Labs Lab 01/19/17 1630 01/20/17 0613 01/22/17 0311 01/23/17 0301  AST 15  --   --   --   ALT 8*  --   --   --   ALKPHOS 65  --   --   --   BILITOT 1.3*  --   --   --   PROT 7.0  --   --   --   ALBUMIN 3.7 3.4* 3.3* 3.4*   No results for input(s): LIPASE, AMYLASE in the last 168 hours. No results for input(s): AMMONIA in the last 168  hours. Coagulation profile  Recent Labs Lab 01/19/17 1147  INR 1.35    CBC:  Recent Labs Lab 01/19/17 1147 01/20/17 0608 01/20/17 2104 01/21/17 0805 01/22/17 0311 01/23/17 0301  WBC 3.6* 4.0 4.6 2.8* 3.7* 3.9*  NEUTROABS 2.7  --   --   --   --   --   HGB 10.2* 10.2* 9.6* 8.3* 8.2* 7.8*  HCT 32.4* 32.5* 31.0* 27.0* 26.6* 25.6*  MCV 89.3 89.5 90.6 90.0 91.7 91.4  PLT 102* 85* 87* 75* 88* 84*   Cardiac Enzymes: No results for input(s): CKTOTAL, CKMB, CKMBINDEX, TROPONINI in the  last 168 hours. BNP (last 3 results) No results for input(s): PROBNP in the last 8760 hours. CBG:  Recent Labs Lab 01/22/17 0629 01/22/17 1145 01/22/17 1652 01/22/17 2044 01/23/17 0552  GLUCAP 165* 128* 91 103* 104*   D-Dimer: No results for input(s): DDIMER in the last 72 hours. Hgb A1c: No results for input(s): HGBA1C in the last 72 hours.  Microbiology Recent Results (from the past 240 hour(s))  Surgical pcr screen     Status: None   Collection Time: 01/19/17  5:10 PM  Result Value Ref Range Status   MRSA, PCR NEGATIVE NEGATIVE Final   Staphylococcus aureus NEGATIVE NEGATIVE Final    Comment:        The Xpert SA Assay (FDA approved for NASAL specimens in patients over 28 years of age), is one component of a comprehensive surveillance program.  Test performance has been validated by [facility] for patients greater than or equal to 76 year old. It is not intended to diagnose infection nor to guide or monitor treatment.     Radiology: No results found.  Medications:   . calcitRIOL  1 mcg Oral Q T,Th,Sa-HD  . cinacalcet  60 mg Oral Q T,Th,Sa-HD  . darbepoetin (ARANESP) injection - DIALYSIS  200 mcg Intravenous Q Sat-HD  . docusate sodium  100 mg Oral BID  . enoxaparin (LOVENOX) injection  30 mg Subcutaneous Q24H  . feeding supplement (NEPRO CARB STEADY)  237 mL Oral Q1500  . folic acid  1 mg Oral BID  . insulin aspart  0-5 Units Subcutaneous QHS  . insulin aspart   0-9 Units Subcutaneous TID WC  . mometasone-formoterol  2 puff Inhalation BID  . sevelamer carbonate  800 mg Oral TID WC  . traZODone  50 mg Oral QHS   Continuous Infusions: . sodium chloride 10 mL/hr at 01/21/17 0833  . ferric gluconate (FERRLECIT/NULECIT) IV Stopped (01/21/17 1831)  . methocarbamol (ROBAXIN)  IV      Medical decision making is of moderate complexity and this patient is at high risk of deterioration, therefore this is a level 3 visit.  (3 problem points, 2 data points, moderate risk)   LOS: 4 days   RAMA,CHRISTINA  [facility] Pager [phone]. If unable to reach me by pager, please call my cell phone at [phone].  *Please refer to [email or web address], password [email or web address] to get updated schedule on who will round on this patient, as hospitalists switch teams weekly. If 7PM-7AM, please contact night-coverage at [email or web address], password [email or web address] for any overnight needs.  01/23/2017, 7:58 AM    .Wetzel Bjornstad

## 2017-01-24 DIAGNOSIS — I350 Nonrheumatic aortic (valve) stenosis: Secondary | ICD-10-CM

## 2017-01-24 DIAGNOSIS — S72002D Fracture of unspecified part of neck of left femur, subsequent encounter for closed fracture with routine healing: Secondary | ICD-10-CM

## 2017-01-24 DIAGNOSIS — J811 Chronic pulmonary edema: Secondary | ICD-10-CM

## 2017-01-24 LAB — RENAL FUNCTION PANEL
ALBUMIN: 3.4 g/dL — AB (ref 3.5–5.0)
Anion gap: 16 — ABNORMAL HIGH (ref 5–15)
BUN: 58 mg/dL — AB (ref 6–20)
CALCIUM: 9 mg/dL (ref 8.9–10.3)
CO2: 22 mmol/L (ref 22–32)
CREATININE: 6.55 mg/dL — AB (ref 0.44–1.00)
Chloride: 90 mmol/L — ABNORMAL LOW (ref 101–111)
GFR calc Af Amer: 7 mL/min — ABNORMAL LOW (ref >60)
GFR calc non Af Amer: 6 mL/min — ABNORMAL LOW (ref >60)
GLUCOSE: 122 mg/dL — AB (ref 65–99)
PHOSPHORUS: 8.3 mg/dL — AB (ref 2.5–4.6)
Potassium: 4.2 mmol/L (ref 3.5–5.1)
SODIUM: 128 mmol/L — AB (ref 135–145)

## 2017-01-24 LAB — CBC
HCT: 26 % — ABNORMAL LOW (ref 36.0–46.0)
Hemoglobin: 8.3 g/dL — ABNORMAL LOW (ref 12.0–15.0)
MCH: 28.8 pg (ref 26.0–34.0)
MCHC: 31.9 g/dL (ref 30.0–36.0)
MCV: 90.3 fL (ref 78.0–100.0)
PLATELETS: 96 10*3/uL — AB (ref 150–400)
RBC: 2.88 MIL/uL — ABNORMAL LOW (ref 3.87–5.11)
RDW: 17.2 % — ABNORMAL HIGH (ref 11.5–15.5)
WBC: 3.3 10*3/uL — ABNORMAL LOW (ref 4.0–10.5)

## 2017-01-24 LAB — GLUCOSE, CAPILLARY
GLUCOSE-CAPILLARY: 87 mg/dL (ref 65–99)
GLUCOSE-CAPILLARY: 127 mg/dL — AB (ref 65–99)
Glucose-Capillary: 121 mg/dL — ABNORMAL HIGH (ref 65–99)

## 2017-01-24 MED ORDER — PENTAFLUOROPROP-TETRAFLUOROETH EX AERO: 1.0000 "application " | INHALATION_SPRAY | CUTANEOUS | Status: DC | PRN

## 2017-01-24 MED ORDER — OXYCODONE HCL 5 MG PO TABS
ORAL_TABLET | ORAL | Status: AC
  Administered 2017-01-24: 15 mg via ORAL
  Filled 2017-01-24: qty 3

## 2017-01-24 MED ORDER — SODIUM CHLORIDE 0.9 % IV SOLN: 125.0000 mg | INTRAVENOUS | Status: DC

## 2017-01-24 MED ORDER — LIDOCAINE HCL (PF) 1 % IJ SOLN: 5.0000 mL | INTRAMUSCULAR | Status: DC | PRN

## 2017-01-24 MED ORDER — HEPARIN SODIUM (PORCINE) 1000 UNIT/ML DIALYSIS: 1000.0000 [IU] | INTRAMUSCULAR | Status: DC | PRN

## 2017-01-24 MED ORDER — ALTEPLASE 2 MG IJ SOLR: 2.0000 mg | Freq: Once | INTRAMUSCULAR | Status: DC | PRN

## 2017-01-24 MED ORDER — ACETAMINOPHEN 325 MG PO TABS
ORAL_TABLET | ORAL | Status: AC
  Filled 2017-01-24: qty 2

## 2017-01-24 MED ORDER — CALCITRIOL 0.5 MCG PO CAPS
ORAL_CAPSULE | ORAL | Status: AC
  Administered 2017-01-24: 1 ug via ORAL
  Filled 2017-01-24: qty 2

## 2017-01-24 MED ORDER — SODIUM CHLORIDE 0.9 % IV SOLN: 100.0000 mL | INTRAVENOUS | Status: DC | PRN

## 2017-01-24 NOTE — Progress Notes (Signed)
[facility]                                                                              Patient Demographics  Lindsey Sutton, is a 71 y.o. female, DOB - 07/08/1946, AOZ:[id number]  Admit date - 01/19/2017   Admitting Physician Lindsey Rocksavid J Merrell, MD  Outpatient Primary MD for the patient is Lindsey Sutton  Outpatient specialists:   LOS - 5  days    Chief Complaint  Patient presents with  . Fall       Brief summary   Lindsey Sutton is an 71 y.o. female with a PMH of ESRD on HD, atrial fibrillation status post pacemaker, chronic iron deficiency anemia, diabetes, and anxiety who was admitted 01/19/17 with chief complaint of left hip pain after suffering from a mechanical fall resulting in a hip fracture.   Assessment & Plan   Principal Problem:   Hip fx (HCC)/Nondisplaced intertrochanteric fracture of the left femur - Orthopedics was consulted, Patient underwent intramedullary implant 01/20/17 by Dr. Roda ShuttersXu after being medically optimized.  - Continue Norco for pain control.  - PT/OT recommending SNF placement.  - Hemodialysis planned today, likely DC to skilled nursing facility in a.m.   Active Problems:   Thrombocytopenia/Pancytopenia - Platelet count improving 96K today, WBC trending up. Remains anemic. Continue to monitor periodically.    Hypertension - Currently BP improving, antihypertensives has been held    Hyponatremia Mild, monitoring. Likely secondary to cirrhosis physiology given fatty liver disease.    Atrial fibrillation status post Pacemaker - CHADS2Vasc = 4. Not on chronic anticoagulation.  - 2-D echo showed an EF of 55-60 percent, no regional wall motion abnormalities. Diastolic function could not be evaluated.    Iron deficiency anemia/acute blood loss anemia - 2 g drop in hemoglobin from admission values, likely from operative blood loss. -  Received ESA/iron with dialysis 01/21/17. Continue  Nulecit and folic acid.  - Currently  H&H stable    Diabetes mellitus with renal complications - Hemoglobin A1c 5.4%. Glipizide on hold. - Currently being managed with insulin sensitive SSI Q AC/HS.     History of Systolic heart failure (HCC) - Cardiomegaly on chest x-ray. No evidence of systolic dysfunction based on echo.    ESRD (end stage renal disease) (HCC)/secondary hyperparathyroidism/hyperphosphatemia - Nephrology following. HD every Saturday, Tuesday and Thursday. Continue Sensipar, calcitriol and Renvela. - Per nephrology, patient is not discharging to skilled nursing facility near her home dialysis unit hence will dialyze again tomorrow a.m. and then discharged after that    Ascites/fatty liver disease - History of paracentesis in the past. Right upper quadrant ultrasound showed a fatty liver.Spoke with patient about weight loss.   Obesity (BMI 30-39.9) -Body mass index is 33.84 kg/m. Encouraged weight loss.  COPD -Stable, no wheezing Continue Dulera.   Moderate to severe aortic stenosis - Noted on 2 D echo.    Code Status: Full code DVT Prophylaxis:  Lovenox  Family Communication: Discussed in detail with the patient, all imaging results, lab results explained to the patient    Disposition Plan: Likely tomorrow skilled nursing Saturday after dialysis  Time Spent  in minutes   25 minutes  Procedures:  Hemodialysis  2 D Echo 01/21/17:   Impressions:  - LVEF 55-60, severe LVH, moderate to severe aortic stenosis, mildly dilated ascending aorta to 3.9 cm, trivial MR, severe biatrial enlargement, pacemaker wires noted, moderate TR, RVSP 61 mmHg, dilated IVC.  Consultants:   Orthopedics Surgery  Antimicrobials:   None   Medications  Scheduled Meds: . calcitRIOL  1 mcg Oral Q T,Th,Sa-HD  . cinacalcet  60 mg Oral Q T,Th,Sa-HD  . darbepoetin (ARANESP) injection - DIALYSIS  200 mcg Intravenous Q Sat-HD  . docusate sodium  100 mg Oral BID  . feeding supplement (NEPRO CARB STEADY)   237 mL Oral Q1500  . folic acid  1 mg Oral BID  . heparin subcutaneous  5,000 Units Subcutaneous Q8H  . insulin aspart  0-5 Units Subcutaneous QHS  . insulin aspart  0-9 Units Subcutaneous TID WC  . mometasone-formoterol  2 puff Inhalation BID  . sevelamer carbonate  800 mg Oral TID WC  . traZODone  50 mg Oral QHS   Continuous Infusions: . sodium chloride 10 mL/hr at 01/21/17 0833  . [START ON 01/26/2017] ferric gluconate (FERRLECIT/NULECIT) IV    . methocarbamol (ROBAXIN)  IV     PRN Meds:.acetaminophen **OR** acetaminophen, alum & mag hydroxide-simeth, diphenhydrAMINE, menthol-cetylpyridinium **OR** phenol, methocarbamol **OR** methocarbamol (ROBAXIN)  IV, metoCLOPramide **OR** metoCLOPramide (REGLAN) injection, morphine injection, ondansetron **OR** ondansetron (ZOFRAN) IV, oxyCODONE, zolpidem   Antibiotics   Anti-infectives    Start     Dose/Rate Route Frequency Ordered Stop   01/20/17 2130  clindamycin (CLEOCIN) IVPB 600 mg     600 mg 100 mL/hr over 30 Minutes Intravenous Every 6 hours 01/20/17 1810 01/21/17 0313   01/20/17 0700  clindamycin (CLEOCIN) IVPB 900 mg  Status:  Discontinued     900 mg 100 mL/hr over 30 Minutes Intravenous On call to O.R. 01/20/17 0650 01/20/17 0653   01/19/17 1630  clindamycin (CLEOCIN) IVPB 900 mg     900 mg 100 mL/hr over 30 Minutes Intravenous To ShortStay Surgical 01/19/17 1616 01/20/17 1600        Subjective:   Lindsey Sutton was seen and examined today. Stable, left hip pain control. No abdominal pain nausea vomiting. Patient denies dizziness, chest pain, shortness of breath, new weakness, numbess, tingling. No acute events overnight.    Objective:   Vitals:   01/24/17 1220 01/24/17 1225 01/24/17 1230 01/24/17 1245  BP: 131/67 122/70 123/62 (!) 116/57  Pulse: 68 66 63 64  Resp: 16     Temp: 98 F (36.7 C)     TempSrc: Oral     SpO2: 99%     Weight: 99.3 kg (218 lb 14.7 oz)     Height:        Intake/Output Summary (Last 24  hours) at 01/24/17 1250 Last data filed at 01/23/17 1835  Gross per 24 hour  Intake              419 ml  Output              100 ml  Net              319 ml     Wt Readings from Last 3 Encounters:  01/24/17 99.3 kg (218 lb 14.7 oz)     Exam  General: Alert and oriented x 3, NAD  HEENT:    Neck: Supple, no JVD, no masses  Cardiovascular: S1 S2 auscultated, 2/6 ,murmurs. Regular  rate and rhythm.  Respiratory: Clear to auscultation bilaterally, no wheezing, rales or rhonchi  Gastrointestinal: Soft, nontender, nondistended, + bowel sounds  Ext: no cyanosis clubbing or edema  Neuro: AAOx3, Cr N's II- XII. Strength 5/5 upper and lower extremities bilaterally  Skin: No rashes  Psych: Normal affect and demeanor, alert and oriented x3    Data Reviewed:  I have personally reviewed following labs and imaging studies  Micro Results Recent Results (from the past 240 hour(s))  Surgical pcr screen     Status: None   Collection Time: 01/19/17  5:10 PM  Result Value Ref Range Status   MRSA, PCR NEGATIVE NEGATIVE Final   Staphylococcus aureus NEGATIVE NEGATIVE Final    Comment:        The Xpert SA Assay (FDA approved for NASAL specimens in patients over 13 years of age), is one component of a comprehensive surveillance program.  Test performance has been validated by [facility] for patients greater than or equal to 38 year old. It is not intended to diagnose infection nor to guide or monitor treatment.     Radiology Reports Dg Chest 1 View  Result Date: 01/19/2017 CLINICAL DATA:  Left femur fracture.  Diabetes. EXAM: CHEST 1 VIEW COMPARISON:  None. FINDINGS: Single lead pacemaker in the region of the right ventricle. Cardiomegaly. Aortic atherosclerosis. Pulmonary venous hypertension without edema. No infiltrate, collapse or effusion. No acute bone finding. IMPRESSION: Single lead pacemaker. Cardiomegaly. Aortic atherosclerosis. Pulmonary venous hypertension.  Electronically Signed   By: Paulina Fusi M.D.   On: 01/19/2017 13:38   US Abdomen Complete  Result Date: 01/20/2017 CLINICAL DATA:  Possible liver disease abdominal distension EXAM: ABDOMEN ULTRASOUND COMPLETE COMPARISON:  None. FINDINGS: Gallbladder: Multiple shadowing stones, measuring up to 1.2 cm. Slightly thickened wall at 3.7 mm. Negative sonographic Murphy's. Small amount of free fluid in the right upper quadrant. Common bile duct: Diameter: 3.5 mm Liver: Probably enlarged. Increased echogenicity. No focal hepatic abnormality. IVC: No abnormality visualized. Pancreas: Largely obscured by bowel gas. Spleen: Size and appearance within normal limits. Probable accessory splenule. Right Kidney: Length: 9.3 cm. Increased cortical echogenicity. Atrophic. No hydronephrosis. Left Kidney: Length: 8.9 cm. Increased cortical echogenicity. Atrophic. No hydronephrosis. Cyst in the midpole measuring 4.1 x 2.7 x 4 cm Abdominal aorta: Obscured by bowel gas Other findings: Small amount of ascites in the upper abdomen IMPRESSION: 1. Gallstones with slightly thickened wall which is a nonspecific finding. Negative sonographic Murphy's. No biliary dilatation. 2. Enlarged appearing echogenic liver suggesting fatty infiltration 3. Small amount of ascites in the upper abdomen 4. Echogenic atrophic kidneys with no hydronephrosis. 4.1 cm cyst in the left kidney. Electronically Signed   By: Jasmine Pang M.D.   On: 01/20/2017 01:55   Ct Hip Left Wo Contrast  Result Date: 01/19/2017 CLINICAL DATA:  Status post trip and fall today with a left hip fracture. Initial encounter. EXAM: CT OF THE LEFT HIP WITHOUT CONTRAST TECHNIQUE: Multidetector CT imaging of the left hip was performed according to the standard protocol. Multiplanar CT image reconstructions were also generated. COMPARISON:  Plain films left hip earlier today. FINDINGS: Bones/Joint/Cartilage As seen on the comparison plain films, the patient has a nondisplaced fracture of  the left hip extending from the base of the femoral neck superiorly along the intertrochanteric ridge to the lesser trochanter. The left hip is located. No other fracture is identified. Mild degenerative changes seen about the left hip Ligaments Suboptimally assessed by CT. Muscles and Tendons Intact. Soft tissues  There is a large volume of pelvic ascites. Extensive iliac atherosclerosis is identified. Calcified uterine fibroids are noted. IMPRESSION: Acute nondisplaced left hip fracture extends from the base the femoral neck superiorly along the trochanteric region of the lesser trochanter. No other acute bony abnormality. Large volume of pelvic ascites. Extensive atherosclerosis. Electronically Signed   By: Drusilla Kanner M.D.   On: 01/19/2017 15:48   Dg C-arm 1-60 Min  Result Date: 01/20/2017 CLINICAL DATA:  Left intramedullary nail. EXAM: OPERATIVE LEFT HIP (WITH PELVIS IF PERFORMED) 2 VIEWS TECHNIQUE: Fluoroscopic spot image(s) were submitted for interpretation post-operatively. COMPARISON:  Radiography from yesterday FINDINGS: Fluoroscopy showing proximal femur fracture reduction and internal fixation. No unexpected finding. IMPRESSION: Fluoroscopy for left femur fracture ORIF.  No unexpected finding. Electronically Signed   By: Marnee Spring M.D.   On: 01/20/2017 16:40   Dg Hip Operative Unilat W Or W/o Pelvis Left  Result Date: 01/20/2017 CLINICAL DATA:  Left intramedullary nail. EXAM: OPERATIVE LEFT HIP (WITH PELVIS IF PERFORMED) 2 VIEWS TECHNIQUE: Fluoroscopic spot image(s) were submitted for interpretation post-operatively. COMPARISON:  Radiography from yesterday FINDINGS: Fluoroscopy showing proximal femur fracture reduction and internal fixation. No unexpected finding. IMPRESSION: Fluoroscopy for left femur fracture ORIF.  No unexpected finding. Electronically Signed   By: Marnee Spring M.D.   On: 01/20/2017 16:40   Dg Hip Unilat With Pelvis 2-3 Views Left  Result Date:  01/19/2017 CLINICAL DATA:  Fall.  Left hip pain. EXAM: DG HIP (WITH OR WITHOUT PELVIS) 2-3V LEFT COMPARISON:  No recent prior . FINDINGS: Diffuse osteopenia degenerative change. Fracture of the base of the left femoral neck appears to be present. No prominent displacement. Aortoiliac atherosclerotic vascular calcification. IMPRESSION: 1. Nondisplaced fracture of the base of the left femoral neck appears to be present. 2. Severe osteopenia. Degenerative changes lumbar spine and both hips. 3. Aortoiliac atherosclerotic vascular disease. Electronically Signed   By: Maisie Fus  Register   On: 01/19/2017 12:49   Dg Knee 3 Views Left  Result Date: 01/19/2017 CLINICAL DATA:  Closed left hip fracture. Clinical concern for a knee fracture. EXAM: LEFT KNEE - 3 VIEW COMPARISON:  None. FINDINGS: Diffuse osteopenia. Marked medial and lateral joint space narrowing. Mild patellofemoral spur formation. Moderate-sized effusion. Anterior and medial soft tissue swelling. No fracture or dislocation visualized on limited views due to limited ability of the patient to cooperate for positioning due to the hip fracture. Dense atheromatous arterial calcifications. IMPRESSION: Limited examination demonstrating a moderate-sized effusion, tricompartmental degenerative changes and no visible fracture. If there is a continued clinical concern for the possibility of a knee fracture, a knee CT without contrast would be recommended since the patient was unable to cooperate fully for positioning on the radiographs. Electronically Signed   By: Beckie Salts M.D.   On: 01/19/2017 15:31    Lab Data:  CBC:  Recent Labs Lab 01/19/17 1147  01/20/17 2104 01/21/17 0805 01/22/17 0311 01/23/17 0301 01/24/17 0559  WBC 3.6*  < > 4.6 2.8* 3.7* 3.9* 3.3*  NEUTROABS 2.7  --   --   --   --   --   --   HGB 10.2*  < > 9.6* 8.3* 8.2* 7.8* 8.3*  HCT 32.4*  < > 31.0* 27.0* 26.6* 25.6* 26.0*  MCV 89.3  < > 90.6 90.0 91.7 91.4 90.3  PLT 102*  < > 87* 75*  88* 84* 96*  < > = values in this interval not displayed. Basic Metabolic Panel:  Recent Labs Lab 01/20/17 [phone]  01/20/17 1610 01/20/17 2104 01/21/17 0805 01/22/17 0311 01/23/17 0301 01/24/17 0559  NA 129*  --   --  130* 131* 131* 128*  K 4.4  --   --  4.0 3.8 3.8 4.2  CL 93*  --   --  94* 91* 92* 90*  CO2 20*  --   --  20* 24 24 22   GLUCOSE 101*  --   --  156* 162* 103* 122*  BUN 71*  --   --  50* 38* 47* 58*  CREATININE 7.25*  --  5.19* 5.35* 4.73* 5.73* 6.55*  CALCIUM 8.8*  --   --  8.2* 8.5* 8.6* 9.0  PHOS  --  9.7*  --   --  7.2* 7.8* 8.3*   GFR: Estimated Creatinine Clearance: 9.7 mL/min (A) (by C-G formula based on SCr of 6.55 mg/dL (H)). Liver Function Tests:  Recent Labs Lab 01/19/17 1630 01/20/17 0613 01/22/17 0311 01/23/17 0301 01/24/17 0559  AST 15  --   --   --   --   ALT 8*  --   --   --   --   ALKPHOS 65  --   --   --   --   BILITOT 1.3*  --   --   --   --   PROT 7.0  --   --   --   --   ALBUMIN 3.7 3.4* 3.3* 3.4* 3.4*   No results for input(s): LIPASE, AMYLASE in the last 168 hours. No results for input(s): AMMONIA in the last 168 hours. Coagulation Profile:  Recent Labs Lab 01/19/17 1147  INR 1.35   Cardiac Enzymes: No results for input(s): CKTOTAL, CKMB, CKMBINDEX, TROPONINI in the last 168 hours. BNP (last 3 results) No results for input(s): PROBNP in the last 8760 hours. HbA1C: No results for input(s): HGBA1C in the last 72 hours. CBG:  Recent Labs Lab 01/23/17 0552 01/23/17 1403 01/23/17 1639 01/23/17 2235 01/24/17 0749  GLUCAP 104* 115* 139* 156* 121*   Lipid Profile: No results for input(s): CHOL, HDL, LDLCALC, TRIG, CHOLHDL, LDLDIRECT in the last 72 hours. Thyroid Function Tests: No results for input(s): TSH, T4TOTAL, FREET4, T3FREE, THYROIDAB in the last 72 hours. Anemia Panel: No results for input(s): VITAMINB12, FOLATE, FERRITIN, TIBC, IRON, RETICCTPCT in the last 72 hours. Urine analysis: No results found for:  COLORURINE, APPEARANCEUR, LABSPEC, PHURINE, GLUCOSEU, HGBUR, BILIRUBINUR, KETONESUR, PROTEINUR, UROBILINOGEN, NITRITE, LEUKOCYTESUR   Ripudeep Rai M.D. [facility] 01/24/2017, 12:50 PM  Pager: [phone] Between 7am to 7pm - call Pager - [phone]  After 7pm go to [email or web address] - password [email or web address]  Call night coverage person covering after 7pm

## 2017-01-24 NOTE — Progress Notes (Signed)
PT Cancellation Note  Patient Details Name: Lindsey Sutton MRN: [id number] DOB: 06/21/1946   Cancelled Treatment:    Reason Eval/Treat Not Completed: Other (comment) (Pt remains in hemodialysis.  Will  plan for tx tomorrow.  )   [name] 01/24/2017, 3:59 PM [name], PTA pager [phone]

## 2017-01-24 NOTE — Care Management (Addendum)
Case Acupuncturistmanager assisting social worker with arranging outpatient dialysis for patient. CM contacted [facility] in [place] to confirm that they will accept patient back on her Tuesday, Thursday Friday Schedule  after she completes rehab at [facility]. CM left message for Fabio AsaSharon Barbie at [phone] to arrange transcient dialysis. CM informed Social worker that the dialysis centers are currently full. SW will work with patient family to facilitate her placement. CM did speak with Dr. Signe ColtUpton concerning patient need to have dialysis days changes to Monday, Wednesday, Friday. CM called [phone] to arrange for  Center change and date changes. They have requested that dialysis records be faxed to them at [phone].

## 2017-01-24 NOTE — Progress Notes (Signed)
Pt is in HD. Will check pt next day for OT Thanks, Lise AuerLori Michie Molnar, [place] [phone]

## 2017-01-24 NOTE — Progress Notes (Signed)
[facility] Progress Note   Subjective: Patient complaining of hip pain this morning but denies any other complaints. BP and Hgb improving.   Objective Vitals:   01/23/17 1500 01/23/17 2100 01/24/17 0449 01/24/17 0741  BP: 98/63 114/71 128/71   Pulse:  82 66   Resp: 16 15 16    Temp: 98.9 F (37.2 C) 98.1 F (36.7 C) 97.9 F (36.6 C)   TempSrc: Oral Axillary Oral   SpO2: 98%  97% 96%  Weight:      Height:       Physical Exam General: Sleeping upon entering room but easily able to arouse; pleasant, in NAD Heart: RRR, 2/6 systolic murmur appreciated Lungs: CTAB, normal WOB on RA, no wheezes Abdomen: obese, non-tender, mildly distended, +BS Extremities:No LE edema. Wound R & L 5th toes. Bruising noted L hip, dressing clean and intact Dialysis Access: LFA AVF + bruit   Additional Objective Labs: Basic Metabolic Panel:  Recent Labs Lab 01/22/17 0311 01/23/17 0301 01/24/17 0559  NA 131* 131* 128*  K 3.8 3.8 4.2  CL 91* 92* 90*  CO2 24 24 22   GLUCOSE 162* 103* 122*  BUN 38* 47* 58*  CREATININE 4.73* 5.73* 6.55*  CALCIUM 8.5* 8.6* 9.0  PHOS 7.2* 7.8* 8.3*   Liver Function Tests:  Recent Labs Lab 01/19/17 1630  01/22/17 0311 01/23/17 0301 01/24/17 0559  AST 15  --   --   --   --   ALT 8*  --   --   --   --   ALKPHOS 65  --   --   --   --   BILITOT 1.3*  --   --   --   --   PROT 7.0  --   --   --   --   ALBUMIN 3.7  < > 3.3* 3.4* 3.4*  < > = values in this interval not displayed. No results for input(s): LIPASE, AMYLASE in the last 168 hours. CBC:  Recent Labs Lab 01/19/17 1147  01/20/17 2104 01/21/17 0805 01/22/17 0311 01/23/17 0301 01/24/17 0559  WBC 3.6*  < > 4.6 2.8* 3.7* 3.9* 3.3*  NEUTROABS 2.7  --   --   --   --   --   --   HGB 10.2*  < > 9.6* 8.3* 8.2* 7.8* 8.3*  HCT 32.4*  < > 31.0* 27.0* 26.6* 25.6* 26.0*  MCV 89.3  < > 90.6 90.0 91.7 91.4 90.3  PLT 102*  < > 87* 75* 88* 84* 96*  < > = values in this interval not  displayed. Blood Culture No results found for: SDES, SPECREQUEST, CULT, REPTSTATUS  Cardiac Enzymes: No results for input(s): CKTOTAL, CKMB, CKMBINDEX, TROPONINI in the last 168 hours. CBG:  Recent Labs Lab 01/23/17 0552 01/23/17 1403 01/23/17 1639 01/23/17 2235 01/24/17 0749  GLUCAP 104* 115* 139* 156* 121*   Studies/Results: No results found. Medications: . sodium chloride 10 mL/hr at 01/21/17 0833  . ferric gluconate (FERRLECIT/NULECIT) IV Stopped (01/21/17 1831)  . methocarbamol (ROBAXIN)  IV     . calcitRIOL  1 mcg Oral Q T,Th,Sa-HD  . cinacalcet  60 mg Oral Q T,Th,Sa-HD  . darbepoetin (ARANESP) injection - DIALYSIS  200 mcg Intravenous Q Sat-HD  . docusate sodium  100 mg Oral BID  . feeding supplement (NEPRO CARB STEADY)  237 mL Oral Q1500  . folic acid  1 mg Oral BID  . heparin subcutaneous  5,000 Units Subcutaneous Q8H  . insulin  aspart  0-5 Units Subcutaneous QHS  . insulin aspart  0-9 Units Subcutaneous TID WC  . mometasone-formoterol  2 puff Inhalation BID  . sevelamer carbonate  800 mg Oral TID WC  . traZODone  50 mg Oral QHS     Dialysis Orders: [place] T,Th,S 4 hrs 180 NRe 94 kg 2.0 K/2.0 Ca 450/800 -Heparin 5000 units TIW -Senipar 30 mg PO TIW (Last PTH 1453 01/14/17) -Calcitriol 1.0 mcg PO TIW -Mircera 225 mcg IV q 2 weeks (last dose 01/10/17 HGB 10.5 01/14/17) -Venofer 50 mg IV weekly (last dose 01/14/17 Ferritin 977 Fe 42 TSat 19% 01/14/17)  Assessment/Plan: 1. L. Femoral neck fx-S/p intertrochanteric fracture with intramedullary implant per Dr. Roda ShuttersXu. PO day 4. 2. ESRD -Had HD 05/04 and 01/21/17. Tx today (05/08).  3. Anemia - Hgb improved to 8.3 (from 7.8 yesterday). Received Aranesp 200 mcg IV with HD 01/21/17. Cont Fe load. 1U pRBCs with HD today. 4. Secondary hyperparathyroidism - Cont sensipar, binders, VDRA. Phos improved to 8.3 (from 7.8 yesterday). Non-adherent to BMD meds.  5. HTN/volume - HD today. Lowest BP yesterday 98/63. Push to  goal.  May need EDW adjustment as outpatient. 6. Nutrition - Renal diet, nepro renal vits.  Tarri AbernethyAbigail J Genell Thede, MD, MPH PGY-2 [facility] Pager [phone]

## 2017-01-24 NOTE — Procedures (Signed)
Patient seen and examined on Hemodialysis. QB 400, UF goal 3L.    HD tomorrow and then d/c.  To get 1 u PRBCs today.  Treatment adjusted as needed.  Bufford ButtnerElizabeth Belky Mundo MD [facility] 1:23 PM

## 2017-01-24 NOTE — Care Management (Addendum)
Patient will have dialysis Monday, Wednesday and Friday at [facility] on [address], while she is inpatient at [facility].  Case manager provided this information to Child psychotherapistocial Worker.

## 2017-01-25 ENCOUNTER — Encounter (HOSPITAL_COMMUNITY): Payer: Self-pay | Admitting: *Deleted

## 2017-01-25 ENCOUNTER — Inpatient Hospital Stay (HOSPITAL_COMMUNITY): Payer: Medicare Other

## 2017-01-25 DIAGNOSIS — E119 Type 2 diabetes mellitus without complications: Secondary | ICD-10-CM

## 2017-01-25 LAB — URINALYSIS, ROUTINE W REFLEX MICROSCOPIC
BILIRUBIN URINE: NEGATIVE
Glucose, UA: NEGATIVE mg/dL
KETONES UR: 5 mg/dL — AB
Nitrite: NEGATIVE
Protein, ur: 100 mg/dL — AB
Specific Gravity, Urine: 1.014 (ref 1.005–1.030)
pH: 6 (ref 5.0–8.0)

## 2017-01-25 LAB — GLUCOSE, CAPILLARY
GLUCOSE-CAPILLARY: 130 mg/dL — AB (ref 65–99)
GLUCOSE-CAPILLARY: 127 mg/dL — AB (ref 65–99)
Glucose-Capillary: 132 mg/dL — ABNORMAL HIGH (ref 65–99)

## 2017-01-25 LAB — RENAL FUNCTION PANEL
Albumin: 3.6 g/dL (ref 3.5–5.0)
Anion gap: 17 — ABNORMAL HIGH (ref 5–15)
BUN: 42 mg/dL — AB (ref 6–20)
CHLORIDE: 91 mmol/L — AB (ref 101–111)
CO2: 25 mmol/L (ref 22–32)
CREATININE: 5.37 mg/dL — AB (ref 0.44–1.00)
Calcium: 9.8 mg/dL (ref 8.9–10.3)
GFR calc Af Amer: 8 mL/min — ABNORMAL LOW (ref >60)
GFR, EST NON AFRICAN AMERICAN: 7 mL/min — AB (ref >60)
Glucose, Bld: 132 mg/dL — ABNORMAL HIGH (ref 65–99)
Phosphorus: 7.1 mg/dL — ABNORMAL HIGH (ref 2.5–4.6)
Potassium: 3.8 mmol/L (ref 3.5–5.1)
Sodium: 133 mmol/L — ABNORMAL LOW (ref 135–145)

## 2017-01-25 LAB — CBC
HCT: 30 % — ABNORMAL LOW (ref 36.0–46.0)
Hemoglobin: 9.3 g/dL — ABNORMAL LOW (ref 12.0–15.0)
MCH: 28.2 pg (ref 26.0–34.0)
MCHC: 31 g/dL (ref 30.0–36.0)
MCV: 90.9 fL (ref 78.0–100.0)
PLATELETS: 124 10*3/uL — AB (ref 150–400)
RBC: 3.3 MIL/uL — ABNORMAL LOW (ref 3.87–5.11)
RDW: 17 % — AB (ref 11.5–15.5)
WBC: 4.7 10*3/uL (ref 4.0–10.5)

## 2017-01-25 LAB — LIPASE, BLOOD: Lipase: 23 U/L (ref 11–51)

## 2017-01-25 MED ORDER — POLYETHYLENE GLYCOL 3350 17 G PO PACK
17.0000 g | PACK | Freq: Two times a day (BID) | ORAL | Status: DC
  Administered 2017-01-25 – 2017-01-26 (×2): 17 g via ORAL
  Filled 2017-01-25 (×2): qty 1

## 2017-01-25 MED ORDER — OXYCODONE HCL 5 MG PO TABS: 5.0000 mg | ORAL_TABLET | Freq: Four times a day (QID) | ORAL | Status: DC | PRN

## 2017-01-25 MED ORDER — SENNOSIDES-DOCUSATE SODIUM 8.6-50 MG PO TABS
1.0000 | ORAL_TABLET | Freq: Two times a day (BID) | ORAL | Status: DC
  Administered 2017-01-25 – 2017-01-26 (×2): 1 via ORAL
  Filled 2017-01-25 (×2): qty 1

## 2017-01-25 MED ORDER — CALCITRIOL 0.5 MCG PO CAPS
ORAL_CAPSULE | ORAL | Status: AC
  Administered 2017-01-25: 1 ug via ORAL
  Filled 2017-01-25: qty 2

## 2017-01-25 MED ORDER — MORPHINE SULFATE (PF) 4 MG/ML IV SOLN: 1.0000 mg | INTRAVENOUS | Status: DC | PRN

## 2017-01-25 MED ORDER — PANTOPRAZOLE SODIUM 40 MG PO TBEC
40.0000 mg | DELAYED_RELEASE_TABLET | Freq: Every day | ORAL | Status: DC
  Administered 2017-01-26: 40 mg via ORAL
  Filled 2017-01-25: qty 1

## 2017-01-25 MED ORDER — CINACALCET HCL 30 MG PO TABS
60.0000 mg | ORAL_TABLET | ORAL | Status: DC
  Filled 2017-01-25: qty 2

## 2017-01-25 MED ORDER — NEPRO/CARBSTEADY PO LIQD: 237.0000 mL | Freq: Every day | ORAL | 0 refills | Status: DC

## 2017-01-25 MED ORDER — METHOCARBAMOL 500 MG PO TABS: 500.0000 mg | ORAL_TABLET | Freq: Four times a day (QID) | ORAL | Status: DC | PRN

## 2017-01-25 MED ORDER — PROCHLORPERAZINE EDISYLATE 5 MG/ML IJ SOLN
10.0000 mg | Freq: Once | INTRAMUSCULAR | Status: AC
  Administered 2017-01-25: 10 mg via INTRAVENOUS
  Filled 2017-01-25: qty 2

## 2017-01-25 MED ORDER — DARBEPOETIN ALFA 200 MCG/0.4ML IJ SOSY: 200.0000 ug | PREFILLED_SYRINGE | INTRAMUSCULAR | Status: DC

## 2017-01-25 MED ORDER — ACETAMINOPHEN 325 MG PO TABS: 650.0000 mg | ORAL_TABLET | Freq: Four times a day (QID) | ORAL | Status: DC | PRN

## 2017-01-25 MED ORDER — PANTOPRAZOLE SODIUM 40 MG PO TBEC: 40.0000 mg | DELAYED_RELEASE_TABLET | Freq: Every day | ORAL | Status: DC

## 2017-01-25 MED ORDER — SODIUM CHLORIDE 0.9 % IV SOLN
125.0000 mg | INTRAVENOUS | Status: AC
  Administered 2017-01-25: 125 mg via INTRAVENOUS
  Filled 2017-01-25: qty 10

## 2017-01-25 MED ORDER — DOCUSATE SODIUM 100 MG PO CAPS: 100.0000 mg | ORAL_CAPSULE | Freq: Two times a day (BID) | ORAL | 0 refills | Status: DC

## 2017-01-25 MED ORDER — SODIUM CHLORIDE 0.9 % IV SOLN: 125.0000 mg | INTRAVENOUS | Status: DC

## 2017-01-25 MED ORDER — OXYCODONE HCL 5 MG PO TABS
5.0000 mg | ORAL_TABLET | Freq: Four times a day (QID) | ORAL | Status: DC | PRN
  Administered 2017-01-25 – 2017-01-26 (×2): 5 mg via ORAL
  Filled 2017-01-25 (×2): qty 1

## 2017-01-25 MED ORDER — CALCITRIOL 0.5 MCG PO CAPS
1.0000 ug | ORAL_CAPSULE | ORAL | Status: DC
  Administered 2017-01-25: 1 ug via ORAL

## 2017-01-25 MED ORDER — ONDANSETRON HCL 4 MG PO TABS: 4.0000 mg | ORAL_TABLET | Freq: Four times a day (QID) | ORAL | 0 refills | Status: DC | PRN

## 2017-01-25 NOTE — Progress Notes (Signed)
Occupational Therapy Treatment Patient Details Name: Lindsey Sutton MRN: [id number] DOB: August 08, 1946 Today's Date: 01/25/2017    History of present illness Patient is a 71 yo female admitted 01/19/17 following fall with Lt hip fx.  Patient s/p IM nailing of fracture Lt hip.    PMH:  ESRD on HD, HTN, Afib, pacemaker, anemia, DM, anxiety, CHF   OT comments  Pt progressing towards goals. Pt performing grooming at EOB with set up and supervision while maintaining sitting balance.  Pt able to doff R sock with Min guard for safety but declined to attempt L sock due to pain. Will continue to follow acutely to facilitate safe dc and increase pt's occupational performance. Continue to recommend dc to SNF for further OT.    Follow Up Recommendations  SNF;Supervision/Assistance - 24 hour    Equipment Recommendations  Other (comment) (Defer to next venue)    Recommendations for Other Services      Precautions / Restrictions Precautions Precautions: Fall Restrictions Weight Bearing Restrictions: Yes RLE Weight Bearing: Weight bearing as tolerated LLE Weight Bearing: Weight bearing as tolerated       Mobility Bed Mobility Overal bed mobility: Needs Assistance Bed Mobility: Supine to Sit     Supine to sit: Mod assist     General bed mobility comments: Pt required assist with B LEs and elevation of trunk into a seated position.    Transfers Overall transfer level: Needs assistance Equipment used: Rolling walker (2 wheeled) Transfers: Sit to/from Stand Sit to Stand: Mod assist;+2 physical assistance (Initially mod+2, returned to seated position min +1.  )         General transfer comment: Cues for hand placement, assist to faciliate forward weight shifting and to boost patient into standing.  Pt forward flexed and required assist for extension of trunk.  Pt with improved ease of mobility during session but require a lengthy band of time to complete activities.      Balance Overall  balance assessment: Needs assistance Sitting-balance support: Single extremity supported;Feet supported Sitting balance-Leahy Scale: Fair Sitting balance - Comments: Single UE support to maintain EOB balance while performing grooming   Standing balance support: Single extremity supported;During functional activity Standing balance-Leahy Scale: Poor Standing balance comment: Able to maintain standing with single UE support                           ADL either performed or assessed with clinical judgement   ADL Overall ADL's : Needs assistance/impaired     Grooming: Brushing hair;Set up;Supervision/safety;Sitting Grooming Details (indicate cue type and reason): Pt performed grooming at EOB without A for sitting balance             Lower Body Dressing: Minimal assistance;Sit to/from stand Lower Body Dressing Details (indicate cue type and reason): Pt able to doff R sock. Declined to doff L sock due to pain             Functional mobility during ADLs: Minimal assistance;Rolling walker General ADL Comments: Pt demonstrating functional progress with need for Min A for ADLs and functional mobility     Vision       Perception     Praxis      Cognition Arousal/Alertness: Awake/alert Behavior During Therapy: [facility] for tasks assessed/performed;Anxious Overall Cognitive Status: Within Functional Limits for tasks assessed  Exercises     Shoulder Instructions       General Comments Daughter present for session    Pertinent Vitals/ Pain       Pain Assessment: Faces Pain Score: 7  Faces Pain Scale: Hurts even more Pain Location: Lt hip/leg Pain Descriptors / Indicators: Aching;Grimacing;Sore Pain Intervention(s): Monitored during session  Home Living                                          Prior Functioning/Environment              Frequency  Min 2X/week         Progress Toward Goals  OT Goals(current goals can now be found in the care plan section)  Progress towards OT goals: Progressing toward goals  Acute Rehab OT Goals Patient Stated Goal: To walk OT Goal Formulation: With patient Time For Goal Achievement: 02/05/17 Potential to Achieve Goals: Good ADL Goals Pt Will Perform Grooming: standing;with min assist Pt Will Perform Lower Body Dressing: with min assist;sit to/from stand Pt Will Transfer to Toilet: with min guard assist;bedside commode;ambulating Pt Will Perform Toileting - Clothing Manipulation and hygiene: with min guard assist;sit to/from stand  Plan Discharge plan remains appropriate    Co-evaluation    PT/OT/SLP Co-Evaluation/Treatment: Yes Reason for Co-Treatment: Necessary to address cognition/behavior during functional activity;To address functional/ADL transfers PT goals addressed during session: Mobility/safety with mobility OT goals addressed during session: ADL's and self-care      AM-PAC PT "6 Clicks" Daily Activity     Outcome Measure   Help from another person eating meals?: A Little Help from another person taking care of personal grooming?: A Little Help from another person toileting, which includes using toliet, bedpan, or urinal?: A Lot Help from another person bathing (including washing, rinsing, drying)?: A Lot Help from another person to put on and taking off regular upper body clothing?: A Lot Help from another person to put on and taking off regular lower body clothing?: A Lot 6 Click Score: 14    End of Session Equipment Utilized During Treatment: Oxygen;Gait belt;Rolling walker  OT Visit Diagnosis: Unsteadiness on feet (R26.81)   Activity Tolerance Patient limited by pain   Patient Left in chair;with call bell/phone within reach;with family/visitor present;with nursing/sitter in room   Nurse Communication Mobility status;Other (comment) (nausea)        Time: [id number] OT Time  Calculation (min): 34 min  Charges: OT General Charges $OT Visit: 1 Procedure OT Treatments $Self Care/Home Management : 8-22 mins  Lindsey Sutton, OTR/L [phone]   Lindsey Sutton 01/25/2017, 5:12 PM

## 2017-01-25 NOTE — Progress Notes (Addendum)
Physical Therapy Treatment Patient Details Name: Lindsey Sutton MRN: [id number] DOB: 1946/06/15 Today's Date: 01/25/2017    History of Present Illness Patient is a 71 yo female admitted 01/19/17 following fall with Lt hip fx.  Patient s/p IM nailing of fracture Lt hip.    PMH:  ESRD on HD, HTN, Afib, pacemaker, anemia, DM, anxiety, CHF    PT Comments    Pt's daughter came into hall requesting her mother needed to urinate.  PTA re-entered room to assist patient with toilet transfer.  Pt with cloudy foul-smelling urine.  Informed nursing.     Follow Up Recommendations  SNF;Supervision/Assistance - 24 hour     Equipment Recommendations  Wheelchair (measurements PT);Wheelchair cushion (measurements PT)    Recommendations for Other Services       Precautions / Restrictions Precautions Precautions: Fall Restrictions Weight Bearing Restrictions: Yes LLE Weight Bearing: Weight bearing as tolerated    Mobility  Bed Mobility            General bed mobility comments: Sitting in recliner requesting to urinate.  Daughter came out and found PTA and PTA re-entered room for a toilet transfer.    Transfers Overall transfer level: Needs assistance Equipment used: Rolling walker (2 wheeled) Transfers: Sit to/from Stand Sit to Stand: Mod assist         General transfer comment: Cues for hand placement.  Cues for eccentric loading.  Pt remains slow to ascend and anxious.    Ambulation/Gait Ambulation/Gait assistance: Mod assist Ambulation Distance (Feet):  (steps to [facility] from chair.  4 ft x2 trials.  ) Assistive device: Rolling walker (2 wheeled) Gait Pattern/deviations: Step-to pattern;Shuffle;Decreased stride length;Trunk flexed;Antalgic     General Gait Details: Cues for sequencing, RW safety, upper trunk control and advancing steps toward BSC.     Stairs            Wheelchair Mobility    Modified Rankin (Stroke Patients Only)       Balance Overall balance  assessment: Needs assistance   Sitting balance-Leahy Scale: Fair Sitting balance - Comments: UE support to maintain balance.     Standing balance-Leahy Scale: Poor                              Cognition Arousal/Alertness: Awake/alert Behavior During Therapy: WFL for tasks assessed/performed;Anxious Overall Cognitive Status: Within Functional Limits for tasks assessed                                        Exercises      General Comments        Pertinent Vitals/Pain Pain Assessment: 0-10 Pain Score: 7  Pain Location: Lt hip/leg Pain Descriptors / Indicators: Aching;Grimacing;Sore Pain Intervention(s): Monitored during session;Repositioned    Home Living                      Prior Function            PT Goals (current goals can now be found in the care plan section) Acute Rehab PT Goals Patient Stated Goal: To walk Potential to Achieve Goals: Good Progress towards PT goals: Progressing toward goals    Frequency    Min 4X/week      PT Plan Current plan remains appropriate    Co-evaluation      AM-PAC PT "6 Clicks" Daily  Activity  Outcome Measure  Difficulty turning over in bed (including adjusting bedclothes, sheets and blankets)?: Total Difficulty moving from lying on back to sitting on the side of the bed? : Total Difficulty sitting down on and standing up from a chair with arms (e.g., wheelchair, bedside commode, etc,.)?: Total Help needed moving to and from a bed to chair (including a wheelchair)?: A Lot Help needed walking in hospital room?: A Lot Help needed climbing 3-5 steps with a railing? : A Lot 6 Click Score: 9    End of Session Equipment Utilized During Treatment: Gait belt Activity Tolerance: Patient limited by pain;Patient limited by fatigue Patient left: with call bell/phone within reach;with family/visitor present;in chair Nurse Communication: Mobility status (informed nursing of running nose  during gait acitvities and nausea and vomitting.  ) PT Visit Diagnosis: Unsteadiness on feet (R26.81);Other abnormalities of gait and mobility (R26.89);Muscle weakness (generalized) (M62.81);Pain Pain - Right/Left: Left Pain - part of body: Hip;Leg     Time: [id number] PT Time Calculation (min) (ACUTE ONLY): 8 min  Charges:   $Therapeutic Activity: 8-22 mins                    G CodesJoycelyn Sutton:       Lindsey Sutton, PTA pager [phone]    Lindsey Sutton 01/25/2017, 4:38 PM

## 2017-01-25 NOTE — Progress Notes (Signed)
[facility] Progress Note   Subjective: Patient reporting nausea and vomiting beginning midday yesterday. Was unable to eat dinner last night or breakfast this AM. Says last episode of vomiting was about an hour and a half ago. No complaints other than nausea.   Patient will be transitioning to MWF HD while at SNF as opposed to her normal TTS scheduled   Objective Vitals:   01/24/17 1545 01/24/17 1556 01/24/17 2225 01/25/17 0635  BP: 119/62 116/72 134/67 128/64  Pulse: 63 75 69 72  Resp:  15 18 18   Temp:  97.8 F (36.6 C) 97.7 F (36.5 C) 97.5 F (36.4 C)  TempSrc:  Oral Oral Oral  SpO2:  100% 95% 95%  Weight:  96.8 kg (213 lb 6.5 oz)    Height:       Physical Exam General: Lying in bed in HD unit in NAD Heart: RRR, 2/6 systolic murmur appreciated Lungs: CTAB, normal WOB on RA, no wheezes Abdomen: obese, non-tender, mildly distended, +BS Extremities:No LE edema. Wound R & L 5th toes. Bruising noted L hip, dressing clean and intact Dialysis Access: LFA AVF + bruit   Additional Objective Labs: Basic Metabolic Panel:  Recent Labs Lab 01/22/17 0311 01/23/17 0301 01/24/17 0559  NA 131* 131* 128*  K 3.8 3.8 4.2  CL 91* 92* 90*  CO2 24 24 22   GLUCOSE 162* 103* 122*  BUN 38* 47* 58*  CREATININE 4.73* 5.73* 6.55*  CALCIUM 8.5* 8.6* 9.0  PHOS 7.2* 7.8* 8.3*   Liver Function Tests:  Recent Labs Lab 01/19/17 1630  01/22/17 0311 01/23/17 0301 01/24/17 0559  AST 15  --   --   --   --   ALT 8*  --   --   --   --   ALKPHOS 65  --   --   --   --   BILITOT 1.3*  --   --   --   --   PROT 7.0  --   --   --   --   ALBUMIN 3.7  < > 3.3* 3.4* 3.4*  < > = values in this interval not displayed. No results for input(s): LIPASE, AMYLASE in the last 168 hours. CBC:  Recent Labs Lab 01/19/17 1147  01/20/17 2104 01/21/17 0805 01/22/17 0311 01/23/17 0301 01/24/17 0559  WBC 3.6*  < > 4.6 2.8* 3.7* 3.9* 3.3*  NEUTROABS 2.7  --   --   --   --   --   --    HGB 10.2*  < > 9.6* 8.3* 8.2* 7.8* 8.3*  HCT 32.4*  < > 31.0* 27.0* 26.6* 25.6* 26.0*  MCV 89.3  < > 90.6 90.0 91.7 91.4 90.3  PLT 102*  < > 87* 75* 88* 84* 96*  < > = values in this interval not displayed. Blood Culture No results found for: SDES, SPECREQUEST, CULT, REPTSTATUS  Cardiac Enzymes: No results for input(s): CKTOTAL, CKMB, CKMBINDEX, TROPONINI in the last 168 hours. CBG:  Recent Labs Lab 01/23/17 2235 01/24/17 0749 01/24/17 1657 01/24/17 2217 01/25/17 0641  GLUCAP 156* 121* 87 127* 130*   Studies/Results: No results found. Medications: . sodium chloride 10 mL/hr at 01/21/17 0833  . [START ON 01/26/2017] ferric gluconate (FERRLECIT/NULECIT) IV    . methocarbamol (ROBAXIN)  IV     . calcitRIOL  1 mcg Oral Q T,Th,Sa-HD  . cinacalcet  60 mg Oral Q T,Th,Sa-HD  . darbepoetin (ARANESP) injection - DIALYSIS  200 mcg Intravenous Q  Sat-HD  . docusate sodium  100 mg Oral BID  . feeding supplement (NEPRO CARB STEADY)  237 mL Oral Q1500  . folic acid  1 mg Oral BID  . heparin subcutaneous  5,000 Units Subcutaneous Q8H  . insulin aspart  0-5 Units Subcutaneous QHS  . insulin aspart  0-9 Units Subcutaneous TID WC  . mometasone-formoterol  2 puff Inhalation BID  . pantoprazole  40 mg Oral Daily  . sevelamer carbonate  800 mg Oral TID WC  . traZODone  50 mg Oral QHS     Dialysis Orders: Needles T,Th,S 4 hrs 180 NRe 94 kg 2.0 K/2.0 Ca 450/800 -Heparin 5000 units TIW -Senipar 30 mg PO TIW (Last PTH 1453 01/14/17) -Calcitriol 1.0 mcg PO TIW -Mircera 225 mcg IV q 2 weeks (last dose 01/10/17 HGB 10.5 01/14/17) -Venofer 50 mg IV weekly (last dose 01/14/17 Ferritin 977 Fe 42 TSat 19% 01/14/17)  Assessment/Plan: 1. L. Femoral neck fx-S/p intertrochanteric fracture with intramedullary implant per Dr. Roda ShuttersXu. PO day 5. 2. ESRD - Had HD 05/04, 05/05, 05/08. Tx today (05/09) prior to discharge.  3. Anemia - Hgb 8.3 (5/8). Received Aranesp 200 mcg IV with HD 01/21/17. Cont Fe  load. Received 1U PRBC yesterday with HD. 4. Secondary hyperparathyroidism - Cont sensipar, binders, VDRA. Phos 8.3 (5/8). Non-adherent to BMD meds.  5. HTN/volume - HD again today. Lowest BP yesterday 97/62 but primarily normotensive over past 24hrs. Push to goal.  May need EDW adjustment as outpatient. 6. Nutrition - Renal diet, nepro renal vits.  Tarri AbernethyAbigail J Mikisha Roseland, MD, MPH PGY-2 [facility] Pager [phone]

## 2017-01-25 NOTE — Progress Notes (Addendum)
[facility]                                                                              Patient Demographics  Lindsey Sutton, is a 71 y.o. female, DOB - 11-Feb-1946, ZOX:[id number]  Admit date - 01/19/2017   Admitting Physician Ozella Rocks, MD  Outpatient Primary MD for the patient is [name], NP  Outpatient specialists:   LOS - 6  days    Chief Complaint  Patient presents with  . Fall       Brief summary   Lindsey Sutton is an 71 y.o. female with a PMH of ESRD on HD, atrial fibrillation status post pacemaker, chronic iron deficiency anemia, diabetes, and anxiety who was admitted 01/19/17 with chief complaint of left hip pain after suffering from a mechanical fall resulting in a hip fracture.   Assessment & Plan   Principal Problem:   Hip fx (HCC)/Nondisplaced intertrochanteric fracture of the left femur - Orthopedics was consulted, patient underwent intramedullary implant 01/20/17 by Dr. Roda Shutters after being medically optimized.  - Continue oxycodone for pain control, however decreased to 5mg  q6hrs PRN pain (Patient was on 10-15 mg every 4 hours as needed, now having significant nausea and vomiting, possibly ileus) - PT/OT recommending SNF placement. However she will be in different hemodialysis unit, will be needing MWF schedule while at skilled nursing facility as opposed to her normal TTS schedule  Active Problems: Nausea and vomiting - Possibly due to ileus, x-ray shows normal bowel gas pattern -No fevers, abdominal pain, will decrease oxycodone 25 mg every 6 hours as needed, added Senokot  and MiraLAX. Change to clear liquid diet today - hold off on dc     Thrombocytopenia/Pancytopenia - Platelet count improving, 124K  WBC trending up.  -  H&H improving continue to monitor periodically.    Hypertension - Currently BP improving, antihypertensives has been held    Hyponatremia Mild, Likely secondary to cirrhosis physiology given fatty liver  disease.    Atrial fibrillation status post Pacemaker - CHADS2Vasc = 4. Not on chronic anticoagulation.  - 2-D echo showed an EF of 55-60 percent, no regional wall motion abnormalities. Diastolic function could not be evaluated.    Iron deficiency anemia/acute blood loss anemia - 2 g drop in hemoglobin from admission values, likely from operative blood loss. -  Received ESA/iron with dialysis 01/21/17. Continue  Nulecit and folic acid.  - Currently H&H stable    Diabetes mellitus with renal complications - Hemoglobin A1c 5.4%. Glipizide on hold. - Currently being managed with insulin sensitive SSI Q AC/HS.     History of Systolic heart failure (HCC) - Cardiomegaly on chest x-ray. No evidence of systolic dysfunction based on echo.    ESRD (end stage renal disease) (HCC)/secondary hyperparathyroidism/hyperphosphatemia - Nephrology following. HD every Saturday, Tuesday and Thursday. Continue Sensipar, calcitriol and Renvela. - Per nephrology, patient is not discharging to skilled nursing facility near her home dialysis unit hence will dialyze again tomorrow a.m. and then discharged after that    Ascites/fatty liver disease - History of paracentesis in the past. Right upper quadrant ultrasound showed a fatty liver.Spoke  with patient about weight loss.   Obesity (BMI 30-39.9) -Body mass index is 33.84 kg/m. Encouraged weight loss.  COPD -Stable, no wheezing Continue Dulera.   Moderate to severe aortic stenosis - Noted on 2 D echo.    Code Status: Full code DVT Prophylaxis:  Lovenox  Family Communication: Discussed in detail with the patient, all imaging results, lab results explained to the patient, d/w daughter on phone    Disposition Plan: Likely tomorrow, DC held today due to nausea and vomiting   Time Spent in minutes   25 minutes  Procedures:  Hemodialysis  2 D Echo 01/21/17:   Impressions:  - LVEF 55-60, severe LVH, moderate to severe aortic  stenosis, mildly dilated ascending aorta to 3.9 cm, trivial MR, severe biatrial enlargement, pacemaker wires noted, moderate TR, RVSP 61 mmHg, dilated IVC.  Consultants:   Orthopedics Surgery  Antimicrobials:   None   Medications  Scheduled Meds: . calcitRIOL  1 mcg Oral Q M,W,F-HD  . cinacalcet  60 mg Oral Q M,W,F-HD  . [START ON 01/30/2017] darbepoetin (ARANESP) injection - DIALYSIS  200 mcg Intravenous Q Mon-HD  . docusate sodium  100 mg Oral BID  . feeding supplement (NEPRO CARB STEADY)  237 mL Oral Q1500  . folic acid  1 mg Oral BID  . heparin subcutaneous  5,000 Units Subcutaneous Q8H  . insulin aspart  0-5 Units Subcutaneous QHS  . insulin aspart  0-9 Units Subcutaneous TID WC  . mometasone-formoterol  2 puff Inhalation BID  . pantoprazole  40 mg Oral Daily  . polyethylene glycol  17 g Oral BID  . senna-docusate  1 tablet Oral BID  . sevelamer carbonate  800 mg Oral TID WC  . traZODone  50 mg Oral QHS   Continuous Infusions: . sodium chloride 10 mL/hr at 01/21/17 0833  . [START ON 01/27/2017] ferric gluconate (FERRLECIT/NULECIT) IV    . methocarbamol (ROBAXIN)  IV     PRN Meds:.acetaminophen **OR** acetaminophen, alum & mag hydroxide-simeth, diphenhydrAMINE, menthol-cetylpyridinium **OR** phenol, methocarbamol **OR** methocarbamol (ROBAXIN)  IV, metoCLOPramide **OR** metoCLOPramide (REGLAN) injection, morphine injection, ondansetron **OR** ondansetron (ZOFRAN) IV, oxyCODONE, zolpidem   Antibiotics   Anti-infectives    Start     Dose/Rate Route Frequency Ordered Stop   01/20/17 2130  clindamycin (CLEOCIN) IVPB 600 mg     600 mg 100 mL/hr over 30 Minutes Intravenous Every 6 hours 01/20/17 1810 01/21/17 0313   01/20/17 0700  clindamycin (CLEOCIN) IVPB 900 mg  Status:  Discontinued     900 mg 100 mL/hr over 30 Minutes Intravenous On call to O.R. 01/20/17 0650 01/20/17 0653   01/19/17 1630  clindamycin (CLEOCIN) IVPB 900 mg     900 mg 100 mL/hr over 30  Minutes Intravenous To ShortStay [facility] 01/19/17 1616 01/20/17 1600        Subjective:   Lindsey Sutton was seen and examined today. Not feeling well today. Overnight having 2 episodes of nausea and vomiting and this morning as well. Patient post that she did have a BM yesterday. No abdominal pain, no fevers or chills. Patient denies dizziness, chest pain, shortness of breath, new weakness, numbess, tingling. No acute events overnight.    Objective:   Vitals:   01/25/17 1145 01/25/17 1200 01/25/17 1215 01/25/17 1247  BP: 130/72 129/63 125/68 (!) 117/56  Pulse: 74 81 80 75  Resp:    16  Temp:    98.5 F (36.9 C)  TempSrc:    Oral  SpO2:  96%  Weight:    92.6 kg (204 lb 2.3 oz)  Height:        Intake/Output Summary (Last 24 hours) at 01/25/17 1306 Last data filed at 01/25/17 1247  Gross per 24 hour  Intake              335 ml  Output             4940 ml  Net            -4605 ml     Wt Readings from Last 3 Encounters:  01/25/17 92.6 kg (204 lb 2.3 oz)     Exam  General: Alert and oriented x 3, Feeling uncomfortable  HEENT:    Neck: Supple, no JVD, no masses  Cardiovascular: S1 S2 auscultated, 2/6 ,murmurs. RRR  Respiratory: CTAB, no wheezing or rhonchi   Gastrointestinal: Soft,NT, ND, normal bowel sounds   Ext: no cyanosis clubbing or edema  Neuro:no new deficits   Skin: No rashes  Psych:anxious and uncomfortable  Data Reviewed:  I have personally reviewed following labs and imaging studies  Micro Results Recent Results (from the past 240 hour(s))  Surgical pcr screen     Status: None   Collection Time: 01/19/17  5:10 PM  Result Value Ref Range Status   MRSA, PCR NEGATIVE NEGATIVE Final   Staphylococcus aureus NEGATIVE NEGATIVE Final    Comment:        The Xpert SA Assay (FDA approved for NASAL specimens in patients over 621 years of age), is one component of a comprehensive surveillance program.  Test performance has been validated by  [facility] for patients greater than or equal to 70 year old. It is not intended to diagnose infection nor to guide or monitor treatment.     Radiology Reports Dg Chest 1 View  Result Date: 01/19/2017 CLINICAL DATA:  Left femur fracture.  Diabetes. EXAM: CHEST 1 VIEW COMPARISON:  None. FINDINGS: Single lead pacemaker in the region of the right ventricle. Cardiomegaly. Aortic atherosclerosis. Pulmonary venous hypertension without edema. No infiltrate, collapse or effusion. No acute bone finding. IMPRESSION: Single lead pacemaker. Cardiomegaly. Aortic atherosclerosis. Pulmonary venous hypertension. Electronically Signed   By: Paulina FusiMark  Shogry M.D.   On: 01/19/2017 13:38   [place] Abdomen Complete  Result Date: 01/20/2017 CLINICAL DATA:  Possible liver disease abdominal distension EXAM: ABDOMEN ULTRASOUND COMPLETE COMPARISON:  None. FINDINGS: Gallbladder: Multiple shadowing stones, measuring up to 1.2 cm. Slightly thickened wall at 3.7 mm. Negative sonographic Murphy's. Small amount of free fluid in the right upper quadrant. Common bile duct: Diameter: 3.5 mm Liver: Probably enlarged. Increased echogenicity. No focal hepatic abnormality. IVC: No abnormality visualized. Pancreas: Largely obscured by bowel gas. Spleen: Size and appearance within normal limits. Probable accessory splenule. Right Kidney: Length: 9.3 cm. Increased cortical echogenicity. Atrophic. No hydronephrosis. Left Kidney: Length: 8.9 cm. Increased cortical echogenicity. Atrophic. No hydronephrosis. Cyst in the midpole measuring 4.1 x 2.7 x 4 cm Abdominal aorta: Obscured by bowel gas Other findings: Small amount of ascites in the upper abdomen IMPRESSION: 1. Gallstones with slightly thickened wall which is a nonspecific finding. Negative sonographic Murphy's. No biliary dilatation. 2. Enlarged appearing echogenic liver suggesting fatty infiltration 3. Small amount of ascites in the upper abdomen 4. Echogenic atrophic kidneys with no  hydronephrosis. 4.1 cm cyst in the left kidney. Electronically Signed   By: Jasmine PangKim  Fujinaga M.D.   On: 01/20/2017 01:55   Ct Hip Left Wo Contrast  Result Date: 01/19/2017 CLINICAL DATA:  Status post trip and fall today with a left hip fracture. Initial encounter. EXAM: CT OF THE LEFT HIP WITHOUT CONTRAST TECHNIQUE: Multidetector CT imaging of the left hip was performed according to the standard protocol. Multiplanar CT image reconstructions were also generated. COMPARISON:  Plain films left hip earlier today. FINDINGS: Bones/Joint/Cartilage As seen on the comparison plain films, the patient has a nondisplaced fracture of the left hip extending from the base of the femoral neck superiorly along the intertrochanteric ridge to the lesser trochanter. The left hip is located. No other fracture is identified. Mild degenerative changes seen about the left hip Ligaments Suboptimally assessed by CT. Muscles and Tendons Intact. Soft tissues There is a large volume of pelvic ascites. Extensive iliac atherosclerosis is identified. Calcified uterine fibroids are noted. IMPRESSION: Acute nondisplaced left hip fracture extends from the base the femoral neck superiorly along the trochanteric region of the lesser trochanter. No other acute bony abnormality. Large volume of pelvic ascites. Extensive atherosclerosis. Electronically Signed   By: Drusilla Kanner M.D.   On: 01/19/2017 15:48   Dg Abd Portable 2v  Result Date: 01/25/2017 CLINICAL DATA:  Nausea and vomiting. EXAM: PORTABLE ABDOMEN - 2 VIEW COMPARISON:  None. FINDINGS: Normal bowel gas pattern. Negative for bowel obstruction or ileus. No free air. Atherosclerotic calcification. Left hip fracture repair. Single lead pacemaker. IMPRESSION: Normal bowel gas pattern. Electronically Signed   By: Marlan Palau M.D.   On: 01/25/2017 09:38   Dg C-arm 1-60 Min  Result Date: 01/20/2017 CLINICAL DATA:  Left intramedullary nail. EXAM: OPERATIVE LEFT HIP (WITH PELVIS IF  PERFORMED) 2 VIEWS TECHNIQUE: Fluoroscopic spot image(s) were submitted for interpretation post-operatively. COMPARISON:  Radiography from yesterday FINDINGS: Fluoroscopy showing proximal femur fracture reduction and internal fixation. No unexpected finding. IMPRESSION: Fluoroscopy for left femur fracture ORIF.  No unexpected finding. Electronically Signed   By: Marnee Spring M.D.   On: 01/20/2017 16:40   Dg Hip Operative Unilat W Or W/o Pelvis Left  Result Date: 01/20/2017 CLINICAL DATA:  Left intramedullary nail. EXAM: OPERATIVE LEFT HIP (WITH PELVIS IF PERFORMED) 2 VIEWS TECHNIQUE: Fluoroscopic spot image(s) were submitted for interpretation post-operatively. COMPARISON:  Radiography from yesterday FINDINGS: Fluoroscopy showing proximal femur fracture reduction and internal fixation. No unexpected finding. IMPRESSION: Fluoroscopy for left femur fracture ORIF.  No unexpected finding. Electronically Signed   By: Marnee Spring M.D.   On: 01/20/2017 16:40   Dg Hip Unilat With Pelvis 2-3 Views Left  Result Date: 01/19/2017 CLINICAL DATA:  Fall.  Left hip pain. EXAM: DG HIP (WITH OR WITHOUT PELVIS) 2-3V LEFT COMPARISON:  No recent prior . FINDINGS: Diffuse osteopenia degenerative change. Fracture of the base of the left femoral neck appears to be present. No prominent displacement. Aortoiliac atherosclerotic vascular calcification. IMPRESSION: 1. Nondisplaced fracture of the base of the left femoral neck appears to be present. 2. Severe osteopenia. Degenerative changes lumbar spine and both hips. 3. Aortoiliac atherosclerotic vascular disease. Electronically Signed   By: Maisie Fus  Register   On: 01/19/2017 12:49   Dg Knee 3 Views Left  Result Date: 01/19/2017 CLINICAL DATA:  Closed left hip fracture. Clinical concern for a knee fracture. EXAM: LEFT KNEE - 3 VIEW COMPARISON:  None. FINDINGS: Diffuse osteopenia. Marked medial and lateral joint space narrowing. Mild patellofemoral spur formation. Moderate-sized  effusion. Anterior and medial soft tissue swelling. No fracture or dislocation visualized on limited views due to limited ability of the patient to cooperate for positioning due to the hip fracture. Dense atheromatous arterial  calcifications. IMPRESSION: Limited examination demonstrating a moderate-sized effusion, tricompartmental degenerative changes and no visible fracture. If there is a continued clinical concern for the possibility of a knee fracture, a knee CT without contrast would be recommended since the patient was unable to cooperate fully for positioning on the radiographs. Electronically Signed   By: Beckie Salts M.D.   On: 01/19/2017 15:31    Lab Data:  CBC:  Recent Labs Lab 01/19/17 1147  01/21/17 0805 01/22/17 0311 01/23/17 0301 01/24/17 0559 01/25/17 0850  WBC 3.6*  < > 2.8* 3.7* 3.9* 3.3* 4.7  NEUTROABS 2.7  --   --   --   --   --   --   HGB 10.2*  < > 8.3* 8.2* 7.8* 8.3* 9.3*  HCT 32.4*  < > 27.0* 26.6* 25.6* 26.0* 30.0*  MCV 89.3  < > 90.0 91.7 91.4 90.3 90.9  PLT 102*  < > 75* 88* 84* 96* 124*  < > = values in this interval not displayed. Basic Metabolic Panel:  Recent Labs Lab 01/20/17 0613  01/21/17 0805 01/22/17 0311 01/23/17 0301 01/24/17 0559 01/25/17 0850  NA  --   --  130* 131* 131* 128* 133*  K  --   --  4.0 3.8 3.8 4.2 3.8  CL  --   --  94* 91* 92* 90* 91*  CO2  --   --  20* 24 24 22 25   GLUCOSE  --   --  156* 162* 103* 122* 132*  BUN  --   --  50* 38* 47* 58* 42*  CREATININE  --   < > 5.35* 4.73* 5.73* 6.55* 5.37*  CALCIUM  --   --  8.2* 8.5* 8.6* 9.0 9.8  PHOS 9.7*  --   --  7.2* 7.8* 8.3* 7.1*  < > = values in this interval not displayed. GFR: Estimated Creatinine Clearance: 11.4 mL/min (A) (by C-G formula based on SCr of 5.37 mg/dL (H)). Liver Function Tests:  Recent Labs Lab 01/19/17 1630 01/20/17 1610 01/22/17 0311 01/23/17 0301 01/24/17 0559 01/25/17 0850  AST 15  --   --   --   --   --   ALT 8*  --   --   --   --   --    ALKPHOS 65  --   --   --   --   --   BILITOT 1.3*  --   --   --   --   --   PROT 7.0  --   --   --   --   --   ALBUMIN 3.7 3.4* 3.3* 3.4* 3.4* 3.6   No results for input(s): LIPASE, AMYLASE in the last 168 hours. No results for input(s): AMMONIA in the last 168 hours. Coagulation Profile:  Recent Labs Lab 01/19/17 1147  INR 1.35   Cardiac Enzymes: No results for input(s): CKTOTAL, CKMB, CKMBINDEX, TROPONINI in the last 168 hours. BNP (last 3 results) No results for input(s): PROBNP in the last 8760 hours. HbA1C: No results for input(s): HGBA1C in the last 72 hours. CBG:  Recent Labs Lab 01/23/17 2235 01/24/17 0749 01/24/17 1657 01/24/17 2217 01/25/17 0641  GLUCAP 156* 121* 87 127* 130*   Lipid Profile: No results for input(s): CHOL, HDL, LDLCALC, TRIG, CHOLHDL, LDLDIRECT in the last 72 hours. Thyroid Function Tests: No results for input(s): TSH, T4TOTAL, FREET4, T3FREE, THYROIDAB in the last 72 hours. Anemia Panel: No results for input(s): VITAMINB12, FOLATE, FERRITIN, TIBC,  IRON, RETICCTPCT in the last 72 hours. Urine analysis: No results found for: COLORURINE, APPEARANCEUR, LABSPEC, PHURINE, GLUCOSEU, HGBUR, BILIRUBINUR, KETONESUR, PROTEINUR, UROBILINOGEN, NITRITE, LEUKOCYTESUR   Jaquil Todt M.D. [facility] 01/25/2017, 1:06 PM  Pager: [phone] Between 7am to 7pm - call Pager - [phone]  After 7pm go to [email or web address] - password [email or web address]  Call night coverage person covering after 7pm

## 2017-01-25 NOTE — Procedures (Signed)
Patient seen and examined on Hemodialysis. QB 400 UF goal 2.5L  Having some n/v--? Related to pain meds.  Don't think cardiac issue.    Treatment adjusted as needed.  Bufford ButtnerElizabeth Bruce Mayers MD [facility] 10:12 AM

## 2017-01-25 NOTE — Progress Notes (Signed)
OT Cancellation    01/25/17 1000  OT Visit Information  Last OT Received On 01/25/17  Reason Eval/Treat Not Completed Patient at procedure or test/ unavailable (HD. Will check back as schedule allows.)   [facility] Mylene Bow, OTR/L [phone]

## 2017-01-25 NOTE — Clinical Social Work Placement (Addendum)
   CLINICAL SOCIAL WORK PLACEMENT  NOTE  Date:  01/25/2017  Patient Details  Name: Lindsey Sutton MRN: [id number] Date of Birth: 12/09/1945  Clinical Social Work is seeking post-discharge placement for this patient at the Skilled  Nursing Facility level of care (*CSW will initial, date and re-position this form in  chart as items are completed):  Yes   Patient/family provided with [facility] Clinical Social Work Department's list of facilities offering this level of care within the geographic area requested by the patient (or if unable, by the patient's family).  Yes   Patient/family informed of their freedom to choose among providers that offer the needed level of care, that participate in Medicare, Medicaid or managed care program needed by the patient, have an available bed and are willing to accept the patient.  Yes   Patient/family informed of [facility]'s ownership interest in [facility] and [facility], as well as of the fact that they are under no obligation to receive care at these facilities.  PASRR submitted to EDS on       PASRR number received on 01/23/17     Existing PASRR number confirmed on 01/23/17     FL2 transmitted to all facilities in geographic area requested by pt/family on 01/23/17     FL2 transmitted to all facilities within larger geographic area on 01/23/17     Patient informed that his/her managed care company has contracts with or will negotiate with certain facilities, including the following:        Yes   Patient/family informed of bed offers received.  Patient chooses bed at [facility] [place]     Physician recommends and patient chooses bed at      Patient to be transferred to [facility] [place] on 01/26/17.  Patient to be transferred to facility by [facility]     Patient family notified on 01/26/17 of transfer.  Name of family member notified:  daughter at home     PHYSICIAN Please prepare priority discharge summary, including  medications, Please prepare prescriptions, Please sign FL2     Additional Comment:    _______________________________________________ Tresa MoorePatricia V Brylinn Teaney, LCSW 01/25/2017, 11:30 AM

## 2017-01-25 NOTE — Progress Notes (Signed)
PT Cancellation Note  Patient Details Name: Lindsey Sutton MRN: [id number] DOB: 12/26/1945   Cancelled Treatment:    Reason Eval/Treat Not Completed: Other (comment);Patient at procedure or test/unavailable;Pain limiting ability to participate (Pt remains at hemodialysis.  Will f/u pending return.)   [name] Artis DelayJ [name] 01/25/2017, 1:46 PM Joycelyn RuaAimee Sabreena Vogan, PTA pager [phone]

## 2017-01-25 NOTE — Progress Notes (Addendum)
Physical Therapy Treatment Patient Details Name: Lindsey PearCarol Harte MRN: [id number] DOB: 04/24/1946 Today's Date: 01/25/2017    History of Present Illness Patient is a 71 yo female admitted 01/19/17 following fall with Lt hip fx.  Patient s/p IM nailing of fracture Lt hip.    PMH:  ESRD on HD, HTN, Afib, pacemaker, anemia, DM, anxiety, CHF    PT Comments    Pt performed increased activity during session but extremely slow with her movements.  Post session patient reclined in chair and began to vomit.  Pt also with runny nose during gait training that continued throughout tx.  Informed nursing.  Plan for SNF remains appropriate.    Follow Up Recommendations  SNF;Supervision/Assistance - 24 hour     Equipment Recommendations  Wheelchair (measurements PT);Wheelchair cushion (measurements PT)    Recommendations for Other Services       Precautions / Restrictions Precautions Precautions: Fall Restrictions Weight Bearing Restrictions: Yes LLE Weight Bearing: Weight bearing as tolerated    Mobility  Bed Mobility Overal bed mobility: Needs Assistance       Supine to sit: Mod assist     General bed mobility comments: Pt required assist with B LEs and elevation of trunk into a seated position.    Transfers Overall transfer level: Needs assistance Equipment used: Rolling walker (2 wheeled) Transfers: Sit to/from Stand Sit to Stand: Mod assist;+2 physical assistance (Initially mod+2, returned to seated position min +1.  )         General transfer comment: Cues for hand placement, assist to faciliate forward weight shifting and to boost patient into standing.  Pt forward flexed and required assist for extension of trunk.  Pt with improved ease of mobility during session but require a lengthy band of time to complete activities.    Ambulation/Gait Ambulation/Gait assistance: Mod assist;Min assist Ambulation Distance (Feet): 15 Feet Assistive device: Rolling walker (2 wheeled) Gait  Pattern/deviations: Step-to pattern;Shuffle;Decreased stride length;Trunk flexed;Antalgic     General Gait Details: Cues for sequencing, RW safety, upper trunk control and advancing steps forward.     Stairs            Wheelchair Mobility    Modified Rankin (Stroke Patients Only)       Balance Overall balance assessment: Needs assistance   Sitting balance-Leahy Scale: Fair Sitting balance - Comments: UE support to maintain balance.     Standing balance-Leahy Scale: Poor                              Cognition Arousal/Alertness: Awake/alert Behavior During Therapy: WFL for tasks assessed/performed;Anxious Overall Cognitive Status: Within Functional Limits for tasks assessed                                        Exercises      General Comments        Pertinent Vitals/Pain Pain Assessment: 0-10 Pain Score: 7  Pain Location: Lt hip/leg Pain Descriptors / Indicators: Aching;Grimacing;Sore Pain Intervention(s): Monitored during session;Repositioned    Home Living                      Prior Function            PT Goals (current goals can now be found in the care plan section) Acute Rehab PT Goals Patient Stated Goal: To walk  Potential to Achieve Goals: Good Progress towards PT goals: Progressing toward goals    Frequency    Min 4X/week      PT Plan Current plan remains appropriate    Co-evaluation PT/OT/SLP Co-Evaluation/Treatment: Yes Reason for Co-Treatment: Necessary to address cognition/behavior during functional activity;For patient/therapist safety PT goals addressed during session: Mobility/safety with mobility OT goals addressed during session: ADL's and self-care      AM-PAC PT "6 Clicks" Daily Activity  Outcome Measure  Difficulty turning over in bed (including adjusting bedclothes, sheets and blankets)?: Total Difficulty moving from lying on back to sitting on the side of the bed? :  Total Difficulty sitting down on and standing up from a chair with arms (e.g., wheelchair, bedside commode, etc,.)?: Total Help needed moving to and from a bed to chair (including a wheelchair)?: A Lot Help needed walking in hospital room?: A Lot Help needed climbing 3-5 steps with a railing? : A Lot 6 Click Score: 9    End of Session Equipment Utilized During Treatment: Gait belt Activity Tolerance: Patient limited by pain;Patient limited by fatigue Patient left: with call bell/phone within reach;with family/visitor present;in chair Nurse Communication: Mobility status (informed nursing of running nose during gait acitvities and nausea and vomitting.  ) PT Visit Diagnosis: Unsteadiness on feet (R26.81);Other abnormalities of gait and mobility (R26.89);Muscle weakness (generalized) (M62.81);Pain Pain - Right/Left: Left Pain - part of body: Hip;Leg     Time: 2956-2130 PT Time Calculation (min) (ACUTE ONLY): 39 min  Charges:  $Gait Training: 23-37 mins                    G Codes:       [name], PTA pager [phone]    Florestine Avers 01/25/2017, 4:22 PM

## 2017-01-26 LAB — BASIC METABOLIC PANEL
Anion gap: 13 (ref 5–15)
BUN: 31 mg/dL — AB (ref 6–20)
CHLORIDE: 93 mmol/L — AB (ref 101–111)
CO2: 27 mmol/L (ref 22–32)
Calcium: 9.8 mg/dL (ref 8.9–10.3)
Creatinine, Ser: 4.15 mg/dL — ABNORMAL HIGH (ref 0.44–1.00)
GFR calc Af Amer: 12 mL/min — ABNORMAL LOW (ref >60)
GFR calc non Af Amer: 10 mL/min — ABNORMAL LOW (ref >60)
GLUCOSE: 109 mg/dL — AB (ref 65–99)
Potassium: 3.5 mmol/L (ref 3.5–5.1)
Sodium: 133 mmol/L — ABNORMAL LOW (ref 135–145)

## 2017-01-26 LAB — CBC
HEMATOCRIT: 29 % — AB (ref 36.0–46.0)
HEMOGLOBIN: 8.8 g/dL — AB (ref 12.0–15.0)
MCH: 27.9 pg (ref 26.0–34.0)
MCHC: 30.3 g/dL (ref 30.0–36.0)
MCV: 92.1 fL (ref 78.0–100.0)
Platelets: 140 10*3/uL — ABNORMAL LOW (ref 150–400)
RBC: 3.15 MIL/uL — ABNORMAL LOW (ref 3.87–5.11)
RDW: 17.2 % — ABNORMAL HIGH (ref 11.5–15.5)
WBC: 4.3 10*3/uL (ref 4.0–10.5)

## 2017-01-26 LAB — GLUCOSE, CAPILLARY
GLUCOSE-CAPILLARY: 102 mg/dL — AB (ref 65–99)
GLUCOSE-CAPILLARY: 112 mg/dL — AB (ref 65–99)

## 2017-01-26 MED ORDER — LACTULOSE 10 GM/15ML PO SOLN
20.0000 g | Freq: Once | ORAL | Status: AC
  Administered 2017-01-26: 20 g via ORAL
  Filled 2017-01-26: qty 30

## 2017-01-26 MED ORDER — POLYETHYLENE GLYCOL 3350 17 G PO PACK: 17.0000 g | PACK | Freq: Every day | ORAL | 0 refills | Status: DC

## 2017-01-26 MED ORDER — BISACODYL 10 MG RE SUPP
10.0000 mg | Freq: Once | RECTAL | Status: AC
  Administered 2017-01-26: 10 mg via RECTAL
  Filled 2017-01-26: qty 1

## 2017-01-26 MED ORDER — SENNOSIDES-DOCUSATE SODIUM 8.6-50 MG PO TABS: 1.0000 | ORAL_TABLET | Freq: Two times a day (BID) | ORAL | Status: AC

## 2017-01-26 NOTE — Progress Notes (Signed)
[facility] Nurse called and give report to Ellin MayhewKelly Adams. IV removed. Patient is waiting for [facility] for transportation

## 2017-01-26 NOTE — Progress Notes (Signed)
[facility] Progress Note   Subjective: Patient reports significant improvement in nausea today. Says she is ready to go home. Likely discharge to SNF today.   Objective Vitals:   01/25/17 1247 01/25/17 1949 01/25/17 2100 01/26/17 0455  BP: (!) 117/56  (!) 159/80 131/90  Pulse: 75  73 79  Resp: 16     Temp: 98.5 F (36.9 C)  98.7 F (37.1 C) 98.8 F (37.1 C)  TempSrc: Oral  Oral Oral  SpO2: 96% 98% 94% 99%  Weight: 92.6 kg (204 lb 2.3 oz)     Height:       Physical Exam General: Lying in bed in NAD Heart: RRR, 2/6 systolic murmur appreciated Lungs: CTAB, normal WOB on RA, no wheezes Abdomen: obese, non-tender, mildly distended, +BS Extremities:No LE edema. Wound R & L 5th toes. Bruising noted L hip, dressing clean and intact Dialysis Access: LFA AVF + bruit   Additional Objective Labs: Basic Metabolic Panel:  Recent Labs Lab 01/23/17 0301 01/24/17 0559 01/25/17 0850 01/26/17 0449  NA 131* 128* 133* 133*  K 3.8 4.2 3.8 3.5  CL 92* 90* 91* 93*  CO2 24 22 25 27   GLUCOSE 103* 122* 132* 109*  BUN 47* 58* 42* 31*  CREATININE 5.73* 6.55* 5.37* 4.15*  CALCIUM 8.6* 9.0 9.8 9.8  PHOS 7.8* 8.3* 7.1*  --    Liver Function Tests:  Recent Labs Lab 01/19/17 1630  01/23/17 0301 01/24/17 0559 01/25/17 0850  AST 15  --   --   --   --   ALT 8*  --   --   --   --   ALKPHOS 65  --   --   --   --   BILITOT 1.3*  --   --   --   --   PROT 7.0  --   --   --   --   ALBUMIN 3.7  < > 3.4* 3.4* 3.6  < > = values in this interval not displayed.  Recent Labs Lab 01/25/17 1400  LIPASE 23   CBC:  Recent Labs Lab 01/19/17 1147  01/22/17 0311 01/23/17 0301 01/24/17 0559 01/25/17 0850 01/26/17 0449  WBC 3.6*  < > 3.7* 3.9* 3.3* 4.7 4.3  NEUTROABS 2.7  --   --   --   --   --   --   HGB 10.2*  < > 8.2* 7.8* 8.3* 9.3* 8.8*  HCT 32.4*  < > 26.6* 25.6* 26.0* 30.0* 29.0*  MCV 89.3  < > 91.7 91.4 90.3 90.9 92.1  PLT 102*  < > 88* 84* 96* 124* 140*  < > =  values in this interval not displayed. Blood Culture No results found for: SDES, SPECREQUEST, CULT, REPTSTATUS  Cardiac Enzymes: No results for input(s): CKTOTAL, CKMB, CKMBINDEX, TROPONINI in the last 168 hours. CBG:  Recent Labs Lab 01/24/17 2217 01/25/17 0641 01/25/17 1628 01/25/17 2136 01/26/17 0723  GLUCAP 127* 130* 132* 127* 102*   Studies/Results: Dg Abd Portable 2v  Result Date: 01/25/2017 CLINICAL DATA:  Nausea and vomiting. EXAM: PORTABLE ABDOMEN - 2 VIEW COMPARISON:  None. FINDINGS: Normal bowel gas pattern. Negative for bowel obstruction or ileus. No free air. Atherosclerotic calcification. Left hip fracture repair. Single lead pacemaker. IMPRESSION: Normal bowel gas pattern. Electronically Signed   By: Marlan Palau M.D.   On: 01/25/2017 09:38   Medications: . sodium chloride 10 mL/hr at 01/21/17 0833  . [START ON 01/27/2017] ferric gluconate (FERRLECIT/NULECIT) IV    .  methocarbamol (ROBAXIN)  IV     . bisacodyl  10 mg Rectal Once  . calcitRIOL  1 mcg Oral Q M,W,F-HD  . cinacalcet  60 mg Oral Q M,W,F-HD  . [START ON 01/30/2017] darbepoetin (ARANESP) injection - DIALYSIS  200 mcg Intravenous Q Mon-HD  . docusate sodium  100 mg Oral BID  . feeding supplement (NEPRO CARB STEADY)  237 mL Oral Q1500  . folic acid  1 mg Oral BID  . heparin subcutaneous  5,000 Units Subcutaneous Q8H  . insulin aspart  0-5 Units Subcutaneous QHS  . insulin aspart  0-9 Units Subcutaneous TID WC  . lactulose  20 g Oral Once  . mometasone-formoterol  2 puff Inhalation BID  . pantoprazole  40 mg Oral Daily  . polyethylene glycol  17 g Oral BID  . senna-docusate  1 tablet Oral BID  . sevelamer carbonate  800 mg Oral TID WC  . traZODone  50 mg Oral QHS     Dialysis Orders: [place] T,Th,S 4 hrs 180 NRe 94 kg 2.0 K/2.0 Ca 450/800 -Heparin 5000 units TIW -Senipar 30 mg PO TIW (Last PTH 1453 01/14/17) -Calcitriol 1.0 mcg PO TIW -Mircera 225 mcg IV q 2 weeks (last dose 01/10/17 HGB 10.5  01/14/17) -Venofer 50 mg IV weekly (last dose 01/14/17 Ferritin 977 Fe 42 TSat 19% 01/14/17)  Assessment/Plan: 1. L. Femoral neck fx - S/p intertrochanteric fracture with intramedullary implant per Dr. Roda ShuttersXu. Post-op day 6. 2. ESRD - Had HD 05/04, 05/05, 05/08, 05/09. Now on MWF in preparation for new HD schedule once discharged to SNF. Reached goal UF yesterday at 2.4L.  3. Anemia - Hgb stable at 8.8 today s/p 1U PRBC yesterday. Received Aranesp 200 mcg IV with HD 01/21/17. Cont Fe load.  4. Secondary hyperparathyroidism - Cont Sensipar, binders, VDRA. Phos 7.1 (5/9). Non-adherent to BMD meds.  5. HTN/volume - Normo- to hypertensive in past 24 hrs. Volume status improved today. May need EDW adjustment as outpatient. 6. Nutrition - Renal diet, nepro renal vits.  Tarri AbernethyAbigail J Mortimer Bair, MD, MPH PGY-2 [facility] Pager [phone]

## 2017-01-26 NOTE — Social Work (Signed)
Clinical Social Worker facilitated patient discharge including contacting patient family and facility to confirm patient discharge plans.  Clinical information faxed to facility and family agreeable with plan.  CSW [facility]  RN to call [phone] report prior to discharge. Pt going to Rm 208.  Clinical Social Worker will sign off for now as social work intervention is no longer needed. Please consult us again if new need arises.  Keene BreathPatricia Tayvien Kane, LCSW Clinical Social Worker [phone]

## 2017-01-26 NOTE — Discharge Summary (Signed)
Physician Discharge Summary   Patient ID: Lindsey Sutton MRN: [id number] DOB/AGE: 70/24/1947 70 y.o.  Admit date: 01/19/2017 Discharge date: 01/26/2017  Primary Care Physician:  [name], NP  Discharge Diagnoses:    . Nondisplaced intertrochanteric fracture of left femur (HCC) . Iron deficiency anemia . Pacemaker . A-fib (HCC) . Systolic heart failure (HCC) . ESRD (end stage renal disease) (HCC) . Ascites . DM (diabetes mellitus), type 2 with renal complications (HCC) . Obesity (BMI 30-39.9) . Essential hypertension . Hyponatremia . NAFLD (nonalcoholic fatty liver disease) . Thrombocytopenia (HCC) . Secondary hyperparathyroidism of renal origin (HCC) . Hyperphosphatemia . Pancytopenia [facility])   Consults:  Nephrology Orthopedics  Recommendations for Outpatient Follow-up:  1. Please repeat CBC/BMET at next visit 2. Please continue Percocet only 1 tablet 5/325 mg every 4 hours as needed for severe pain. Try Tylenol first for moderate pain   DIET: Carb modified diet    Allergies:   Allergies  Allergen Reactions  . Penicillins     Has patient had a PCN reaction causing immediate rash, facial/tongue/throat swelling, SOB or lightheadedness with hypotension: Yes Has patient had a PCN reaction causing severe rash involving mucus membranes or skin necrosis: No Has patient had a PCN reaction that required hospitalization No Has patient had a PCN reaction occurring within the last 10 years: No If all of the above answers are "NO", then may proceed with Cephalosporin use.      DISCHARGE MEDICATIONS: Current Discharge Medication List    START taking these medications   Details  enoxaparin (LOVENOX) 30 MG/0.3ML injection Inject 0.3 mLs (30 mg total) into the skin daily. Qty: 28 Syringe, Refills: 0    methocarbamol (ROBAXIN) 500 MG tablet Take 1 tablet (500 mg total) by mouth every 6 (six) hours as needed for muscle spasms.    Nutritional Supplements (FEEDING  SUPPLEMENT, NEPRO CARB STEADY,) LIQD Take 237 mLs by mouth daily at 3 pm. Refills: 0    ondansetron (ZOFRAN) 4 MG tablet Take 1 tablet (4 mg total) by mouth every 6 (six) hours as needed for nausea. Qty: 20 tablet, Refills: 0    oxyCODONE-acetaminophen (PERCOCET) 5-325 MG tablet Take 1-2 tablets by mouth every 4 (four) hours as needed for severe pain. Qty: 90 tablet, Refills: 0    pantoprazole (PROTONIX) 40 MG tablet Take 1 tablet (40 mg total) by mouth daily.    polyethylene glycol (MIRALAX / GLYCOLAX) packet Take 17 g by mouth daily. Qty: 14 each, Refills: 0    senna-docusate (SENOKOT-S) 8.6-50 MG tablet Take 1 tablet by mouth 2 (two) times daily.      CONTINUE these medications which have NOT CHANGED   Details  acetaminophen (TYLENOL) 650 MG CR tablet Take 650 mg by mouth every 8 (eight) hours as needed for pain.    budesonide-formoterol (SYMBICORT) 160-4.5 MCG/ACT inhaler Inhale 2 puffs into the lungs 2 (two) times daily.    cinacalcet (SENSIPAR) 30 MG tablet Take 30 mg by mouth daily with supper.    diphenhydrAMINE (SOMINEX) 25 MG tablet Take 25 mg by mouth at bedtime as needed for sleep.    folic acid (FOLVITE) 1 MG tablet Take 1 mg by mouth 2 (two) times daily.    glipiZIDE (GLUCOTROL) 5 MG tablet Take by mouth daily before breakfast.    sevelamer carbonate (RENVELA) 800 MG tablet Take 800 mg by mouth 3 (three) times daily with meals.    traZODone (DESYREL) 50 MG tablet Take 50 mg by mouth at bedtime.  STOP taking these medications     cloNIDine (CATAPRES) 0.3 MG tablet      furosemide (LASIX) 40 MG tablet      Lidocaine-Prilocaine, Bulk, 2.5-2.5 % CREA          Brief H and P: For complete details please refer to admission H and P, but in briefCarol Andersonis an 71 y.o.femalewith a PMH of ESRD on HD, atrial fibrillation status post pacemaker, chronic iron deficiency anemia, diabetes, and anxiety who was admitted 01/19/17 with chief complaint of left hip  pain after suffering from a mechanical fall resulting in a hip fracture.   Hospital Course:   Nondisplaced intertrochanteric fracture of the left femur - Patient was admitted for left hip fracture. Orthopedics was consulted. - patient underwent intramedullary implant 01/20/17 by Dr. Roda Shutters after being medically optimized.  - Patient was placed on oxycodone for pain control, however decreased dose significantly as she developed nausea and vomiting possibly ileus from narcotics. Recommend strongly to use narcotics only for severe pain and give Tylenol for moderate pain - PT/OT recommending SNF placement. However she will be in different hemodialysis unit, will be needing MWF schedule while at skilled nursing facility as opposed to her normal TTS schedule. Patient received back-to-back hemodialysis on 01/24/17 and 01/25/17. Next dialysis on Saturday. - Please continue bowel regimen while on narcotics  Active Problems: Nausea and vomiting - Possibly due to ileus, x-ray shows normal bowel gas pattern -No fevers, abdominal pain, oxycodone was significantly decreased.  -Patient feels a lot better, no more nausea and vomiting, diet advanced to solids, patient is tolerating.   Thrombocytopenia/Pancytopenia - Platelet countcontinues to improve, 140 K today -  H&H stable  Hypertension -  BP currently stable. Continue to hold Lasix and clonidine  Hyponatremia Mild, Likely secondary to cirrhosis physiology given fatty liver disease.  Atrial fibrillation status post Pacemaker - CHADS2Vasc = 4. Not on chronic anticoagulation.  - 2-D echo showed an EF of 55-60 percent, no regional wall motion abnormalities. Diastolic function could not be evaluated.  Iron deficiency anemia/acute blood loss anemia Likely postop anemia - 2 g drop in hemoglobin from admission values, likely postop anemia  -  Received ESA/iron with dialysis 01/21/17. -  Continue Nulecit and folic acid.  - Currently H&H  stable  Diabetes mellitus with renal complications - Hemoglobin A1c 5.4%. - Glipizide was placed on hold and patient was managed with insulin sliding scale while inpatient - Patient may restart glipizide upon discharge  History of Systolic heart failure (HCC) - Cardiomegaly on chest x-ray. No evidence of systolic dysfunction based on echo.  ESRD (end stage renal disease) (HCC)/secondary hyperparathyroidism/hyperphosphatemia - Nephrology was consulted, Continue Sensipar, calcitriol and Renvela. - Per nephrology, Patient will be transitioning to MWF HD while at SNF rehab as opposed to her normal TTS scheduled  Ascites/fatty liver disease - History of paracentesis in the past. Right upper quadrant ultrasound showed a fatty liver.Spoke with patient about weight loss.  Obesity (BMI 30-39.9) - Body mass index is 33.84 kg/m.Encouraged weight loss.  COPD -Stable, no wheezing Continue Dulera.   Moderate to severe aortic stenosis - 2-D echo showed calcified aortic valve with moderate to severe stenosis, gradient 61 mmHg  Day of Discharge BP 131/90 (BP Location: Right Arm)   Pulse 79   Temp 98.8 F (37.1 C) (Oral)   Resp 16   Ht 5\' 7"  (1.702 m)   Wt 92.6 kg (204 lb 2.3 oz)   SpO2 98% Comment: continuous pulse ox  BMI  31.97 kg/m   Physical Exam: General: Alert and awake oriented x3 not in any acute distress. HEENT: anicteric sclera, pupils reactive to light and accommodation CVS: S1-S2 clear, 2/6 SEM  Chest: clear to auscultation bilaterally, no wheezing rales or rhonchi Abdomen: soft nontender, nondistended, normal bowel sounds Extremities: no cyanosis, clubbing or edema noted bilaterally Neuro: Cranial nerves II-XII intact, no focal neurological deficits   The results of significant diagnostics from this hospitalization (including imaging, microbiology, ancillary and laboratory) are listed below for reference.    LAB RESULTS: Basic Metabolic Panel:  Recent  Labs Lab 01/25/17 0850 01/26/17 0449  NA 133* 133*  K 3.8 3.5  CL 91* 93*  CO2 25 27  GLUCOSE 132* 109*  BUN 42* 31*  CREATININE 5.37* 4.15*  CALCIUM 9.8 9.8  PHOS 7.1*  --    Liver Function Tests:  Recent Labs Lab 01/19/17 1630  01/24/17 0559 01/25/17 0850  AST 15  --   --   --   ALT 8*  --   --   --   ALKPHOS 65  --   --   --   BILITOT 1.3*  --   --   --   PROT 7.0  --   --   --   ALBUMIN 3.7  < > 3.4* 3.6  < > = values in this interval not displayed.  Recent Labs Lab 01/25/17 1400  LIPASE 23   No results for input(s): AMMONIA in the last 168 hours. CBC:  Recent Labs Lab 01/19/17 1147  01/25/17 0850 01/26/17 0449  WBC 3.6*  < > 4.7 4.3  NEUTROABS 2.7  --   --   --   HGB 10.2*  < > 9.3* 8.8*  HCT 32.4*  < > 30.0* 29.0*  MCV 89.3  < > 90.9 92.1  PLT 102*  < > 124* 140*  < > = values in this interval not displayed. Cardiac Enzymes: No results for input(s): CKTOTAL, CKMB, CKMBINDEX, TROPONINI in the last 168 hours. BNP: Invalid input(s): POCBNP CBG:  Recent Labs Lab 01/25/17 2136 01/26/17 0723  GLUCAP 127* 102*    Significant Diagnostic Studies:  Dg Chest 1 View  Result Date: 01/19/2017 CLINICAL DATA:  Left femur fracture.  Diabetes. EXAM: CHEST 1 VIEW COMPARISON:  None. FINDINGS: Single lead pacemaker in the region of the right ventricle. Cardiomegaly. Aortic atherosclerosis. Pulmonary venous hypertension without edema. No infiltrate, collapse or effusion. No acute bone finding. IMPRESSION: Single lead pacemaker. Cardiomegaly. Aortic atherosclerosis. Pulmonary venous hypertension. Electronically Signed   By: Paulina FusiMark  Shogry M.D.   On: 01/19/2017 13:38   [place] Abdomen Complete  Result Date: 01/20/2017 CLINICAL DATA:  Possible liver disease abdominal distension EXAM: ABDOMEN ULTRASOUND COMPLETE COMPARISON:  None. FINDINGS: Gallbladder: Multiple shadowing stones, measuring up to 1.2 cm. Slightly thickened wall at 3.7 mm. Negative sonographic Murphy's. Small  amount of free fluid in the right upper quadrant. Common bile duct: Diameter: 3.5 mm Liver: Probably enlarged. Increased echogenicity. No focal hepatic abnormality. IVC: No abnormality visualized. Pancreas: Largely obscured by bowel gas. Spleen: Size and appearance within normal limits. Probable accessory splenule. Right Kidney: Length: 9.3 cm. Increased cortical echogenicity. Atrophic. No hydronephrosis. Left Kidney: Length: 8.9 cm. Increased cortical echogenicity. Atrophic. No hydronephrosis. Cyst in the midpole measuring 4.1 x 2.7 x 4 cm Abdominal aorta: Obscured by bowel gas Other findings: Small amount of ascites in the upper abdomen IMPRESSION: 1. Gallstones with slightly thickened wall which is a nonspecific finding. Negative sonographic Murphy's. No biliary  dilatation. 2. Enlarged appearing echogenic liver suggesting fatty infiltration 3. Small amount of ascites in the upper abdomen 4. Echogenic atrophic kidneys with no hydronephrosis. 4.1 cm cyst in the left kidney. Electronically Signed   By: Jasmine Pang M.D.   On: 01/20/2017 01:55   Ct Hip Left Wo Contrast  Result Date: 01/19/2017 CLINICAL DATA:  Status post trip and fall today with a left hip fracture. Initial encounter. EXAM: CT OF THE LEFT HIP WITHOUT CONTRAST TECHNIQUE: Multidetector CT imaging of the left hip was performed according to the standard protocol. Multiplanar CT image reconstructions were also generated. COMPARISON:  Plain films left hip earlier today. FINDINGS: Bones/Joint/Cartilage As seen on the comparison plain films, the patient has a nondisplaced fracture of the left hip extending from the base of the femoral neck superiorly along the intertrochanteric ridge to the lesser trochanter. The left hip is located. No other fracture is identified. Mild degenerative changes seen about the left hip Ligaments Suboptimally assessed by CT. Muscles and Tendons Intact. Soft tissues There is a large volume of pelvic ascites. Extensive iliac  atherosclerosis is identified. Calcified uterine fibroids are noted. IMPRESSION: Acute nondisplaced left hip fracture extends from the base the femoral neck superiorly along the trochanteric region of the lesser trochanter. No other acute bony abnormality. Large volume of pelvic ascites. Extensive atherosclerosis. Electronically Signed   By: Drusilla Kanner M.D.   On: 01/19/2017 15:48   Dg C-arm 1-60 Min  Result Date: 01/20/2017 CLINICAL DATA:  Left intramedullary nail. EXAM: OPERATIVE LEFT HIP (WITH PELVIS IF PERFORMED) 2 VIEWS TECHNIQUE: Fluoroscopic spot image(s) were submitted for interpretation post-operatively. COMPARISON:  Radiography from yesterday FINDINGS: Fluoroscopy showing proximal femur fracture reduction and internal fixation. No unexpected finding. IMPRESSION: Fluoroscopy for left femur fracture ORIF.  No unexpected finding. Electronically Signed   By: Marnee Spring M.D.   On: 01/20/2017 16:40   Dg Hip Operative Unilat W Or W/o Pelvis Left  Result Date: 01/20/2017 CLINICAL DATA:  Left intramedullary nail. EXAM: OPERATIVE LEFT HIP (WITH PELVIS IF PERFORMED) 2 VIEWS TECHNIQUE: Fluoroscopic spot image(s) were submitted for interpretation post-operatively. COMPARISON:  Radiography from yesterday FINDINGS: Fluoroscopy showing proximal femur fracture reduction and internal fixation. No unexpected finding. IMPRESSION: Fluoroscopy for left femur fracture ORIF.  No unexpected finding. Electronically Signed   By: Marnee Spring M.D.   On: 01/20/2017 16:40   Dg Hip Unilat With Pelvis 2-3 Views Left  Result Date: 01/19/2017 CLINICAL DATA:  Fall.  Left hip pain. EXAM: DG HIP (WITH OR WITHOUT PELVIS) 2-3V LEFT COMPARISON:  No recent prior . FINDINGS: Diffuse osteopenia degenerative change. Fracture of the base of the left femoral neck appears to be present. No prominent displacement. Aortoiliac atherosclerotic vascular calcification. IMPRESSION: 1. Nondisplaced fracture of the base of the left femoral  neck appears to be present. 2. Severe osteopenia. Degenerative changes lumbar spine and both hips. 3. Aortoiliac atherosclerotic vascular disease. Electronically Signed   By: Maisie Fus  Register   On: 01/19/2017 12:49   Dg Knee 3 Views Left  Result Date: 01/19/2017 CLINICAL DATA:  Closed left hip fracture. Clinical concern for a knee fracture. EXAM: LEFT KNEE - 3 VIEW COMPARISON:  None. FINDINGS: Diffuse osteopenia. Marked medial and lateral joint space narrowing. Mild patellofemoral spur formation. Moderate-sized effusion. Anterior and medial soft tissue swelling. No fracture or dislocation visualized on limited views due to limited ability of the patient to cooperate for positioning due to the hip fracture. Dense atheromatous arterial calcifications. IMPRESSION: Limited examination demonstrating a moderate-sized  effusion, tricompartmental degenerative changes and no visible fracture. If there is a continued clinical concern for the possibility of a knee fracture, a knee CT without contrast would be recommended since the patient was unable to cooperate fully for positioning on the radiographs. Electronically Signed   By: Beckie Salts M.D.   On: 01/19/2017 15:31    2D ECHO: Study Conclusions  - Left ventricle: The cavity size was normal. There was severe   concentric hypertrophy. Systolic function was normal. The   estimated ejection fraction was in the range of 55% to 60%. Wall   motion was normal; there were no regional wall motion   abnormalities. The study is not technically sufficient to allow   evaluation of LV diastolic function. - Aortic valve: calcified aortic valve with moderate to severe   stenosis. Mean gradient (S): 34 mm Hg. Peak gradient (S): 61 mm   Hg. Valve area (VTI): 0.7 cm^2. Valve area (Vmax): 0.73 cm^2.   Valve area (Vmean): 0.69 cm^2. - Ascending aorta: The ascending aorta is mildly dilated at 3.9 cm. - Mitral valve: Calcified annulus. Mildly thickened leaflets .   There  was trivial regurgitation. - Left atrium: Severely dilated. - Right ventricle: The cavity size was mildly dilated. Pacer wire   or catheter noted in right ventricle. - Right atrium: Severely dilated. Pacer wire or catheter noted in   right atrium. - Tricuspid valve: There was moderate regurgitation. - Pulmonary arteries: PA peak pressure: 61 mm Hg (S). - Inferior vena cava: The vessel was dilated. The respirophasic   diameter changes were blunted (< 50%), consistent with elevated   central venous pressure.  Impressions:  - LVEF 55-60, severe LVH, moderate to severe aortic stenosis,   mildly dilated ascending aorta to 3.9 cm, trivial MR, severe   biatrial enlargement, pacemaker wires noted, moderate TR, RVSP 61   mmHg, dilated IVC.  Disposition and Follow-up: Discharge Instructions    Diet Carb Modified    Complete by:  As directed    Increase activity slowly    Complete by:  As directed    Weight bearing as tolerated    Complete by:  As directed        DISPOSITION: Skilled nursing facility   DISCHARGE FOLLOW-UP  Contact information for follow-up providers    Tarry Kos, MD Follow up in 2 week(s).   Specialty:  Orthopedic Surgery Why:  For suture removal, For wound re-check Contact information: [address] [place] [address] [phone]            Contact information for after-discharge care    Destination    [facility] Follow up.   Specialty:  Skilled Nursing Facility Contact information: [address] [place] [address] [phone]                   Time spent on Discharge: 35 minutes  Signed:   Thad Ranger M.D. [facility] 01/26/2017, 11:13 AM Pager: [phone]

## 2017-01-27 LAB — BPAM RBC
BLOOD PRODUCT EXPIRATION DATE: 201806042359
Blood Product Expiration Date: 201806042359
ISSUE DATE / TIME: 201805081404
UNIT TYPE AND RH: 5100
Unit Type and Rh: 5100

## 2017-01-27 LAB — TYPE AND SCREEN
ABO/RH(D): A POS
Antibody Screen: POSITIVE
Unit division: 0
Unit division: 0

## 2017-01-27 LAB — URINE CULTURE

## 2017-02-14 ENCOUNTER — Ambulatory Visit (INDEPENDENT_AMBULATORY_CARE_PROVIDER_SITE_OTHER): Payer: No Typology Code available for payment source

## 2017-02-14 ENCOUNTER — Encounter (INDEPENDENT_AMBULATORY_CARE_PROVIDER_SITE_OTHER): Payer: Self-pay | Admitting: Orthopaedic Surgery

## 2017-02-14 ENCOUNTER — Ambulatory Visit (INDEPENDENT_AMBULATORY_CARE_PROVIDER_SITE_OTHER): Payer: Medicare Other | Admitting: Orthopaedic Surgery

## 2017-02-14 DIAGNOSIS — S72145A Nondisplaced intertrochanteric fracture of left femur, initial encounter for closed fracture: Secondary | ICD-10-CM

## 2017-02-14 NOTE — Progress Notes (Signed)
Mrs. Lindsey Sutton is 3 weeks status post age medullary fixation of a nondisplaced left intertrochanteric hip fracture. She is doing well from a hip standpoint. She denies any pain. She physical therapy. She is ambulating with a walker. Her incisions have healed without any signs of infection. Her x-rays show stable fixation and alignment. She is doing well from my standpoint. The staples were removed. I'll see her back in 4 weeks. No x-rays needed and she is having issues.

## 2017-03-06 ENCOUNTER — Telehealth (INDEPENDENT_AMBULATORY_CARE_PROVIDER_SITE_OTHER): Payer: Self-pay | Admitting: Orthopaedic Surgery

## 2017-03-06 NOTE — Telephone Encounter (Signed)
Lindsey Sutton [facility] needing verbal orders for [facility] plan of care for therapy 3 wk 3 and 2 wk 1. The number to contact Alycia RossettiRyan is [phone]

## 2017-03-06 NOTE — Telephone Encounter (Signed)
Verbal order given  

## 2017-03-14 ENCOUNTER — Ambulatory Visit (INDEPENDENT_AMBULATORY_CARE_PROVIDER_SITE_OTHER): Payer: Medicare Other | Admitting: Orthopaedic Surgery

## 2017-03-14 DIAGNOSIS — S72145A Nondisplaced intertrochanteric fracture of left femur, initial encounter for closed fracture: Secondary | ICD-10-CM

## 2017-03-14 NOTE — Progress Notes (Signed)
Lindsey Sutton is a 53 days for intramedullary fixation of a nondisplaced left intertrochanteric femur fracture. She is doing well. She is walking with a walker and working with home physical therapy. Her pain is minimal. Her exam is stable and benign. She is progressing appropriately from my standpoint. I'll like to see her back in 6 weeks with repeat 2 view x-rays of the left hip. Anticipate releasing her at that time.

## 2017-04-20 ENCOUNTER — Emergency Department (HOSPITAL_COMMUNITY): Payer: Medicare Other

## 2017-04-20 ENCOUNTER — Encounter (HOSPITAL_COMMUNITY): Payer: Self-pay

## 2017-04-20 ENCOUNTER — Inpatient Hospital Stay (HOSPITAL_COMMUNITY)
Admission: EM | Admit: 2017-04-20 | Discharge: 2017-04-25 | DRG: 070 | Disposition: A | Discharge status: Skilled Nursing Facility | Payer: Medicare Other | Attending: Family Medicine | Admitting: Family Medicine

## 2017-04-20 DIAGNOSIS — D631 Anemia in chronic kidney disease: Secondary | ICD-10-CM | POA: Diagnosis present

## 2017-04-20 DIAGNOSIS — Y92009 Unspecified place in unspecified non-institutional (private) residence as the place of occurrence of the external cause: Secondary | ICD-10-CM | POA: Diagnosis not present

## 2017-04-20 DIAGNOSIS — R188 Other ascites: Secondary | ICD-10-CM | POA: Diagnosis present

## 2017-04-20 DIAGNOSIS — E1129 Type 2 diabetes mellitus with other diabetic kidney complication: Secondary | ICD-10-CM | POA: Diagnosis present

## 2017-04-20 DIAGNOSIS — R5381 Other malaise: Secondary | ICD-10-CM | POA: Diagnosis present

## 2017-04-20 DIAGNOSIS — Z7951 Long term (current) use of inhaled steroids: Secondary | ICD-10-CM

## 2017-04-20 DIAGNOSIS — Z7984 Long term (current) use of oral hypoglycemic drugs: Secondary | ICD-10-CM

## 2017-04-20 DIAGNOSIS — R2681 Unsteadiness on feet: Secondary | ICD-10-CM | POA: Diagnosis not present

## 2017-04-20 DIAGNOSIS — Z6833 Body mass index (BMI) 33.0-33.9, adult: Secondary | ICD-10-CM

## 2017-04-20 DIAGNOSIS — I482 Chronic atrial fibrillation: Secondary | ICD-10-CM | POA: Diagnosis present

## 2017-04-20 DIAGNOSIS — I132 Hypertensive heart and chronic kidney disease with heart failure and with stage 5 chronic kidney disease, or end stage renal disease: Secondary | ICD-10-CM | POA: Diagnosis present

## 2017-04-20 DIAGNOSIS — I480 Paroxysmal atrial fibrillation: Secondary | ICD-10-CM

## 2017-04-20 DIAGNOSIS — S0083XA Contusion of other part of head, initial encounter: Secondary | ICD-10-CM | POA: Diagnosis present

## 2017-04-20 DIAGNOSIS — S0012XA Contusion of left eyelid and periocular area, initial encounter: Secondary | ICD-10-CM | POA: Diagnosis present

## 2017-04-20 DIAGNOSIS — M25562 Pain in left knee: Secondary | ICD-10-CM | POA: Diagnosis present

## 2017-04-20 DIAGNOSIS — E1122 Type 2 diabetes mellitus with diabetic chronic kidney disease: Secondary | ICD-10-CM | POA: Diagnosis present

## 2017-04-20 DIAGNOSIS — Z9119 Patient's noncompliance with other medical treatment and regimen: Secondary | ICD-10-CM | POA: Diagnosis not present

## 2017-04-20 DIAGNOSIS — Z9114 Patient's other noncompliance with medication regimen: Secondary | ICD-10-CM | POA: Diagnosis not present

## 2017-04-20 DIAGNOSIS — E871 Hypo-osmolality and hyponatremia: Secondary | ICD-10-CM | POA: Diagnosis present

## 2017-04-20 DIAGNOSIS — Z950 Presence of cardiac pacemaker: Secondary | ICD-10-CM | POA: Diagnosis not present

## 2017-04-20 DIAGNOSIS — I082 Rheumatic disorders of both aortic and tricuspid valves: Secondary | ICD-10-CM | POA: Diagnosis present

## 2017-04-20 DIAGNOSIS — W19XXXA Unspecified fall, initial encounter: Secondary | ICD-10-CM

## 2017-04-20 DIAGNOSIS — R2689 Other abnormalities of gait and mobility: Secondary | ICD-10-CM | POA: Diagnosis present

## 2017-04-20 DIAGNOSIS — G9341 Metabolic encephalopathy: Principal | ICD-10-CM | POA: Diagnosis present

## 2017-04-20 DIAGNOSIS — S8392XA Sprain of unspecified site of left knee, initial encounter: Secondary | ICD-10-CM | POA: Diagnosis present

## 2017-04-20 DIAGNOSIS — K760 Fatty (change of) liver, not elsewhere classified: Secondary | ICD-10-CM | POA: Diagnosis present

## 2017-04-20 DIAGNOSIS — Z87891 Personal history of nicotine dependence: Secondary | ICD-10-CM

## 2017-04-20 DIAGNOSIS — Z833 Family history of diabetes mellitus: Secondary | ICD-10-CM

## 2017-04-20 DIAGNOSIS — I4891 Unspecified atrial fibrillation: Secondary | ICD-10-CM | POA: Diagnosis present

## 2017-04-20 DIAGNOSIS — Z992 Dependence on renal dialysis: Secondary | ICD-10-CM | POA: Diagnosis not present

## 2017-04-20 DIAGNOSIS — N189 Chronic kidney disease, unspecified: Secondary | ICD-10-CM

## 2017-04-20 DIAGNOSIS — N2581 Secondary hyperparathyroidism of renal origin: Secondary | ICD-10-CM | POA: Diagnosis present

## 2017-04-20 DIAGNOSIS — S8391XA Sprain of unspecified site of right knee, initial encounter: Secondary | ICD-10-CM

## 2017-04-20 DIAGNOSIS — I350 Nonrheumatic aortic (valve) stenosis: Secondary | ICD-10-CM | POA: Diagnosis not present

## 2017-04-20 DIAGNOSIS — W06XXXA Fall from bed, initial encounter: Secondary | ICD-10-CM | POA: Diagnosis present

## 2017-04-20 DIAGNOSIS — Z9111 Patient's noncompliance with dietary regimen: Secondary | ICD-10-CM | POA: Diagnosis not present

## 2017-04-20 DIAGNOSIS — G8929 Other chronic pain: Secondary | ICD-10-CM | POA: Diagnosis present

## 2017-04-20 DIAGNOSIS — R296 Repeated falls: Secondary | ICD-10-CM | POA: Diagnosis present

## 2017-04-20 DIAGNOSIS — Z7901 Long term (current) use of anticoagulants: Secondary | ICD-10-CM

## 2017-04-20 DIAGNOSIS — D696 Thrombocytopenia, unspecified: Secondary | ICD-10-CM | POA: Diagnosis not present

## 2017-04-20 DIAGNOSIS — M1990 Unspecified osteoarthritis, unspecified site: Secondary | ICD-10-CM | POA: Diagnosis present

## 2017-04-20 DIAGNOSIS — I10 Essential (primary) hypertension: Secondary | ICD-10-CM | POA: Diagnosis present

## 2017-04-20 DIAGNOSIS — N186 End stage renal disease: Secondary | ICD-10-CM | POA: Diagnosis present

## 2017-04-20 DIAGNOSIS — Z9115 Patient's noncompliance with renal dialysis: Secondary | ICD-10-CM | POA: Diagnosis not present

## 2017-04-20 DIAGNOSIS — Z515 Encounter for palliative care: Secondary | ICD-10-CM | POA: Diagnosis not present

## 2017-04-20 DIAGNOSIS — E872 Acidosis: Secondary | ICD-10-CM | POA: Diagnosis present

## 2017-04-20 DIAGNOSIS — I5022 Chronic systolic (congestive) heart failure: Secondary | ICD-10-CM | POA: Diagnosis present

## 2017-04-20 DIAGNOSIS — Z96642 Presence of left artificial hip joint: Secondary | ICD-10-CM | POA: Diagnosis present

## 2017-04-20 DIAGNOSIS — Z880 Allergy status to penicillin: Secondary | ICD-10-CM

## 2017-04-20 DIAGNOSIS — N19 Unspecified kidney failure: Secondary | ICD-10-CM

## 2017-04-20 DIAGNOSIS — F329 Major depressive disorder, single episode, unspecified: Secondary | ICD-10-CM | POA: Diagnosis present

## 2017-04-20 DIAGNOSIS — F1120 Opioid dependence, uncomplicated: Secondary | ICD-10-CM | POA: Diagnosis present

## 2017-04-20 DIAGNOSIS — E669 Obesity, unspecified: Secondary | ICD-10-CM | POA: Diagnosis present

## 2017-04-20 DIAGNOSIS — I5020 Unspecified systolic (congestive) heart failure: Secondary | ICD-10-CM | POA: Diagnosis present

## 2017-04-20 DIAGNOSIS — Z8249 Family history of ischemic heart disease and other diseases of the circulatory system: Secondary | ICD-10-CM

## 2017-04-20 DIAGNOSIS — G9349 Other encephalopathy: Secondary | ICD-10-CM

## 2017-04-20 DIAGNOSIS — J449 Chronic obstructive pulmonary disease, unspecified: Secondary | ICD-10-CM | POA: Diagnosis present

## 2017-04-20 HISTORY — DX: Major depressive disorder, single episode, unspecified: F32.9

## 2017-04-20 HISTORY — DX: End stage renal disease: N18.6

## 2017-04-20 HISTORY — DX: Cardiac murmur, unspecified: R01.1

## 2017-04-20 HISTORY — DX: Type 2 diabetes mellitus without complications: E11.9

## 2017-04-20 HISTORY — DX: Anxiety disorder, unspecified: F41.9

## 2017-04-20 HISTORY — DX: Dependence on renal dialysis: Z99.2

## 2017-04-20 HISTORY — DX: Unspecified osteoarthritis, unspecified site: M19.90

## 2017-04-20 HISTORY — DX: Personal history of other medical treatment: Z92.89

## 2017-04-20 HISTORY — DX: Depression, unspecified: F32.A

## 2017-04-20 HISTORY — DX: Chronic obstructive pulmonary disease, unspecified: J44.9

## 2017-04-20 LAB — CBC WITH DIFFERENTIAL/PLATELET
BASOS PCT: 0 %
Basophils Absolute: 0 10*3/uL (ref 0.0–0.1)
EOS ABS: 0.2 10*3/uL (ref 0.0–0.7)
EOS PCT: 5 %
HCT: 29.3 % — ABNORMAL LOW (ref 36.0–46.0)
HEMOGLOBIN: 9.6 g/dL — AB (ref 12.0–15.0)
LYMPHS ABS: 0.8 10*3/uL (ref 0.7–4.0)
Lymphocytes Relative: 19 %
MCH: 30.5 pg (ref 26.0–34.0)
MCHC: 32.8 g/dL (ref 30.0–36.0)
MCV: 93 fL (ref 78.0–100.0)
MONO ABS: 0.2 10*3/uL (ref 0.1–1.0)
MONOS PCT: 5 %
Neutro Abs: 3 10*3/uL (ref 1.7–7.7)
Neutrophils Relative %: 71 %
PLATELETS: 138 10*3/uL — AB (ref 150–400)
RBC: 3.15 MIL/uL — ABNORMAL LOW (ref 3.87–5.11)
RDW: 16.3 % — AB (ref 11.5–15.5)
WBC: 4.3 10*3/uL (ref 4.0–10.5)

## 2017-04-20 LAB — COMPREHENSIVE METABOLIC PANEL
ALBUMIN: 3.7 g/dL (ref 3.5–5.0)
ALK PHOS: 62 U/L (ref 38–126)
ALT: 7 U/L — AB (ref 14–54)
AST: 13 U/L — ABNORMAL LOW (ref 15–41)
Anion gap: 16 — ABNORMAL HIGH (ref 5–15)
BUN: 82 mg/dL — ABNORMAL HIGH (ref 6–20)
CALCIUM: 9.4 mg/dL (ref 8.9–10.3)
CHLORIDE: 96 mmol/L — AB (ref 101–111)
CO2: 19 mmol/L — AB (ref 22–32)
CREATININE: 6.46 mg/dL — AB (ref 0.44–1.00)
GFR calc non Af Amer: 6 mL/min — ABNORMAL LOW (ref >60)
GFR, EST AFRICAN AMERICAN: 7 mL/min — AB (ref >60)
GLUCOSE: 105 mg/dL — AB (ref 65–99)
Potassium: 4.5 mmol/L (ref 3.5–5.1)
Sodium: 131 mmol/L — ABNORMAL LOW (ref 135–145)
Total Bilirubin: 1.1 mg/dL (ref 0.3–1.2)
Total Protein: 6.7 g/dL (ref 6.5–8.1)

## 2017-04-20 LAB — AMMONIA: AMMONIA: 23 umol/L (ref 9–35)

## 2017-04-20 LAB — GLUCOSE, CAPILLARY: GLUCOSE-CAPILLARY: 135 mg/dL — AB (ref 65–99)

## 2017-04-20 LAB — TROPONIN I: TROPONIN I: 0.05 ng/mL — AB (ref <0.03)

## 2017-04-20 MED ORDER — PNEUMOCOCCAL VAC POLYVALENT 25 MCG/0.5ML IJ INJ
0.5000 mL | INJECTION | INTRAMUSCULAR | Status: DC
  Filled 2017-04-20: qty 0.5

## 2017-04-20 MED ORDER — INSULIN ASPART 100 UNIT/ML ~~LOC~~ SOLN
0.0000 [IU] | Freq: Three times a day (TID) | SUBCUTANEOUS | Status: DC
  Administered 2017-04-21 – 2017-04-22 (×2): 1 [IU] via SUBCUTANEOUS
  Administered 2017-04-25: 2 [IU] via SUBCUTANEOUS

## 2017-04-20 MED ORDER — POLYETHYLENE GLYCOL 3350 17 G PO PACK
17.0000 g | PACK | Freq: Every day | ORAL | Status: DC
Start: 2017-04-20 — End: 2017-04-25
  Administered 2017-04-21: 17 g via ORAL
  Filled 2017-04-20: qty 1

## 2017-04-20 MED ORDER — ACETAMINOPHEN 650 MG RE SUPP: 650.0000 mg | Freq: Four times a day (QID) | RECTAL | Status: DC | PRN

## 2017-04-20 MED ORDER — TRAZODONE HCL 50 MG PO TABS
50.0000 mg | ORAL_TABLET | Freq: Every day | ORAL | Status: DC
  Administered 2017-04-20 – 2017-04-24 (×5): 50 mg via ORAL
  Filled 2017-04-20 (×5): qty 1

## 2017-04-20 MED ORDER — OXYCODONE-ACETAMINOPHEN 5-325 MG PO TABS
1.0000 | ORAL_TABLET | Freq: Four times a day (QID) | ORAL | Status: DC | PRN
  Administered 2017-04-20 – 2017-04-21 (×3): 1 via ORAL
  Filled 2017-04-20 (×3): qty 1

## 2017-04-20 MED ORDER — SENNOSIDES-DOCUSATE SODIUM 8.6-50 MG PO TABS
1.0000 | ORAL_TABLET | Freq: Two times a day (BID) | ORAL | Status: DC
  Administered 2017-04-20 – 2017-04-25 (×5): 1 via ORAL
  Filled 2017-04-20 (×6): qty 1

## 2017-04-20 MED ORDER — CINACALCET HCL 30 MG PO TABS: 30.0000 mg | ORAL_TABLET | Freq: Every day | ORAL | Status: DC

## 2017-04-20 MED ORDER — ALBUTEROL SULFATE (2.5 MG/3ML) 0.083% IN NEBU: 2.5000 mg | INHALATION_SOLUTION | RESPIRATORY_TRACT | Status: DC | PRN

## 2017-04-20 MED ORDER — FOLIC ACID 1 MG PO TABS
1.0000 mg | ORAL_TABLET | Freq: Two times a day (BID) | ORAL | Status: DC
  Administered 2017-04-20 – 2017-04-25 (×10): 1 mg via ORAL
  Filled 2017-04-20 (×10): qty 1

## 2017-04-20 MED ORDER — ACETAMINOPHEN 325 MG PO TABS: 650.0000 mg | ORAL_TABLET | Freq: Four times a day (QID) | ORAL | Status: DC | PRN

## 2017-04-20 MED ORDER — SEVELAMER CARBONATE 800 MG PO TABS
800.0000 mg | ORAL_TABLET | Freq: Three times a day (TID) | ORAL | Status: DC
  Administered 2017-04-21 – 2017-04-25 (×8): 800 mg via ORAL
  Filled 2017-04-20 (×8): qty 1

## 2017-04-20 MED ORDER — NEPRO/CARBSTEADY PO LIQD
237.0000 mL | Freq: Every day | ORAL | Status: DC
Start: 2017-04-20 — End: 2017-04-25
  Administered 2017-04-21 – 2017-04-23 (×3): 237 mL via ORAL

## 2017-04-20 MED ORDER — METHOCARBAMOL 500 MG PO TABS
500.0000 mg | ORAL_TABLET | Freq: Four times a day (QID) | ORAL | Status: DC | PRN
  Administered 2017-04-20 – 2017-04-25 (×4): 500 mg via ORAL
  Filled 2017-04-20 (×4): qty 1

## 2017-04-20 MED ORDER — PANTOPRAZOLE SODIUM 40 MG PO TBEC
40.0000 mg | DELAYED_RELEASE_TABLET | Freq: Every day | ORAL | Status: DC
  Administered 2017-04-21 – 2017-04-25 (×5): 40 mg via ORAL
  Filled 2017-04-20 (×5): qty 1

## 2017-04-20 MED ORDER — ONDANSETRON HCL 4 MG PO TABS: 4.0000 mg | ORAL_TABLET | Freq: Four times a day (QID) | ORAL | Status: DC | PRN

## 2017-04-20 MED ORDER — ENOXAPARIN SODIUM 30 MG/0.3ML ~~LOC~~ SOLN
30.0000 mg | SUBCUTANEOUS | Status: DC
  Administered 2017-04-20 – 2017-04-24 (×5): 30 mg via SUBCUTANEOUS
  Filled 2017-04-20 (×5): qty 0.3

## 2017-04-20 MED ORDER — MOMETASONE FURO-FORMOTEROL FUM 200-5 MCG/ACT IN AERO
2.0000 | INHALATION_SPRAY | Freq: Two times a day (BID) | RESPIRATORY_TRACT | Status: DC
  Administered 2017-04-20 – 2017-04-24 (×6): 2 via RESPIRATORY_TRACT
  Filled 2017-04-20: qty 8.8

## 2017-04-20 NOTE — ED Notes (Signed)
Admitting MD at the bedside.  

## 2017-04-20 NOTE — ED Provider Notes (Signed)
[facility]-EMERGENCY DEPT Provider Note   CSN: [id number] Arrival date & time: 04/20/17  1212     History   Chief Complaint Chief Complaint  Patient presents with  . Fall  . Knee Pain    HPI Lindsey Sutton is a 71 y.o. female.  Patient is a 71 year old female with past medical history of atrial fibrillation, diabetes, pacemaker, and prior left hip fracture. She presents today for evaluation of left knee pain. She reports falling out of bed this morning she was sleeping. She hit her face on the nightstand and her knee on the floor. She has some bruising to the left jaw, but is most concerned about her left knee and left hip. Is no loss of consciousness. She denies any neck pain, significant jaw pain, or malocclusion of the jaw.  Daughter arrives to the emergency department after the initial exam stating that her mother cannot walk and "is not herself". I am told she did not attend dialysis 2 days ago and her last dialysis session was Saturday. Today is Thursday.   The history is provided by the patient.  Fall  This is a new problem. Episode onset: This morning. The problem occurs constantly. The problem has not changed since onset.The symptoms are aggravated by walking. Nothing relieves the symptoms. She has tried nothing for the symptoms.  Knee Pain      Past Medical History:  Diagnosis Date  . A-fib (HCC)   . Anxiety disorder   . Chronic kidney disease   . Diabetes mellitus without complication (HCC)   . Hip fx (HCC) 01/19/2017  . Iron deficiency anemia   . NAFLD (nonalcoholic fatty liver disease) 4/0/9811  . Obesity (BMI 30-39.9) 01/20/2017  . Pacemaker   . Pulmonary edema   . Secondary hyperparathyroidism of renal origin (HCC) 01/22/2017  . Systolic heart failure (HCC)   . Thrombocytopenia (HCC) 01/21/2017    Patient Active Problem List   Diagnosis Date Noted  . Pancytopenia (HCC) 01/23/2017  . Secondary hyperparathyroidism of renal origin (HCC) 01/22/2017  . Hyperphosphatemia  01/22/2017  . Aortic stenosis, moderate-severe 01/22/2017  . Essential hypertension 01/21/2017  . Hyponatremia 01/21/2017  . NAFLD (nonalcoholic fatty liver disease) 91/47/8295  . Thrombocytopenia (HCC) 01/21/2017  . Obesity (BMI 30-39.9) 01/20/2017  . Hip fx (HCC) 01/19/2017  . A-fib (HCC) 01/19/2017  . ESRD (end stage renal disease) (HCC) 01/19/2017  . Pacemaker   . Iron deficiency anemia   . Systolic heart failure (HCC)   . Ascites   . DM (diabetes mellitus), type 2 with renal complications (HCC)   . Nondisplaced intertrochanteric fracture of left femur, initial encounter for closed fracture [facility])     Past Surgical History:  Procedure Laterality Date  . INTRAMEDULLARY (IM) NAIL INTERTROCHANTERIC Left 01/20/2017   Procedure: INTRAMEDULLARY (IM) NAIL INTERTROCHANTRIC;  Surgeon: Tarry Kos, MD;  Location: [facility] OR;  Service: Orthopedics;  Laterality: Left;  . PACEMAKER IMPLANT      OB History    No data available       Home Medications    Prior to Admission medications   Medication Sig Start Date End Date Taking? Authorizing Provider  acetaminophen (TYLENOL) 650 MG CR tablet Take 650 mg by mouth every 8 (eight) hours as needed for pain.    [provider]  budesonide-formoterol (SYMBICORT) 160-4.5 MCG/ACT inhaler Inhale 2 puffs into the lungs 2 (two) times daily.    [provider]  cinacalcet (SENSIPAR) 30 MG tablet Take 30 mg by mouth daily with  supper.    [provider]  diphenhydrAMINE (SOMINEX) 25 MG tablet Take 25 mg by mouth at bedtime as needed for sleep.    [provider]  enoxaparin (LOVENOX) 30 MG/0.3ML injection Inject 0.3 mLs (30 mg total) into the skin daily. 01/20/17   Tarry KosXu, Naiping M, MD  folic acid (FOLVITE) 1 MG tablet Take 1 mg by mouth 2 (two) times daily.    [provider]  glipiZIDE (GLUCOTROL) 5 MG tablet Take by mouth daily before breakfast.    [provider]  methocarbamol (ROBAXIN) 500 MG tablet  Take 1 tablet (500 mg total) by mouth every 6 (six) hours as needed for muscle spasms. 01/25/17   Rai, Delene Ruffiniipudeep K, MD  Nutritional Supplements (FEEDING SUPPLEMENT, NEPRO CARB STEADY,) LIQD Take 237 mLs by mouth daily at 3 pm. 01/25/17   Rai, Ripudeep K, MD  ondansetron (ZOFRAN) 4 MG tablet Take 1 tablet (4 mg total) by mouth every 6 (six) hours as needed for nausea. 01/25/17   Rai, Delene Ruffiniipudeep K, MD  oxyCODONE-acetaminophen (PERCOCET) 5-325 MG tablet Take 1-2 tablets by mouth every 4 (four) hours as needed for severe pain. 01/20/17   Tarry KosXu, Naiping M, MD  pantoprazole (PROTONIX) 40 MG tablet Take 1 tablet (40 mg total) by mouth daily. 01/25/17   Rai, Ripudeep K, MD  polyethylene glycol (MIRALAX / GLYCOLAX) packet Take 17 g by mouth daily. 01/26/17   Rai, Ripudeep K, MD  senna-docusate (SENOKOT-S) 8.6-50 MG tablet Take 1 tablet by mouth 2 (two) times daily. 01/26/17   Rai, Ripudeep Kirtland BouchardK, MD  sevelamer carbonate (RENVELA) 800 MG tablet Take 800 mg by mouth 3 (three) times daily with meals.    [provider]  traZODone (DESYREL) 50 MG tablet Take 50 mg by mouth at bedtime.    [provider]    Family History Family History  Problem Relation Age of Onset  . Diabetes Mother   . Hypertension Father     Social History Social History  Substance Use Topics  . Smoking status: Former Games developermoker  . Smokeless tobacco: Never Used  . Alcohol use No     Allergies   Penicillins   Review of Systems Review of Systems  All other systems reviewed and are negative.    Physical Exam Updated Vital Signs BP 137/71 (BP Location: Right Arm)   Pulse (!) 59   Temp 98.2 F (36.8 C) (Oral)   Resp 16   Ht 5\' 7"  (1.702 m)   Wt 96.6 kg (213 lb)   SpO2 93%   BMI 33.36 kg/m   Physical Exam  Constitutional: She is oriented to person, place, and time. She appears well-developed and well-nourished. No distress.  HENT:  Head: Normocephalic and atraumatic.  There is ecchymosis to the left mandible. There is  no bony tenderness. She has no crepitus and no dental malocclusion.  Eyes: Pupils are equal, round, and reactive to light. EOM are normal.  Neck: Normal range of motion. Neck supple.  Cardiovascular: Normal rate and regular rhythm.  Exam reveals no gallop and no friction rub.   No murmur heard. Pulmonary/Chest: Effort normal and breath sounds normal. No respiratory distress. She has no wheezes.  Abdominal: Soft. Bowel sounds are normal. She exhibits no distension. There is no tenderness.  Musculoskeletal: Normal range of motion.  There is a left knee effusion palpable. She has pain with range of motion, however no significant instability with anterior/posterior or varus or valgus stress. Distal PMS is intact.  Neurological:  She is alert and oriented to person, place, and time. No cranial nerve deficit.  Skin: Skin is warm and dry. She is not diaphoretic.  Nursing note and vitals reviewed.    ED Treatments / Results  Labs (all labs ordered are listed, but only abnormal results are displayed) Labs Reviewed - No data to display  EKG  EKG Interpretation None       Radiology No results found.  Procedures Procedures (including critical care time)  Medications Ordered in ED Medications - No data to display   Initial Impression / Assessment and Plan / ED Course  I have reviewed the triage vital signs and the nursing notes.  Pertinent labs & imaging results that were available during my care of the patient were reviewed by me and considered in my medical decision making (see chart for details).  Workup thus far reveals BUN of 82 with CO2 of 19. 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 This could also be the potential cause of her falls. I've discussed this with Dr. Signe ColtUpton from nephrology who is very familiar with this patient. She is recommending admission for dialysis in the morning.  She also appears to have 2 sprained  knees which will also make it even more difficult for her to ambulate. I do not feel as though this patient will do well at home and believe she should be admitted. As will be discussed with the hospitalist.  Final Clinical Impressions(s) / ED Diagnoses   Final diagnoses:  Fall    New Prescriptions New Prescriptions   No medications on file     Geoffery Lyonselo, Artemisia Auvil, MD 04/20/17 1629

## 2017-04-20 NOTE — ED Notes (Signed)
Attempted report x1. 

## 2017-04-20 NOTE — ED Notes (Signed)
Patient transported to X-ray 

## 2017-04-20 NOTE — ED Triage Notes (Addendum)
Per [facility], pt from home with, pt fell while getting out of bed yesterday, pt felt okay. Pt was getting up to go to dialysis(did not go to dialysis) this morning but complained of right knee pain and knee has swelling. Pt unable to ambulate on it. Pt has some bruising to face from fall. Pt not on blood thinners and did not have LOC. A and O x 4. 115/80, HR 63, RR 20, 91% on RA, CBG 101.

## 2017-04-20 NOTE — Progress Notes (Signed)
New Admission Note:   Arrival Method: stretcher from ED  Mental Orientation: alert and oriented x 4 Telemetry: Box 24 hx of a fib and pacemaker  Assessment: Completed Skin: see flow sheet IV: Left AC NSL  Pain: denies  Tubes: none  Safety Measures: Safety Fall Prevention Plan has been given, discussed and signed Admission: Completed [address] Orientation: Patient has been orientated to the room, unit and staff.  Family: at bedside   Orders have been reviewed and implemented. Will continue to monitor the patient. Call light has been placed within reach and bed alarm has been activated.   Nelma RothmanNatalie Kathaleen Dudziak, RN [facility]  Phone number: [phone]

## 2017-04-20 NOTE — H&P (Signed)
History and Physical  Emberlin Theys ZOX:[id number] DOB: 10-02-1945 DOA: 04/20/2017  Referring physician: Judd Lien, MD PCP: [name], NP   Chief Complaint: Fall / Knee pain  HPI: Lindsey Sutton is a 71 y.o. female with ESRD on HD T, R, Sat, missed several days of treatment apparently has been very weak and not acting herself for last several days.  She had a major fall at home today and injured her knees.  She reportedly fell out of bed this morning while sleeping.  She hit her head/face on nightstand and knees on floor.  She has bruising to face but left knee pain and left hip pain. She does have a history of recent hip replacement.  She denies LOC.  Her daughter says that patient has not been herself lately.  She says that her last HD session was 5 days ago.  Her daughter says that she has been so unsteady on her feet that she can not ambulate safely without falling.  She has no weakness in extremities, no slurred speech, no chest pain and no SOB.  Pt has a long history of noncompliance with renal medications and hemodialysis.  She basically has hemodialysis twice per week and usually misses the other treatments.  There have been times where she misses HD for a full week.  She says that she wants to live but has been dealing with depression from the recent loss of her husband and having been relocated to [facility] from [place].  The primary problem now is that she can't stand and she is too weak to get up from a chair.  This per family is new and they are concerned about this.  She has been in SNF rehab before but has been discharged due to noncompliance with hemodialysis and has been caught throwing medications in the trash per family and patient confirms this.    ED course: Pt was seen and had xrays done to rule out acute fracture.  She had labs done with some electrolyte abnormalities noted and nephrology consultant recommending admission for hemodialysis treatment and evaluation of frequent  falls.   Severity of Illness: The appropriate patient status for this patient is INPATIENT. Inpatient status is judged to be reasonable and necessary in order to provide the required intensity of service to ensure the patient's safety. The patient's presenting symptoms, physical exam findings, and initial radiographic and laboratory data in the context of their chronic comorbidities is felt to place them at high risk for further clinical deterioration. Furthermore, it is not anticipated that the patient will be medically stable for discharge from the hospital within 2 midnights of admission. The following factors support the patient status of inpatient.   " The patient's presenting symptoms include weakness, frequent falls. " The worrisome physical exam findings include bruising, knee effusions. " The initial radiographic and laboratory data are worrisome because of electrolyte abnormalities, metabolic acidosis. " The chronic co-morbidities include ESRD on HD, see below.  * I certify that at the point of admission it is my clinical judgment that the patient will require inpatient hospital care spanning beyond 2 midnights from the point of admission due to high intensity of service, high risk for further deterioration and high frequency of surveillance required.*  Review of Systems: All systems reviewed and apart from history of presenting illness, are negative.  Past Medical History:  Diagnosis Date  . A-fib (HCC)   . Anxiety disorder   . Chronic kidney disease   . Diabetes  mellitus without complication (HCC)   . Hip fx (HCC) 01/19/2017  . Iron deficiency anemia   . NAFLD (nonalcoholic fatty liver disease) 09/24/1094  . Obesity (BMI 30-39.9) 01/20/2017  . Pacemaker   . Pulmonary edema   . Secondary hyperparathyroidism of renal origin (HCC) 01/22/2017  . Systolic heart failure (HCC)   . Thrombocytopenia (HCC) 01/21/2017   Past Surgical History:  Procedure Laterality Date  . INTRAMEDULLARY (IM)  NAIL INTERTROCHANTERIC Left 01/20/2017   Procedure: INTRAMEDULLARY (IM) NAIL INTERTROCHANTRIC;  Surgeon: Tarry Kos, MD;  Location: [facility] OR;  Service: Orthopedics;  Laterality: Left;  . PACEMAKER IMPLANT     Social History:  reports that she has quit smoking. She has never used smokeless tobacco. She reports that she does not drink alcohol or use drugs.  Allergies  Allergen Reactions  . Penicillins     Has patient had a PCN reaction causing immediate rash, facial/tongue/throat swelling, SOB or lightheadedness with hypotension: Yes Has patient had a PCN reaction causing severe rash involving mucus membranes or skin necrosis: No Has patient had a PCN reaction that required hospitalization No Has patient had a PCN reaction occurring within the last 10 years: No If all of the above answers are "NO", then may proceed with Cephalosporin use.     Family History  Problem Relation Age of Onset  . Diabetes Mother   . Hypertension Father     Prior to Admission medications   Medication Sig Start Date End Date Taking? Authorizing Provider  acetaminophen (TYLENOL) 650 MG CR tablet Take 650 mg by mouth every 8 (eight) hours as needed for pain.    [provider]  budesonide-formoterol (SYMBICORT) 160-4.5 MCG/ACT inhaler Inhale 2 puffs into the lungs 2 (two) times daily.    [provider]  cinacalcet (SENSIPAR) 30 MG tablet Take 30 mg by mouth daily with supper.    [provider]  diphenhydrAMINE (SOMINEX) 25 MG tablet Take 25 mg by mouth at bedtime as needed for sleep.    [provider]  enoxaparin (LOVENOX) 30 MG/0.3ML injection Inject 0.3 mLs (30 mg total) into the skin daily. 01/20/17   Tarry Kos, MD  folic acid (FOLVITE) 1 MG tablet Take 1 mg by mouth 2 (two) times daily.    [provider]  glipiZIDE (GLUCOTROL) 5 MG tablet Take by mouth daily before breakfast.    [provider]  methocarbamol (ROBAXIN) 500 MG tablet Take 1 tablet (500  mg total) by mouth every 6 (six) hours as needed for muscle spasms. 01/25/17   Rai, Delene Ruffini, MD  Nutritional Supplements (FEEDING SUPPLEMENT, NEPRO CARB STEADY,) LIQD Take 237 mLs by mouth daily at 3 pm. 01/25/17   Rai, Ripudeep K, MD  ondansetron (ZOFRAN) 4 MG tablet Take 1 tablet (4 mg total) by mouth every 6 (six) hours as needed for nausea. 01/25/17   Rai, Delene Ruffini, MD  oxyCODONE-acetaminophen (PERCOCET) 5-325 MG tablet Take 1-2 tablets by mouth every 4 (four) hours as needed for severe pain. 01/20/17   Tarry Kos, MD  pantoprazole (PROTONIX) 40 MG tablet Take 1 tablet (40 mg total) by mouth daily. 01/25/17   Rai, Ripudeep K, MD  polyethylene glycol (MIRALAX / GLYCOLAX) packet Take 17 g by mouth daily. 01/26/17   Rai, Ripudeep K, MD  senna-docusate (SENOKOT-S) 8.6-50 MG tablet Take 1 tablet by mouth 2 (two) times daily. 01/26/17   Rai, Ripudeep Kirtland Bouchard, MD  sevelamer carbonate (RENVELA) 800 MG tablet Take 800 mg by mouth  3 (three) times daily with meals.    [provider]  traZODone (DESYREL) 50 MG tablet Take 50 mg by mouth at bedtime.    [provider]   Physical Exam: Vitals:   04/20/17 1222 04/20/17 1636  BP: 137/71 130/74  Pulse: (!) 59 62  Resp: 16 16  Temp: 98.2 F (36.8 C)   TempSrc: Oral   SpO2: 93% 100%  Weight: 96.6 kg (213 lb)   Height: 5\' 7"  (1.702 m)     General exam: Moderately built and nourished patient, lying comfortably supine on the gurney in no obvious distress.  Head, eyes and ENT: Ecchymosis to the left mandible. There is no bony tenderness. She has no crepitus and no dental malocclusion. . Pupils equally reacting to light and accommodation. Oral mucosa moist.  Neck: Supple. No JVD, carotid bruit or thyromegaly.  Lymphatics: No lymphadenopathy.  Respiratory system: Clear to auscultation. No increased work of breathing.  Cardiovascular system: S1 and S2 heard, RRR. No JVD, murmurs, gallops, clicks or pedal edema.  Gastrointestinal system:  Abdomen is nondistended, soft and nontender. Normal bowel sounds heard. No organomegaly or masses appreciated.  Central nervous system: Alert and oriented. No focal neurological deficits.  Extremities: left knee effusion palpable. She has pain with range of motion, however no significant instability with anterior/posterior or varus or valgus stress. Symmetric 5 x 5 power. Peripheral pulses symmetrically felt.   Skin: No rashes or acute findings.  Musculoskeletal system: bruising noted on both knees, small left knee effusion. Bilateral swollen feet with neuropathic changes and deformities.  Psychiatry: Pleasant and cooperative.  Labs on Admission:  Basic Metabolic Panel:  Recent Labs Lab 04/20/17 1502  NA 131*  K 4.5  CL 96*  CO2 19*  GLUCOSE 105*  BUN 82*  CREATININE 6.46*  CALCIUM 9.4   Liver Function Tests:  Recent Labs Lab 04/20/17 1502  AST 13*  ALT 7*  ALKPHOS 62  BILITOT 1.1  PROT 6.7  ALBUMIN 3.7   No results for input(s): LIPASE, AMYLASE in the last 168 hours.  Recent Labs Lab 04/20/17 1458  AMMONIA 23   CBC:  Recent Labs Lab 04/20/17 1502  WBC 4.3  NEUTROABS 3.0  HGB 9.6*  HCT 29.3*  MCV 93.0  PLT 138*   Cardiac Enzymes: No results for input(s): CKTOTAL, CKMB, CKMBINDEX, TROPONINI in the last 168 hours.  BNP (last 3 results) No results for input(s): PROBNP in the last 8760 hours. CBG: No results for input(s): GLUCAP in the last 168 hours.  Radiological Exams on Admission: Dg Chest 2 View  Result Date: 04/20/2017 CLINICAL DATA:  Fall.  Dialysis patient EXAM: CHEST  2 VIEW COMPARISON:  Chest radiograph 01/19/2017 FINDINGS: LEFT-sided pacemaker overlies stable enlarged cardiac silhouette. No effusion, infiltrate or pneumothorax. No evidence of fracture. IMPRESSION: Cardiomegaly without acute cardiopulmonary findings. Electronically Signed   By: Genevive BiStewart  Edmunds M.D.   On: 04/20/2017 16:33   Dg Knee Complete 4 Views Left  Result Date:  04/20/2017 CLINICAL DATA:  Knee pain.  Left hip pain EXAM: LEFT KNEE - COMPLETE 4+ VIEW COMPARISON:  01/19/2017 FINDINGS: Moderate joint effusion. Femoral nail with tip at the anterior cortex of the distal femur. No acute fracture or dislocation. Osteopenia and atherosclerotic calcification. IMPRESSION: 1. Joint effusion without acute osseous finding. 2. Femoral nail with tip near the ventral cortex. 3. Osteopenia and atherosclerosis. Electronically Signed   By: Marnee SpringJonathon  Watts M.D.   On: 04/20/2017 14:34   Dg Knee Complete 4 Views Right  Result Date: 04/20/2017 CLINICAL DATA:  Right knee pain since a fall yesterday getting out of bed. EXAM: RIGHT KNEE - COMPLETE 4+ VIEW COMPARISON:  None. FINDINGS: The patient has a large joint effusion. No fracture is identified. Bones are somewhat osteopenic. Moderate degenerative change about the knee is most notable in the patellofemoral and lateral compartments. Extensive atherosclerosis is noted. IMPRESSION: Large joint effusion.  No fracture is identified. Osteopenia. Patellofemoral and lateral compartment osteoarthritis. Extensive atherosclerosis. Electronically Signed   By: Drusilla Kannerhomas  Dalessio M.D.   On: 04/20/2017 16:28   Dg Hip Unilat With Pelvis 2-3 Views Left  Result Date: 04/20/2017 CLINICAL DATA:  Left hip pain after recent fall. EXAM: DG HIP (WITH OR WITHOUT PELVIS) 2-3V LEFT COMPARISON:  Left hip x-rays dated Feb 14, 2017. FINDINGS: Evaluation is limited due to body habitus. Prior left femur cephalomedullary rod fracture fixation with healing of the intertrochanteric fracture. No hardware complication. No new fracture. Mild degenerative changes of the bilateral hip joints. Diffuse osteopenia. Aortoiliac atherosclerotic vascular disease. IMPRESSION: 1. No definite acute osseous abnormality. If there is persistent concern for left hip fracture, consider CT for further evaluation. 2. Healing intertrochanteric fracture status post ORIF. No evidence of hardware  complication. Electronically Signed   By: Obie DredgeWilliam T Derry M.D.   On: 04/20/2017 14:37   EKG: Independently reviewed. Ventricular paced.    Assessment/Plan Principal Problem:   Acute metabolic encephalopathy Active Problems:   ESRD (end stage renal disease) (HCC)   Gait instability   Frequent falls   Pacemaker   A-fib (HCC)   Systolic heart failure (HCC)   DM (diabetes mellitus), type 2 with renal complications (HCC)   Essential hypertension   Hyponatremia   NAFLD (nonalcoholic fatty liver disease)   Thrombocytopenia (HCC)   Secondary hyperparathyroidism of renal origin (HCC)   Hyperphosphatemia   Aortic stenosis, moderate-severe   Anemia in chronic kidney disease (CKD)   1. Metabolic encephalopathy - likely secondary to uremia from missed HD treatments, will admit for hemodialysis, consulted nephrology (Dr. Signe ColtUpton) and they are planning for HD in AM.  Monitor electrolytes and telemetry.   2. Metabolic acidosis - likely due to uremia, hemodialysis treatment planned per Dr. Signe ColtUpton.  3. ESRD on HD with long history of noncompliance - Pt admits that she doesn't take her renal meds regularly and does not go to HD regularly.  She will be admitted to have catch up hemodialysis.  Unfortunately given her long history of non compliance it is unlikely that she will change her behavior although she says she wants to live and family is interested in discussing renal transplant.  I explained to them that given her advanced age and well documented medication noncompliance she probably would not be a candidate for renal transplant.   4. Frequent falls, gait instability - This is a complex issue and there could be multiple reasons for this.  For now, will order Fall precautions, get PT/OT evaluation.  5. Chronic pain/opioid dependence - Family requested to be judicious with pain meds as patient has tendency to become very sedated on pain meds.  I re-ordered her home percocet but reduced dose to 1 tablet  every 6 hours PRN.   6. Generalized weakness - multifactorial given anemia, metabolic derangements and chronic comorbidities, will need to follow carefully and treat what can be treated.  Cycle troponin.   7. Liver Cirrhosis - family says that this has been stable over past years, her LFTs appear normal today, will monitor.  Watch tylenol use.  8. DM type 2 - hold oral sulfonylurea in hospital, sensitive sliding scale coverage ordered.  I am concerned about unrecognized hypoglycemia.  I wonder if she would be better served with a safer medication to manage her diabetes outpatient.  For now, will not give the sulfonylurea in the hospital.   9. Anemia of CKD - Hg 9.6, follow closely.   10. Hyponatremia - volume overloaded, should improve with hemodialysis and fluid removal.  11. Thrombocytopenia - follow, no bleeding.   12. AS - moderate - severe -  Pt had Echo in May 2018: LVEF 55-60, severe LVH, mod-sev AS, severe biatrial enlargement, mod TR, dil IVC.  13. Hyperphos - Pt refuses to take her renal medications per family.  They warned that she likely will not take them in the hospital either.   DVT Prophylaxis: lovenox Code Status: full   Family Communication: daughters at bedside  Disposition Plan: TBD   Time spent: 60 mins  Standley Dakins, MD [facility] Pager [phone]  If 7PM-7AM, please contact night-coverage [email or web address] Password [facility] 04/20/2017, 4:54 PM

## 2017-04-20 NOTE — Consult Note (Signed)
Reason for Consult: To manage dialysis and dialysis related needs Referring Physician: Dr. Wynetta Emery, [facility]  Idona Stach is an 71 y.o. female.  HPI: Pt is a 21F with a PMH of ESRD on HD TTS, noncompliance with dialysis, NAFLD, recent L hip fracture s/p IM nail implant, Afib s/p pacemaker, and mod-severe AS who is now seen in consultation at the request of Dr. Wynetta Emery for evaluation and recommendations surrounding management of ESRD and provision of HD.  Pt has missed several sessions of dialysis (last treatment Saturday) and according to dtr wasn't acting right.  She fell on her knees and injured both. Xrays with no fracture but bilateral effusions.  Didn't lose consciousness and didn't hit her head.    She presented to the ED for evaluation.    She reports bilateral knee pain.  She denies CP, SOB, HA, n/v, or other symptoms.    Dialyzes at [facility] TTS EDW 94 kg HD 4 hrs Bath 2K 2.25 Ca, Dialyzer F180 BFR 450 Heparin 5000 u bolus Access L RC AVF. Mircera 200 mcg q 2 weeks (last given 04/04/17) Venofer 50 mg q week (last given 04/06/17) Calcitriol 1.5 mcg TIW  Past Medical History:  Diagnosis Date  . A-fib ([place])   . Anxiety   . Arthritis    "legs" (04/20/2017)  . COPD (chronic obstructive pulmonary disease) ([place])   . Depression   . ESRD (end stage renal disease) on dialysis ([place])    "TTS; [facility]; [place]" (04/20/2017)  . Heart murmur   . History of blood transfusion    related to childbirth  . Iron deficiency anemia   . NAFLD (nonalcoholic fatty liver disease) 01/21/2017  . Obesity (BMI 30-39.9) 01/20/2017  . Pacemaker   . Pulmonary edema   . Secondary hyperparathyroidism of renal origin ([place]) 01/22/2017  . Systolic heart failure ([place])   . Thrombocytopenia ([place]) 01/21/2017  . Type II diabetes mellitus ([place])     Past Surgical History:  Procedure Laterality Date  . AV FISTULA PLACEMENT Left ~ 2003  . AV FISTULA REPAIR Left X 2  . CARDIAC CATHETERIZATION    . CATARACT EXTRACTION  W/ INTRAOCULAR LENS  IMPLANT, BILATERAL Bilateral 2017  . FRACTURE SURGERY    . INSERT / REPLACE / REMOVE PACEMAKER  ~ 2012  . INTRAMEDULLARY (IM) NAIL INTERTROCHANTERIC Left 01/20/2017   Procedure: INTRAMEDULLARY (IM) NAIL INTERTROCHANTRIC;  Surgeon: Leandrew Koyanagi, MD;  Location: [facility];  Service: Orthopedics;  Laterality: Left;    Family History  Problem Relation Age of Onset  . Diabetes Mother   . Hypertension Father     Social History:  reports that she quit smoking about 20 years ago. Her smoking use included Cigarettes. She has a 33.00 pack-year smoking history. She has never used smokeless tobacco. She reports that she does not drink alcohol or use drugs.  Allergies:  Allergies  Allergen Reactions  . Penicillins     Has patient had a PCN reaction causing immediate rash, facial/tongue/throat swelling, SOB or lightheadedness with hypotension: Yes Has patient had a PCN reaction causing severe rash involving mucus membranes or skin necrosis: No Has patient had a PCN reaction that required hospitalization No Has patient had a PCN reaction occurring within the last 10 years: No If all of the above answers are "NO", then may proceed with Cephalosporin use.     Medications:  Scheduled: . cinacalcet  30 mg Oral Q supper  . enoxaparin  30 mg Subcutaneous Q24H  . feeding supplement (NEPRO CARB  STEADY)  237 mL Oral Q1500  . folic acid  1 mg Oral BID  . [START ON 04/21/2017] insulin aspart  0-9 Units Subcutaneous TID WC  . mometasone-formoterol  2 puff Inhalation BID  . pantoprazole  40 mg Oral Daily  . [START ON 04/21/2017] pneumococcal 23 valent vaccine  0.5 mL Intramuscular Tomorrow-1000  . polyethylene glycol  17 g Oral Daily  . senna-docusate  1 tablet Oral BID  . [START ON 04/21/2017] sevelamer carbonate  800 mg Oral TID WC  . traZODone  50 mg Oral QHS     Results for orders placed or performed during the hospital encounter of 04/20/17 (from the past 48 hour(s))  Ammonia      Status: None   Collection Time: 04/20/17  2:58 PM  Result Value Ref Range   Ammonia 23 9 - 35 umol/L  Comprehensive metabolic panel     Status: Abnormal   Collection Time: 04/20/17  3:02 PM  Result Value Ref Range   Sodium 131 (L) 135 - 145 mmol/L   Potassium 4.5 3.5 - 5.1 mmol/L   Chloride 96 (L) 101 - 111 mmol/L   CO2 19 (L) 22 - 32 mmol/L   Glucose, Bld 105 (H) 65 - 99 mg/dL   BUN 82 (H) 6 - 20 mg/dL   Creatinine, Ser 6.46 (H) 0.44 - 1.00 mg/dL   Calcium 9.4 8.9 - 10.3 mg/dL   Total Protein 6.7 6.5 - 8.1 g/dL   Albumin 3.7 3.5 - 5.0 g/dL   AST 13 (L) 15 - 41 U/L   ALT 7 (L) 14 - 54 U/L   Alkaline Phosphatase 62 38 - 126 U/L   Total Bilirubin 1.1 0.3 - 1.2 mg/dL   GFR calc non Af Amer 6 (L) >60 mL/min   GFR calc Af Amer 7 (L) >60 mL/min    Comment: (NOTE) The eGFR has been calculated using the CKD EPI equation. This calculation has not been validated in all clinical situations. eGFR's persistently <60 mL/min signify possible Chronic Kidney Disease.    Anion gap 16 (H) 5 - 15  CBC with Differential     Status: Abnormal   Collection Time: 04/20/17  3:02 PM  Result Value Ref Range   WBC 4.3 4.0 - 10.5 K/uL   RBC 3.15 (L) 3.87 - 5.11 MIL/uL   Hemoglobin 9.6 (L) 12.0 - 15.0 g/dL   HCT 29.3 (L) 36.0 - 46.0 %   MCV 93.0 78.0 - 100.0 fL   MCH 30.5 26.0 - 34.0 pg   MCHC 32.8 30.0 - 36.0 g/dL   RDW 16.3 (H) 11.5 - 15.5 %   Platelets 138 (L) 150 - 400 K/uL   Neutrophils Relative % 71 %   Neutro Abs 3.0 1.7 - 7.7 K/uL   Lymphocytes Relative 19 %   Lymphs Abs 0.8 0.7 - 4.0 K/uL   Monocytes Relative 5 %   Monocytes Absolute 0.2 0.1 - 1.0 K/uL   Eosinophils Relative 5 %   Eosinophils Absolute 0.2 0.0 - 0.7 K/uL   Basophils Relative 0 %   Basophils Absolute 0.0 0.0 - 0.1 K/uL  Troponin I     Status: Abnormal   Collection Time: 04/20/17  3:02 PM  Result Value Ref Range   Troponin I 0.05 (HH) <0.03 ng/mL    Comment: CRITICAL RESULT CALLED TO, READ BACK BY AND VERIFIED  WITH: Eilleen Kempf RN [phone] GREEN R   Glucose, capillary     Status: Abnormal  Collection Time: 04/20/17  9:43 PM  Result Value Ref Range   Glucose-Capillary 135 (H) 65 - 99 mg/dL    Dg Chest 2 View  Result Date: 04/20/2017 CLINICAL DATA:  Fall.  Dialysis patient EXAM: CHEST  2 VIEW COMPARISON:  Chest radiograph 01/19/2017 FINDINGS: LEFT-sided pacemaker overlies stable enlarged cardiac silhouette. No effusion, infiltrate or pneumothorax. No evidence of fracture. IMPRESSION: Cardiomegaly without acute cardiopulmonary findings. Electronically Signed   By: Suzy Bouchard M.D.   On: 04/20/2017 16:33   Dg Knee Complete 4 Views Left  Result Date: 04/20/2017 CLINICAL DATA:  Knee pain.  Left hip pain EXAM: LEFT KNEE - COMPLETE 4+ VIEW COMPARISON:  01/19/2017 FINDINGS: Moderate joint effusion. Femoral nail with tip at the anterior cortex of the distal femur. No acute fracture or dislocation. Osteopenia and atherosclerotic calcification. IMPRESSION: 1. Joint effusion without acute osseous finding. 2. Femoral nail with tip near the ventral cortex. 3. Osteopenia and atherosclerosis. Electronically Signed   By: Monte Fantasia M.D.   On: 04/20/2017 14:34   Dg Knee Complete 4 Views Right  Result Date: 04/20/2017 CLINICAL DATA:  Right knee pain since a fall yesterday getting out of bed. EXAM: RIGHT KNEE - COMPLETE 4+ VIEW COMPARISON:  None. FINDINGS: The patient has a large joint effusion. No fracture is identified. Bones are somewhat osteopenic. Moderate degenerative change about the knee is most notable in the patellofemoral and lateral compartments. Extensive atherosclerosis is noted. IMPRESSION: Large joint effusion.  No fracture is identified. Osteopenia. Patellofemoral and lateral compartment osteoarthritis. Extensive atherosclerosis. Electronically Signed   By: Inge Rise M.D.   On: 04/20/2017 16:28   Dg Hip Unilat With Pelvis 2-3 Views Left  Result Date: 04/20/2017 CLINICAL DATA:  Left hip  pain after recent fall. EXAM: DG HIP (WITH OR WITHOUT PELVIS) 2-3V LEFT COMPARISON:  Left hip x-rays dated Feb 14, 2017. FINDINGS: Evaluation is limited due to body habitus. Prior left femur cephalomedullary rod fracture fixation with healing of the intertrochanteric fracture. No hardware complication. No new fracture. Mild degenerative changes of the bilateral hip joints. Diffuse osteopenia. Aortoiliac atherosclerotic vascular disease. IMPRESSION: 1. No definite acute osseous abnormality. If there is persistent concern for left hip fracture, consider CT for further evaluation. 2. Healing intertrochanteric fracture status post ORIF. No evidence of hardware complication. Electronically Signed   By: Titus Dubin M.D.   On: 04/20/2017 14:37    ROS: all other systems reviewed and are negative except as per HPI Blood pressure (!) 115/59, pulse 62, temperature (!) 97.5 F (36.4 C), temperature source Oral, resp. rate 16, height _0  (1.702 m), weight 96.6 kg (213 lb), SpO2 97 %. . GEN older woman, NAD HEENT EOMI, poor dentition NECK + JVD PULM diminished bibasilar breath sounds CV RRR, harsh crescendo/decrescendo systolic murmur RUSB ABD + ascites, fluid wave, distended EXT trace LE edema NEURO  AAO x 3 SKIN ecchymosis L side of face ACCESS: L RC fistula + T/B    Assessment/Plan: 1 Fall: sounds like a mechanical fall, possibly exacerbated by uremia.  May need SNF placment 2 ESRD: TTS. Provide HD off schedule tomorrow and then regular schedule Saturday.  2K bath. 3 Hypertension: not on any antihypertensives 4. Anemia of ESRD: Last Hgb as an outpatient was 9.2.  Give Aranesp 200 q week next rx 5. Metabolic Bone Disease: not taking sensipar, was getting renvela 1600 TID with meals.  Continue calcitriol. 6.  Mod-severe AS: doesn't appear to have syncopized.   7.  Afib s/p PPM 8.  NAFLD with ascites: has required paracentesis in the past.    Colburn Asper 04/20/2017, 11:43 PM

## 2017-04-21 DIAGNOSIS — Z950 Presence of cardiac pacemaker: Secondary | ICD-10-CM

## 2017-04-21 LAB — RENAL FUNCTION PANEL
Albumin: 3.2 g/dL — ABNORMAL LOW (ref 3.5–5.0)
Anion gap: 14 (ref 5–15)
BUN: 87 mg/dL — AB (ref 6–20)
CHLORIDE: 97 mmol/L — AB (ref 101–111)
CO2: 19 mmol/L — ABNORMAL LOW (ref 22–32)
Calcium: 8.9 mg/dL (ref 8.9–10.3)
Creatinine, Ser: 6.7 mg/dL — ABNORMAL HIGH (ref 0.44–1.00)
GFR calc Af Amer: 6 mL/min — ABNORMAL LOW (ref >60)
GFR calc non Af Amer: 6 mL/min — ABNORMAL LOW (ref >60)
GLUCOSE: 100 mg/dL — AB (ref 65–99)
POTASSIUM: 4.7 mmol/L (ref 3.5–5.1)
Phosphorus: 8.4 mg/dL — ABNORMAL HIGH (ref 2.5–4.6)
Sodium: 130 mmol/L — ABNORMAL LOW (ref 135–145)

## 2017-04-21 LAB — CBC
HEMATOCRIT: 26 % — AB (ref 36.0–46.0)
Hemoglobin: 8.2 g/dL — ABNORMAL LOW (ref 12.0–15.0)
MCH: 29.5 pg (ref 26.0–34.0)
MCHC: 31.5 g/dL (ref 30.0–36.0)
MCV: 93.5 fL (ref 78.0–100.0)
PLATELETS: 129 10*3/uL — AB (ref 150–400)
RBC: 2.78 MIL/uL — ABNORMAL LOW (ref 3.87–5.11)
RDW: 16.4 % — AB (ref 11.5–15.5)
WBC: 3.9 10*3/uL — AB (ref 4.0–10.5)

## 2017-04-21 LAB — TROPONIN I: Troponin I: 0.05 ng/mL (ref <0.03)

## 2017-04-21 LAB — GLUCOSE, CAPILLARY
GLUCOSE-CAPILLARY: 124 mg/dL — AB (ref 65–99)
Glucose-Capillary: 126 mg/dL — ABNORMAL HIGH (ref 65–99)

## 2017-04-21 LAB — MRSA PCR SCREENING: MRSA by PCR: NEGATIVE

## 2017-04-21 LAB — MAGNESIUM: Magnesium: 2 mg/dL (ref 1.7–2.4)

## 2017-04-21 MED ORDER — PENTAFLUOROPROP-TETRAFLUOROETH EX AERO: 1.0000 "application " | INHALATION_SPRAY | CUTANEOUS | Status: DC | PRN

## 2017-04-21 MED ORDER — OXYCODONE-ACETAMINOPHEN 5-325 MG PO TABS
1.0000 | ORAL_TABLET | Freq: Four times a day (QID) | ORAL | Status: DC | PRN
  Administered 2017-04-21 – 2017-04-22 (×3): 2 via ORAL
  Administered 2017-04-23: 1 via ORAL
  Administered 2017-04-23 – 2017-04-24 (×2): 2 via ORAL
  Administered 2017-04-25: 1 via ORAL
  Filled 2017-04-21 (×4): qty 2
  Filled 2017-04-21: qty 1
  Filled 2017-04-21: qty 2
  Filled 2017-04-21: qty 1

## 2017-04-21 MED ORDER — LIDOCAINE HCL (PF) 1 % IJ SOLN: 5.0000 mL | INTRAMUSCULAR | Status: DC | PRN

## 2017-04-21 MED ORDER — SODIUM CHLORIDE 0.9 % IV SOLN: 100.0000 mL | INTRAVENOUS | Status: DC | PRN

## 2017-04-21 MED ORDER — ALTEPLASE 2 MG IJ SOLR: 2.0000 mg | Freq: Once | INTRAMUSCULAR | Status: DC | PRN

## 2017-04-21 MED ORDER — SODIUM CHLORIDE 0.9 % IV SOLN
62.5000 mg | INTRAVENOUS | Status: DC
  Administered 2017-04-22: 62.5 mg via INTRAVENOUS
  Filled 2017-04-21 (×3): qty 5

## 2017-04-21 MED ORDER — DARBEPOETIN ALFA 200 MCG/0.4ML IJ SOSY
PREFILLED_SYRINGE | INTRAMUSCULAR | Status: AC
  Filled 2017-04-21: qty 0.4

## 2017-04-21 MED ORDER — LIDOCAINE-PRILOCAINE 2.5-2.5 % EX CREA: 1.0000 "application " | TOPICAL_CREAM | CUTANEOUS | Status: DC | PRN

## 2017-04-21 MED ORDER — CALCITRIOL 0.5 MCG PO CAPS
1.5000 ug | ORAL_CAPSULE | Freq: Once | ORAL | Status: AC
  Administered 2017-04-25: 1.5 ug via ORAL

## 2017-04-21 MED ORDER — CALCITRIOL 0.5 MCG PO CAPS
1.5000 ug | ORAL_CAPSULE | ORAL | Status: DC
  Administered 2017-04-22: 1.5 ug via ORAL
  Filled 2017-04-21: qty 3

## 2017-04-21 MED ORDER — HEPARIN SODIUM (PORCINE) 1000 UNIT/ML DIALYSIS: 1000.0000 [IU] | INTRAMUSCULAR | Status: DC | PRN

## 2017-04-21 MED ORDER — DARBEPOETIN ALFA 200 MCG/0.4ML IJ SOSY
200.0000 ug | PREFILLED_SYRINGE | INTRAMUSCULAR | Status: DC
  Filled 2017-04-21: qty 0.4

## 2017-04-21 NOTE — Procedures (Signed)
Patient seen on Hemodialysis. QB 400, UF goal 4L Treatment adjusted as needed.  Zetta BillsJay Roseann Kees MD [facility]. Office # [phone] Pager # [phone] 10:23 AM

## 2017-04-21 NOTE — Evaluation (Signed)
Physical Therapy Evaluation Patient Details Name: Lindsey Sutton MRN: [id number] DOB: 11/30/1945 Today's Date: 04/21/2017   History of Present Illness  Pt is a 71 yo female admitted through ED on 04/20/17 following multiple falls at home and inability to get up from a seated position without Max A. Pt missed several days of dialysis and developed significant weakness. Pt fell out of the bed resulting in bruising on chin and L shoulder and fell in the shower with bruising in her back. Pt was diagnosed with metabolic encephalopathy, metabolic acidosis due to toxicity. PMH significant for ESRDon HD, A-fib, CKD, DM2, hip fx 01/19/17 with ORIF with IM nail on L, NAFLD, obesity, pacemaker, sysolic CHF, thrombocytopenia.   Clinical Impression  Pt presents with the above diagnosis and below deficits for therapy evaluation. Prior to admission, pt was living with her daughter in a single level home and was mostly independent with ADLs and IADLs. Daughter assisted with meal prep and transportation to dialysis. Pt requires Mod-Max A for bed mobility this session and was unable to achieve standing up at EOB. Pt will benefit from continued acute PT services in order to address the below deficits prior to discharge to venue recommended below.     Follow Up Recommendations SNF;Supervision/Assistance - 24 hour    Equipment Recommendations  None recommended by PT    Recommendations for Other Services       Precautions / Restrictions Precautions Precautions: Fall Precaution Comments: multiple falls prior to admission Restrictions Weight Bearing Restrictions: No      Mobility  Bed Mobility Overal bed mobility: Needs Assistance Bed Mobility: Supine to Sit;Sit to Supine     Supine to sit: Max assist Sit to supine: Mod assist   General bed mobility comments: Max A to sit up at EOB with use of pad scoot bottom toward EOB. Pt able to sit EOB x 5 minutes without any assistance. Required Mod A to bring LE's into  bed and adjust self up in bed.   Transfers Overall transfer level: Needs assistance Equipment used: Rolling walker (2 wheeled) Transfers: Sit to/from Stand Sit to Stand: Total assist         General transfer comment: unable to stand safely from EOB without possibly total A. Pt unable to achieve standing to change pad.   Ambulation/Gait             General Gait Details: NT  Stairs            Wheelchair Mobility    Modified Rankin (Stroke Patients Only)       Balance Overall balance assessment: Needs assistance Sitting-balance support: No upper extremity supported;Feet supported Sitting balance-Leahy Scale: Good                                       Pertinent Vitals/Pain Pain Assessment: Faces Faces Pain Scale: Hurts even more Pain Location: bilateral LE's  Pain Descriptors / Indicators: Grimacing;Guarding Pain Intervention(s): Monitored during session;Premedicated before session;Repositioned    Home Living Family/patient expects to be discharged to:: Private residence Living Arrangements: Children Available Help at Discharge: Family;Available 24 hours/day Type of Home: House Home Access: Level entry     Home Layout: One level Home Equipment: Walker - 2 wheels;Walker - 4 wheels;Bedside commode;Shower seat;Shower seat - built in;Wheelchair - manual;Hand held shower head;Grab bars - toilet;Grab bars - tub/shower Additional Comments: home is set up to be handicapped accessible including  widened doorways.     Prior Function Level of Independence: Independent with assistive device(s)         Comments: Was using rollator for short distance and manual wheelchair for long-distance mobility     Hand Dominance   Dominant Hand: Right    Extremity/Trunk Assessment   Upper Extremity Assessment Upper Extremity Assessment: Defer to OT evaluation    Lower Extremity Assessment Lower Extremity Assessment: Generalized weakness     Cervical / Trunk Assessment Cervical / Trunk Assessment: Kyphotic  Communication   Communication: No difficulties  Cognition Arousal/Alertness: Awake/alert Behavior During Therapy: WFL for tasks assessed/performed Overall Cognitive Status: Within Functional Limits for tasks assessed                                        General Comments      Exercises     Assessment/Plan    PT Assessment Patient needs continued PT services  PT Problem List Decreased strength;Decreased activity tolerance;Decreased balance;Decreased mobility;Decreased knowledge of use of DME;Pain;Decreased range of motion       PT Treatment Interventions DME instruction;Gait training;Stair training;Therapeutic activities;Functional mobility training;Therapeutic exercise;Balance training;Patient/family education    PT Goals (Current goals can be found in the Care Plan section)  Acute Rehab PT Goals Patient Stated Goal: to be able to get up again PT Goal Formulation: With patient/family Time For Goal Achievement: 05/05/17 Potential to Achieve Goals: Good    Frequency Min 2X/week   Barriers to discharge        Co-evaluation               AM-PAC PT "6 Clicks" Daily Activity  Outcome Measure Difficulty turning over in bed (including adjusting bedclothes, sheets and blankets)?: Total Difficulty moving from lying on back to sitting on the side of the bed? : Total Difficulty sitting down on and standing up from a chair with arms (e.g., wheelchair, bedside commode, etc,.)?: Total Help needed moving to and from a bed to chair (including a wheelchair)?: Total Help needed walking in hospital room?: Total Help needed climbing 3-5 steps with a railing? : Total 6 Click Score: 6    End of Session Equipment Utilized During Treatment: Gait belt Activity Tolerance: Patient tolerated treatment well Patient left: in bed;with call bell/phone within reach;with bed alarm set Nurse Communication:  Mobility status PT Visit Diagnosis: Unsteadiness on feet (R26.81);Muscle weakness (generalized) (M62.81);Difficulty in walking, not elsewhere classified (R26.2);Pain Pain - Right/Left: Left Pain - part of body: Knee    Time: [id number] PT Time Calculation (min) (ACUTE ONLY): 31 min   Charges:   PT Evaluation $PT Eval Moderate Complexity: 1 Mod PT Treatments $Therapeutic Activity: 8-22 mins   PT G Codes:        Colin BroachSabra M. Adonia Porada PT, DPT  [phone]   Ruel FavorsSabra Aletha HalimMarie Masaye Gatchalian 04/21/2017, 4:47 PM

## 2017-04-21 NOTE — Progress Notes (Signed)
PROGRESS NOTE   Lindsey Sutton  UJW:[id number]  DOB: 11/11/1945  DOA: 04/20/2017 PCP: Pola CornBrown, Rita Harbison, NP   Brief Admission Hx: Lindsey Sutton is a 71 y.o. female with ESRD on HD T, R, Sat, missed several days of treatment apparently has been very weak and not acting herself for last several days  MDM/Assessment & Plan:   1. Metabolic encephalopathy - likely secondary to uremia from missed HD treatments, Admitted for hemodialysis, consulted nephrologyand they are planning for HD catch up.   Monitor electrolytes and telemetry.   2. Metabolic acidosis - likely due to uremia, hemodialysis treatment planned today and tomorrow.  3. ESRD on HD with long history of noncompliance - Pt admits that she doesn't take her renal meds regularly and does not go to HD regularly.  She will be admitted to have catch up hemodialysis.  Unfortunately given her long history of non compliance it is unlikely that she will change her behavior although she says she wants to live and family is interested in discussing renal transplant.  I explained to them that given her advanced age and well documented medication noncompliance she probably would not be a candidate for renal transplant.   4. Frequent falls, gait instability - This is a complex issue and there could be multiple reasons for this.  For now, will order Fall precautions, get PT/OT evaluation.  5. Left knee sprain - will have ace bandage or sleeve placed.  6. Chronic pain/opioid dependence - Family requested to be judicious with pain meds as patient has tendency to become very sedated on pain meds.  I re-ordered her home percocet but reduced dose to 1 tablet every 6 hours PRN.   7. Generalized weakness - multifactorial given anemia, metabolic derangements and chronic comorbidities, will need to follow carefully and treat what can be treated.  Cycle troponin.   8. Liver Cirrhosis - family says that this has been stable over past years, her LFTs appear normal  today, will monitor.  Watch tylenol use.  9. DM type 2 - hold oral sulfonylurea in hospital, sensitive sliding scale coverage ordered.  I am concerned about unrecognized hypoglycemia.  I wonder if she would be better served with a safer medication to manage her diabetes outpatient.  For now, will not give the sulfonylurea in the hospital.   10. Anemia of CKD - Hg 9.6, follow closely.   11. Hyponatremia - volume overloaded, should improve with hemodialysis and fluid removal.  12. Thrombocytopenia - follow, no bleeding.   13. AS - moderate - severe -  Pt had Echo in May 2018: LVEF 55-60, severe LVH, mod-sev AS, severe biatrial enlargement, mod TR, dil IVC.  14. Hyperphos - Pt refuses to take her renal medications per family.  They warned that she likely will not take them in the hospital either.   DVT Prophylaxis: lovenox Code Status: full   Family Communication: daughters at bedside  Disposition Plan: TBD   Consultants:  nephrology  Procedures:  Hemodialysis   Subjective: Pt seen in HD, Pt says that she is feeling better but still very weak.   Objective: Vitals:   04/21/17 1100 04/21/17 1130 04/21/17 1200 04/21/17 1216  BP: (!) 103/52 99/61 (!) 97/47 113/65  Pulse: 62 65 63 61  Resp:    14  Temp:    97.6 F (36.4 C)  TempSrc:    Oral  SpO2:    97%  Weight:    97.8 kg (215 lb 9.8 oz)  Height:  Intake/Output Summary (Last 24 hours) at 04/21/17 1231 Last data filed at 04/21/17 1216  Gross per 24 hour  Intake              300 ml  Output             4003 ml  Net            -3703 ml   Filed Weights   04/20/17 2033 04/21/17 0755 04/21/17 1216  Weight: 96.6 kg (213 lb) 101.8 kg (224 lb 6.9 oz) 97.8 kg (215 lb 9.8 oz)     REVIEW OF SYSTEMS  As per history otherwise all reviewed and reported negative  Exam:  General exam: awake, alert, NAD, chronically ill appearing.  Respiratory system: No increased work of breathing. Cardiovascular system: S1 & S2 heard, RRR.  No JVD, murmurs, gallops, clicks or pedal edema. Gastrointestinal system: Abdomen is nondistended, soft and nontender. Normal bowel sounds heard. Central nervous system: Alert and oriented. No focal neurological deficits. Extremities: Bilateral swollen feet with neuropathic changes and deformities.  Data Reviewed: Basic Metabolic Panel:  Recent Labs Lab 04/20/17 1502 04/21/17 0531 04/21/17 0628  NA 131*  --  130*  K 4.5  --  4.7  CL 96*  --  97*  CO2 19*  --  19*  GLUCOSE 105*  --  100*  BUN 82*  --  87*  CREATININE 6.46*  --  6.70*  CALCIUM 9.4  --  8.9  MG  --  2.0  --   PHOS  --   --  8.4*   Liver Function Tests:  Recent Labs Lab 04/20/17 1502 04/21/17 0628  AST 13*  --   ALT 7*  --   ALKPHOS 62  --   BILITOT 1.1  --   PROT 6.7  --   ALBUMIN 3.7 3.2*   No results for input(s): LIPASE, AMYLASE in the last 168 hours.  Recent Labs Lab 04/20/17 1458  AMMONIA 23   CBC:  Recent Labs Lab 04/20/17 1502 04/21/17 0531  WBC 4.3 3.9*  NEUTROABS 3.0  --   HGB 9.6* 8.2*  HCT 29.3* 26.0*  MCV 93.0 93.5  PLT 138* 129*   Cardiac Enzymes:  Recent Labs Lab 04/20/17 1502 04/20/17 2318  TROPONINI 0.05* 0.05*   CBG (last 3)   Recent Labs  04/20/17 2143  GLUCAP 135*   Recent Results (from the past 240 hour(s))  MRSA PCR Screening     Status: None   Collection Time: 04/20/17  8:07 PM  Result Value Ref Range Status   MRSA by PCR NEGATIVE NEGATIVE Final    Comment:        The GeneXpert MRSA Assay (FDA approved for NASAL specimens only), is one component of a comprehensive MRSA colonization surveillance program. It is not intended to diagnose MRSA infection nor to guide or monitor treatment for MRSA infections.      Studies: Dg Chest 2 View  Result Date: 04/20/2017 CLINICAL DATA:  Fall.  Dialysis patient EXAM: CHEST  2 VIEW COMPARISON:  Chest radiograph 01/19/2017 FINDINGS: LEFT-sided pacemaker overlies stable enlarged cardiac silhouette. No  effusion, infiltrate or pneumothorax. No evidence of fracture. IMPRESSION: Cardiomegaly without acute cardiopulmonary findings. Electronically Signed   By: Genevive BiStewart  Edmunds M.D.   On: 04/20/2017 16:33   Dg Knee Complete 4 Views Left  Result Date: 04/20/2017 CLINICAL DATA:  Knee pain.  Left hip pain EXAM: LEFT KNEE - COMPLETE 4+ VIEW COMPARISON:  01/19/2017 FINDINGS: Moderate joint effusion.  Femoral nail with tip at the anterior cortex of the distal femur. No acute fracture or dislocation. Osteopenia and atherosclerotic calcification. IMPRESSION: 1. Joint effusion without acute osseous finding. 2. Femoral nail with tip near the ventral cortex. 3. Osteopenia and atherosclerosis. Electronically Signed   By: Marnee Spring M.D.   On: 04/20/2017 14:34   Dg Knee Complete 4 Views Right  Result Date: 04/20/2017 CLINICAL DATA:  Right knee pain since a fall yesterday getting out of bed. EXAM: RIGHT KNEE - COMPLETE 4+ VIEW COMPARISON:  None. FINDINGS: The patient has a large joint effusion. No fracture is identified. Bones are somewhat osteopenic. Moderate degenerative change about the knee is most notable in the patellofemoral and lateral compartments. Extensive atherosclerosis is noted. IMPRESSION: Large joint effusion.  No fracture is identified. Osteopenia. Patellofemoral and lateral compartment osteoarthritis. Extensive atherosclerosis. Electronically Signed   By: Drusilla Kanner M.D.   On: 04/20/2017 16:28   Dg Hip Unilat With Pelvis 2-3 Views Left  Result Date: 04/20/2017 CLINICAL DATA:  Left hip pain after recent fall. EXAM: DG HIP (WITH OR WITHOUT PELVIS) 2-3V LEFT COMPARISON:  Left hip x-rays dated Feb 14, 2017. FINDINGS: Evaluation is limited due to body habitus. Prior left femur cephalomedullary rod fracture fixation with healing of the intertrochanteric fracture. No hardware complication. No new fracture. Mild degenerative changes of the bilateral hip joints. Diffuse osteopenia. Aortoiliac  atherosclerotic vascular disease. IMPRESSION: 1. No definite acute osseous abnormality. If there is persistent concern for left hip fracture, consider CT for further evaluation. 2. Healing intertrochanteric fracture status post ORIF. No evidence of hardware complication. Electronically Signed   By: Obie Dredge M.D.   On: 04/20/2017 14:37   Scheduled Meds: . [START ON 04/22/2017] calcitRIOL  1.5 mcg Oral Q T,Th,Sa-HD  . calcitRIOL  1.5 mcg Oral Once in dialysis  . darbepoetin (ARANESP) injection - DIALYSIS  200 mcg Intravenous Q Fri-HD  . enoxaparin  30 mg Subcutaneous Q24H  . feeding supplement (NEPRO CARB STEADY)  237 mL Oral Q1500  . folic acid  1 mg Oral BID  . insulin aspart  0-9 Units Subcutaneous TID WC  . mometasone-formoterol  2 puff Inhalation BID  . pantoprazole  40 mg Oral Daily  . pneumococcal 23 valent vaccine  0.5 mL Intramuscular Tomorrow-1000  . polyethylene glycol  17 g Oral Daily  . senna-docusate  1 tablet Oral BID  . sevelamer carbonate  800 mg Oral TID WC  . traZODone  50 mg Oral QHS   Continuous Infusions: . sodium chloride    . sodium chloride    . [START ON 04/22/2017] ferric gluconate (FERRLECIT/NULECIT) IV      Principal Problem:   Acute metabolic encephalopathy Active Problems:   ESRD (end stage renal disease) (HCC)   Gait instability   Frequent falls   Pacemaker   A-fib (HCC)   Systolic heart failure (HCC)   DM (diabetes mellitus), type 2 with renal complications (HCC)   Essential hypertension   Hyponatremia   NAFLD (nonalcoholic fatty liver disease)   Thrombocytopenia (HCC)   Secondary hyperparathyroidism of renal origin (HCC)   Hyperphosphatemia   Aortic stenosis, moderate-severe   Anemia in chronic kidney disease (CKD)  Time spent:   Standley Dakins, MD, FAAFP [facility] Pager [phone]  If 7PM-7AM, please contact night-coverage [email or web address] Password [email or web address] 04/21/2017, 12:31 PM    LOS: 1 day

## 2017-04-21 NOTE — Progress Notes (Signed)
OT Cancellation Note  Patient Details Name: Lindsey Sutton MRN: [id number] DOB: 03/31/1946   Cancelled Treatment:    Reason Eval/Treat Not Completed: Patient at procedure or test/ unavailable.  Will reattempt.  [name], OTR/L [phone]   Jeani HawkingConarpe, Kymari Nuon M 04/21/2017, 10:39 AM

## 2017-04-21 NOTE — Care Management Note (Signed)
Case Management Note  Patient Details  Name: Lindsey Sutton MRN: [id number] Date of Birth: 08/28/1946  Subjective/Objective:  Pt admitted on 04/20/17 s/p fall with metabolic encephalopathy and metabolic acidosis.  PTA, pt resides at home with daughter; she states she ambulates with a RW.                  Action/Plan: Pt states daughter provides 24h care at home.  Pt with frequent falls and HD noncompliance, per MD notes.  PT/OT consults pending.  Will follow for recommendations.    Expected Discharge Date:                  Expected Discharge Plan:     In-House Referral:     Discharge planning Services  CM Consult  Post Acute Care Choice:    Choice offered to:     DME Arranged:    DME Agency:     HH Arranged:    HH Agency:     Status of Service:  In process, will continue to follow  If discussed at Long Length of Stay Meetings, dates discussed:    Additional Comments:  Quintella BatonJulie W. Livio Ledwith, RN, BSN  Trauma/Neuro ICU Case Manager [phone]

## 2017-04-21 NOTE — Progress Notes (Signed)
PT Cancellation Note  Patient Details Name: Lindsey PearCarol Sutton MRN: [id number] DOB: 07/06/1946   Cancelled Treatment:    Reason Eval/Treat Not Completed: Patient at procedure or test/unavailable. Pt in HD. Will check back as time allows.    Colin BroachSabra M. Kevia Zaucha PT, DPT  [phone]  04/21/2017, 9:07 AM

## 2017-04-21 NOTE — Progress Notes (Signed)
Orthopedic Tech Progress Note Patient Details:  Nance PearCarol Jamerson 01/06/1946 [id number]  Ortho Devices Type of Ortho Device: Knee Sleeve Ortho Device/Splint Interventions: Application   Saul FordyceJennifer C Flora Parks 04/21/2017, 1:36 PM

## 2017-04-21 NOTE — Progress Notes (Signed)
Patient ID: Nance PearCarol Sutton, female   DOB: 06/12/1946, 71 y.o.   MRN: [id number]  [facility] Progress Note   Assessment/ Plan:   1. Fall: mechanical fall, possibly exacerbated by uremia. Will likely need SNF placment 2. ESRD: usually on a TTS schedule- plan for HD today and then again tomorrow to get her back onto her usual OP schedule. 3. Anemia of ESRD: Last Hgb as an outpatient was 9.2. Give Aranesp 200 mcg today 4. Metabolic Bone Disease: not taking sensipar, restarted on renvela 1600 TID with meals.  Continue calcitriol. 5.  Mod-severe AS:  Appears compensated and likely to pose a big challenge to UF at HD 6.  Afib s/p PPM 7.  NAFLD with ascites: has required paracentesis in the past.    Subjective:   Reports to be feeling fair and states she missed dialysis because she was feeling poorly.    Objective:   BP (!) 91/58   Pulse 60   Temp 97.6 F (36.4 C) (Oral)   Resp 16   Ht 5\' 7"  (1.702 m)   Wt 96.6 kg (213 lb)   SpO2 96%   BMI 33.36 kg/m   Physical Exam: BMW:UXLKGMWNUUVGen:comfortably resting at dialysis-- ecchymosis noted over left jaw/face OZD:GUYQICVS:pulse regular rhythm and normal rate, 3/6 blowing HSM Resp:clear bilaterally, no rales/rhonchi HKV:QQVZAbd:soft, obese, non tnder Ext: bilateral knee swelling/bogginess, no edema  Labs: BMET  Recent Labs Lab 04/20/17 1502 04/21/17 0628  NA 131* 130*  K 4.5 4.7  CL 96* 97*  CO2 19* 19*  GLUCOSE 105* 100*  BUN 82* 87*  CREATININE 6.46* 6.70*  CALCIUM 9.4 8.9  PHOS  --  8.4*   CBC  Recent Labs Lab 04/20/17 1502 04/21/17 0531  WBC 4.3 3.9*  NEUTROABS 3.0  --   HGB 9.6* 8.2*  HCT 29.3* 26.0*  MCV 93.0 93.5  PLT 138* 129*   Medications:    . [START ON 04/22/2017] calcitRIOL  1.5 mcg Oral Q T,Th,Sa-HD  . calcitRIOL  1.5 mcg Oral Once in dialysis  . darbepoetin (ARANESP) injection - DIALYSIS  200 mcg Intravenous Q Fri-HD  . enoxaparin  30 mg Subcutaneous Q24H  . feeding supplement (NEPRO CARB STEADY)  237 mL Oral Q1500   . folic acid  1 mg Oral BID  . insulin aspart  0-9 Units Subcutaneous TID WC  . mometasone-formoterol  2 puff Inhalation BID  . pantoprazole  40 mg Oral Daily  . pneumococcal 23 valent vaccine  0.5 mL Intramuscular Tomorrow-1000  . polyethylene glycol  17 g Oral Daily  . senna-docusate  1 tablet Oral BID  . sevelamer carbonate  800 mg Oral TID WC  . traZODone  50 mg Oral QHS   Zetta BillsJay Shawnta Zimbelman, MD 04/21/2017, 10:10 AM

## 2017-04-21 NOTE — Progress Notes (Signed)
Chaplain visited patient who would like prayer for depression.  Patient's husband died in March of this year and the patient expresses being depressed over it.  Patient says she also fell at home and wasn't paying attention to how she was getting out of the bed.  Patient states she is Advice workerentecostal Holiness and loves prayer.  Chaplain and patient prayed together.  Patient appreciative of all of the care she is receiving.      04/21/17 1351  Clinical Encounter Type  Visited With Patient  Visit Type Initial;Psychological support;Spiritual support  Spiritual Encounters  Spiritual Needs Prayer

## 2017-04-22 ENCOUNTER — Encounter (HOSPITAL_COMMUNITY): Payer: Self-pay | Admitting: Family Medicine

## 2017-04-22 LAB — BASIC METABOLIC PANEL
ANION GAP: 11 (ref 5–15)
BUN: 60 mg/dL — ABNORMAL HIGH (ref 6–20)
CALCIUM: 9 mg/dL (ref 8.9–10.3)
CHLORIDE: 96 mmol/L — AB (ref 101–111)
CO2: 24 mmol/L (ref 22–32)
Creatinine, Ser: 5.34 mg/dL — ABNORMAL HIGH (ref 0.44–1.00)
GFR calc Af Amer: 9 mL/min — ABNORMAL LOW (ref >60)
GFR calc non Af Amer: 7 mL/min — ABNORMAL LOW (ref >60)
GLUCOSE: 107 mg/dL — AB (ref 65–99)
POTASSIUM: 4.1 mmol/L (ref 3.5–5.1)
SODIUM: 131 mmol/L — AB (ref 135–145)

## 2017-04-22 LAB — HEMOGLOBIN A1C
HEMOGLOBIN A1C: 5 % (ref 4.8–5.6)
MEAN PLASMA GLUCOSE: 97 mg/dL

## 2017-04-22 LAB — GLUCOSE, CAPILLARY
Glucose-Capillary: 89 mg/dL (ref 65–99)
Glucose-Capillary: 128 mg/dL — ABNORMAL HIGH (ref 65–99)
Glucose-Capillary: 110 mg/dL — ABNORMAL HIGH (ref 65–99)

## 2017-04-22 LAB — CBC
HCT: 27.3 % — ABNORMAL LOW (ref 36.0–46.0)
Hemoglobin: 8.8 g/dL — ABNORMAL LOW (ref 12.0–15.0)
MCH: 30.7 pg (ref 26.0–34.0)
MCHC: 32.2 g/dL (ref 30.0–36.0)
MCV: 95.1 fL (ref 78.0–100.0)
PLATELETS: 132 10*3/uL — AB (ref 150–400)
RBC: 2.87 MIL/uL — AB (ref 3.87–5.11)
RDW: 16.9 % — ABNORMAL HIGH (ref 11.5–15.5)
WBC: 3.3 10*3/uL — AB (ref 4.0–10.5)

## 2017-04-22 LAB — PHOSPHORUS: Phosphorus: 6.6 mg/dL — ABNORMAL HIGH (ref 2.5–4.6)

## 2017-04-22 LAB — MAGNESIUM: Magnesium: 1.9 mg/dL (ref 1.7–2.4)

## 2017-04-22 MED ORDER — DOXERCALCIFEROL 4 MCG/2ML IV SOLN
INTRAVENOUS | Status: AC
  Filled 2017-04-22: qty 2

## 2017-04-22 MED ORDER — CALCITRIOL 0.5 MCG PO CAPS
ORAL_CAPSULE | ORAL | Status: AC
  Filled 2017-04-22: qty 3

## 2017-04-22 MED ORDER — DARBEPOETIN ALFA 200 MCG/0.4ML IJ SOSY
200.0000 ug | PREFILLED_SYRINGE | Freq: Once | INTRAMUSCULAR | Status: AC
Start: 2017-04-22 — End: 2017-04-22
  Administered 2017-04-22: 200 ug via SUBCUTANEOUS
  Filled 2017-04-22 (×2): qty 0.4

## 2017-04-22 MED ORDER — HEPARIN SODIUM (PORCINE) 1000 UNIT/ML DIALYSIS: 40.0000 [IU]/kg | INTRAMUSCULAR | Status: DC | PRN

## 2017-04-22 MED ORDER — DARBEPOETIN ALFA 200 MCG/0.4ML IJ SOSY: 200.0000 ug | PREFILLED_SYRINGE | INTRAMUSCULAR | Status: DC

## 2017-04-22 NOTE — Progress Notes (Signed)
OT Cancellation    04/22/17 0900  OT Visit Information  Last OT Received On 04/22/17  Reason Eval/Treat Not Completed Patient at procedure or test/ unavailable (HD. Will return as schedule allows.)   Calob Baskette MSOT, OTR/L Acute Rehab Pager: [phone] Office: [phone] 

## 2017-04-22 NOTE — Progress Notes (Addendum)
PROGRESS NOTE  Lindsey Sutton  ZOX:[id number]  DOB: 1945/10/29  DOA: 04/20/2017 PCP: [name], NP   Brief Admission Hx: Lindsey Sutton is a 71 y.o. female with ESRD on HD T, R, Sat, missed several days of treatment apparently has been very weak and not acting herself for last several days  MDM/Assessment & Plan:   1. Metabolic encephalopathy - likely secondary to uremia from missed HD treatments, Admitted for hemodialysis, consulted nephrologyand they are planning for HD catch up.   Monitor electrolytes and telemetry.   2. Metabolic acidosis - likely due to uremia, hemodialysis treatment planned today and tomorrow.  3. ESRD on HD with long history of noncompliance - Pt admits that she doesn't take her renal meds regularly and does not go to HD regularly.  She will be admitted to have catch up hemodialysis.  Unfortunately given her long history of non compliance it is unlikely that she will change her behavior although she says she wants to live and family is interested in discussing renal transplant.  I explained to them that given her advanced age and well documented medication noncompliance she probably would not be a candidate for renal transplant.   4. Frequent falls, gait instability - This is a complex issue and there could be multiple reasons for this.  For now, will order Fall precautions, PT/OT recommending SNF but patient doesn't want to go, may have to go home with family and home health services.  I am going to ask palliative medicine to help [place] establish some goals of care for patient because she is refusing services and care and not sure what longterm plan is going to be.  5. Left knee sprain - will have ace bandage or sleeve placed.   6. Chronic pain/opioid dependence - Family requested to be judicious with pain meds as patient has tendency to become very sedated on pain meds.  I re-ordered her home percocet but reduced dose to 1 tablet every 6 hours PRN.   7. Generalized  weakness - multifactorial given anemia, metabolic derangements and chronic comorbidities, will need to follow carefully and treat what can be treated.  Pt needs SNF but does not want to go and likely will not participate in rehab.   8. Liver Cirrhosis - family says that this has been stable over past years, her LFTs appear normal today, will monitor.  Watch tylenol use.  May need paracentesis before discharge.    9. DM type 2 - hold oral sulfonylurea in hospital, sensitive sliding scale coverage ordered.  I am concerned about unrecognized hypoglycemia.  I wonder if she would be better served with a safer medication to manage her diabetes outpatient.  For now, will not give the sulfonylurea in the hospital.   10. Chronic afib chadvasc 4 not on anticoagulation, but received pacemaker.  11. Anemia of CKD - Hg 9.6, follow closely.   12. Hyponatremia - volume overloaded, should improve with hemodialysis and fluid removal.  13. Thrombocytopenia - follow, no bleeding.   14. AS - moderate - severe -  Pt had Echo in May 2018: LVEF 55-60, severe LVH, mod-sev AS, severe biatrial enlargement, mod TR, dil IVC.  15. Hyperphos - Pt refuses to take her renal medications per family.  They warned that she likely will not take them in the hospital either.   DVT Prophylaxis: lovenox Code Status: full   Family Communication: daughters at bedside  Disposition Plan: TBD   Consultants:  nephrology  Procedures:  Hemodialysis  Subjective: Pt having knee pain, pt says that she is weak, but does not want to pursue SNF   Objective: Vitals:   04/22/17 0945 04/22/17 1000 04/22/17 1030 04/22/17 1110  BP: (!) 81/40 (!) 108/53 (!) 86/40 121/81  Pulse: 63 62 60 64  Resp:    18  Temp:    98 F (36.7 C)  TempSrc:    Oral  SpO2:    100%  Weight:    96 kg (211 lb 10.3 oz)  Height:        Intake/Output Summary (Last 24 hours) at 04/22/17 1523 Last data filed at 04/22/17 1110  Gross per 24 hour  Intake                 0 ml  Output             3000 ml  Net            -3000 ml   Filed Weights   04/21/17 2103 04/22/17 0710 04/22/17 1110  Weight: 97.7 kg (215 lb 6.2 oz) 99 kg (218 lb 4.1 oz) 96 kg (211 lb 10.3 oz)     REVIEW OF SYSTEMS  As per history otherwise all reviewed and reported negative  Exam:  General exam: awake, alert, NAD, chronically ill appearing.  Respiratory system: No increased work of breathing. Cardiovascular system: S1 & S2 heard, RRR. No JVD, murmurs, gallops, clicks or pedal edema. Gastrointestinal system: Abdomen is nondistended, soft and nontender. Normal bowel sounds heard. Central nervous system: Alert and oriented. No focal neurological deficits. Extremities: Bilateral swollen feet with neuropathic changes and deformities. Sleeve on left knee, less edema noted  Data Reviewed: Basic Metabolic Panel:  Recent Labs Lab 04/20/17 1502 04/21/17 0531 04/21/17 0628 04/22/17 0524  NA 131*  --  130* 131*  K 4.5  --  4.7 4.1  CL 96*  --  97* 96*  CO2 19*  --  19* 24  GLUCOSE 105*  --  100* 107*  BUN 82*  --  87* 60*  CREATININE 6.46*  --  6.70* 5.34*  CALCIUM 9.4  --  8.9 9.0  MG  --  2.0  --  1.9  PHOS  --   --  8.4* 6.6*   Liver Function Tests:  Recent Labs Lab 04/20/17 1502 04/21/17 0628  AST 13*  --   ALT 7*  --   ALKPHOS 62  --   BILITOT 1.1  --   PROT 6.7  --   ALBUMIN 3.7 3.2*   No results for input(s): LIPASE, AMYLASE in the last 168 hours.  Recent Labs Lab 04/20/17 1458  AMMONIA 23   CBC:  Recent Labs Lab 04/20/17 1502 04/21/17 0531 04/22/17 0524  WBC 4.3 3.9* 3.3*  NEUTROABS 3.0  --   --   HGB 9.6* 8.2* 8.8*  HCT 29.3* 26.0* 27.3*  MCV 93.0 93.5 95.1  PLT 138* 129* 132*   Cardiac Enzymes:  Recent Labs Lab 04/20/17 1502 04/20/17 2318  TROPONINI 0.05* 0.05*   CBG (last 3)   Recent Labs  04/21/17 1319 04/21/17 1659 04/22/17 1137  GLUCAP 124* 126* 89   Recent Results (from the past 240 hour(s))  MRSA PCR Screening      Status: None   Collection Time: 04/20/17  8:07 PM  Result Value Ref Range Status   MRSA by PCR NEGATIVE NEGATIVE Final    Comment:        The GeneXpert MRSA Assay (FDA approved for  NASAL specimens only), is one component of a comprehensive MRSA colonization surveillance program. It is not intended to diagnose MRSA infection nor to guide or monitor treatment for MRSA infections.      Studies: Dg Chest 2 View  Result Date: 04/20/2017 CLINICAL DATA:  Fall.  Dialysis patient EXAM: CHEST  2 VIEW COMPARISON:  Chest radiograph 01/19/2017 FINDINGS: LEFT-sided pacemaker overlies stable enlarged cardiac silhouette. No effusion, infiltrate or pneumothorax. No evidence of fracture. IMPRESSION: Cardiomegaly without acute cardiopulmonary findings. Electronically Signed   By: Genevive BiStewart  Edmunds M.D.   On: 04/20/2017 16:33   Dg Knee Complete 4 Views Right  Result Date: 04/20/2017 CLINICAL DATA:  Right knee pain since a fall yesterday getting out of bed. EXAM: RIGHT KNEE - COMPLETE 4+ VIEW COMPARISON:  None. FINDINGS: The patient has a large joint effusion. No fracture is identified. Bones are somewhat osteopenic. Moderate degenerative change about the knee is most notable in the patellofemoral and lateral compartments. Extensive atherosclerosis is noted. IMPRESSION: Large joint effusion.  No fracture is identified. Osteopenia. Patellofemoral and lateral compartment osteoarthritis. Extensive atherosclerosis. Electronically Signed   By: Drusilla Kannerhomas  Dalessio M.D.   On: 04/20/2017 16:28   Scheduled Meds: . calcitRIOL  1.5 mcg Oral Q T,Th,Sa-HD  . calcitRIOL  1.5 mcg Oral Once in dialysis  . darbepoetin (ARANESP) injection - DIALYSIS  200 mcg Subcutaneous Once  . [START ON 05/02/2017] darbepoetin (ARANESP) injection - DIALYSIS  200 mcg Intravenous Q Tue-HD  . enoxaparin  30 mg Subcutaneous Q24H  . feeding supplement (NEPRO CARB STEADY)  237 mL Oral Q1500  . folic acid  1 mg Oral BID  . insulin aspart  0-9  Units Subcutaneous TID WC  . mometasone-formoterol  2 puff Inhalation BID  . pantoprazole  40 mg Oral Daily  . pneumococcal 23 valent vaccine  0.5 mL Intramuscular Tomorrow-1000  . polyethylene glycol  17 g Oral Daily  . senna-docusate  1 tablet Oral BID  . sevelamer carbonate  800 mg Oral TID WC  . traZODone  50 mg Oral QHS   Continuous Infusions: . sodium chloride    . sodium chloride    . ferric gluconate (FERRLECIT/NULECIT) IV Stopped (04/22/17 1050)    Principal Problem:   Acute metabolic encephalopathy Active Problems:   ESRD (end stage renal disease) (HCC)   Gait instability   Frequent falls   Pacemaker   A-fib (HCC)   Systolic heart failure (HCC)   DM (diabetes mellitus), type 2 with renal complications (HCC)   Essential hypertension   Hyponatremia   NAFLD (nonalcoholic fatty liver disease)   Thrombocytopenia (HCC)   Secondary hyperparathyroidism of renal origin (HCC)   Hyperphosphatemia   Aortic stenosis, moderate-severe   Anemia in chronic kidney disease (CKD)  Time spent:   Standley Dakinslanford Joon Pohle, MD, FAAFP [facility] Pager [phone]  If 7PM-7AM, please contact night-coverage [email or web address] Password [email or web address] 04/22/2017, 3:23 PM    LOS: 2 days

## 2017-04-22 NOTE — Progress Notes (Signed)
[facility] Progress Note  Subjective: no c/o's.  4L off w HD yesterady.  BP's soft  Vitals:   04/22/17 0900 04/22/17 0930 04/22/17 0945 04/22/17 1000  BP: (!) 89/50 (!) 89/52 (!) 81/40 (!) 108/53  Pulse: 62 63 63 62  Resp:      Temp:      TempSrc:      SpO2:      Weight:      Height:        Inpatient medications: . calcitRIOL      . calcitRIOL  1.5 mcg Oral Q T,Th,Sa-HD  . calcitRIOL  1.5 mcg Oral Once in dialysis  . darbepoetin (ARANESP) injection - DIALYSIS  200 mcg Intravenous Q Fri-HD  . enoxaparin  30 mg Subcutaneous Q24H  . feeding supplement (NEPRO CARB STEADY)  237 mL Oral Q1500  . folic acid  1 mg Oral BID  . insulin aspart  0-9 Units Subcutaneous TID WC  . mometasone-formoterol  2 puff Inhalation BID  . pantoprazole  40 mg Oral Daily  . pneumococcal 23 valent vaccine  0.5 mL Intramuscular Tomorrow-1000  . polyethylene glycol  17 g Oral Daily  . senna-docusate  1 tablet Oral BID  . sevelamer carbonate  800 mg Oral TID WC  . traZODone  50 mg Oral QHS   . sodium chloride    . sodium chloride    . ferric gluconate (FERRLECIT/NULECIT) IV 62.5 mg (04/22/17 0950)   sodium chloride, sodium chloride, acetaminophen **OR** acetaminophen, albuterol, alteplase, heparin, lidocaine (PF), lidocaine-prilocaine, methocarbamol, ondansetron, oxyCODONE-acetaminophen, pentafluoroprop-tetrafluoroeth  Exam: Elderly WF, bruised L eye  No jvd Chest CTA bilat RRR no mrg Abd marked 3+ ascites, venous varicosities about the abd, nontender, +BS Ext no LE edema NF, Ox 3, pleasant  Dialysis: [facility] 4h   2/2.25 bath  94kg   Hep 5000  LRC AVF - Mircera 200 mcg q 2 weeks (last given 04/04/17) - Venofer 50 mg q week (last given 04/06/17) - Calcitriol 1.5 mcg TIW      Impression: 1. Fall/ uremia / missed HD - improved, still unable to walk (x 1 mo) 2. Ascites - this is severe and must be limiting her activity. Has had para x 1 per pt since onset about 1-2 mo ago.   3. Afib sp PPM - no cardiac meds 4. Chron pain / depression - on antidepressants and Lyrica 5. Aortic stenosis - mod-severe 6. MBD of CKD 7. Anemia of CKD - stable, was supposed to get darbe 200 ug yest but didn't 8. Volume - up 5kg by wts, UF same as tol w HD today  Plan - HD today   Lindsey Moselleob [name] MD [facility] pager [phone]   04/22/2017, 2:10 PM    Recent Labs Lab 04/20/17 1502 04/21/17 0628 04/22/17 0524  NA 131* 130* 131*  K 4.5 4.7 4.1  CL 96* 97* 96*  CO2 19* 19* 24  GLUCOSE 105* 100* 107*  BUN 82* 87* 60*  CREATININE 6.46* 6.70* 5.34*  CALCIUM 9.4 8.9 9.0  PHOS  --  8.4* 6.6*    Recent Labs Lab 04/20/17 1502 04/21/17 0628  AST 13*  --   ALT 7*  --   ALKPHOS 62  --   BILITOT 1.1  --   PROT 6.7  --   ALBUMIN 3.7 3.2*    Recent Labs Lab 04/20/17 1502 04/21/17 0531 04/22/17 0524  WBC 4.3 3.9* 3.3*  NEUTROABS 3.0  --   --  HGB 9.6* 8.2* 8.8*  HCT 29.3* 26.0* 27.3*  MCV 93.0 93.5 95.1  PLT 138* 129* 132*   Iron/TIBC/Ferritin/ %Sat No results found for: IRON, TIBC, FERRITIN, IRONPCTSAT

## 2017-04-22 NOTE — NC FL2 (Signed)
[facility] MEDICAID FL2 LEVEL OF CARE SCREENING TOOL     IDENTIFICATION  Patient Name: Lindsey Sutton Birthdate: 03/29/1946 Sex: female Admission Date (Current Location): 04/20/2017  [facility] [place]   Facility and Address:  [facility] [place], [address], [place], [place] [address]      Provider Number: [id number]  Attending Physician Name and Address:  Cleora FleetJohnson, Clanford L, MD  Relative Name and Phone Number:       Current Level of Care: Hospital Recommended Level of Care: Skilled Nursing Facility Prior Approval Number:    Date Approved/Denied:   PASRR Number:  [id number]   Discharge Plan: SNF    Current Diagnoses: Patient Active Problem List   Diagnosis Date Noted  . Acute metabolic encephalopathy 04/20/2017  . Gait instability 04/20/2017  . Frequent falls 04/20/2017  . Anemia in chronic kidney disease (CKD) 04/20/2017  . Pancytopenia (HCC) 01/23/2017  . Secondary hyperparathyroidism of renal origin (HCC) 01/22/2017  . Hyperphosphatemia 01/22/2017  . Aortic stenosis, moderate-severe 01/22/2017  . Essential hypertension 01/21/2017  . Hyponatremia 01/21/2017  . NAFLD (nonalcoholic fatty liver disease) 56/21/308605/01/2017  . Thrombocytopenia (HCC) 01/21/2017  . Obesity (BMI 30-39.9) 01/20/2017  . Hip fx (HCC) 01/19/2017  . A-fib (HCC) 01/19/2017  . ESRD (end stage renal disease) (HCC) 01/19/2017  . Pacemaker   . Iron deficiency anemia   . Systolic heart failure (HCC)   . Ascites   . DM (diabetes mellitus), type 2 with renal complications (HCC)   . Nondisplaced intertrochanteric fracture of left femur, initial encounter for closed fracture (HCC)     Orientation RESPIRATION BLADDER Height & Weight     Self, Time, Situation, Place  Normal Continent Weight: 218 lb 4.1 oz (99 kg) Height:  5\' 7"  (170.2 cm)  BEHAVIORAL SYMPTOMS/MOOD NEUROLOGICAL BOWEL NUTRITION STATUS      Continent  (Please see d/c summary)  AMBULATORY STATUS  COMMUNICATION OF NEEDS Skin   Extensive Assist Verbally Normal                       Personal Care Assistance Level of Assistance  Bathing, Feeding, Dressing Bathing Assistance: Maximum assistance Feeding assistance: Limited assistance Dressing Assistance: Maximum assistance     Functional Limitations Info  Sight, Hearing, Speech Sight Info: Adequate Hearing Info: Adequate Speech Info: Adequate    SPECIAL CARE FACTORS FREQUENCY  PT (By licensed PT), OT (By licensed OT)     PT Frequency: 2x week OT Frequency: 2x week            Contractures Contractures Info: Not present    Additional Factors Info  Code Status, Allergies, Insulin Sliding Scale Code Status Info: Full Code Allergies Info: Penicillins   Insulin Sliding Scale Info: Insulin aspart (novoLOG) injection 0-9 Units. Correction coverage: Sensitive (thin, NPO, renal). CBG < 70: implement hypoglycemia protocol.CBG 70 - 120: 0 units.CBG 121 - 150: 1 unit.CBG 151 - 200: 2 units.CBG 201 - 250: 3 units.CBG 251 - 300: 5 units.CBG 301 - 350: 7 units.CBG 351 - 400 9 units.CBG > 400 call MD and obtain STAT lab verification.       Current Medications (04/22/2017):  This is the current hospital active medication list Current Facility-Administered Medications  Medication Dose Route Frequency Provider Last Rate Last Dose  . calcitRIOL (ROCALTROL) 0.5 MCG capsule           . 0.9 %  sodium chloride infusion  100 mL Intravenous PRN Bufford ButtnerUpton, Elizabeth, MD      .  0.9 %  sodium chloride infusion  100 mL Intravenous PRN Bufford ButtnerUpton, Elizabeth, MD      . acetaminophen (TYLENOL) tablet 650 mg  650 mg Oral Q6H PRN Johnson, Clanford L, MD       Or  . acetaminophen (TYLENOL) suppository 650 mg  650 mg Rectal Q6H PRN Johnson, Clanford L, MD      . albuterol (PROVENTIL) (2.5 MG/3ML) 0.083% nebulizer solution 2.5 mg  2.5 mg Nebulization Q2H PRN Johnson, Clanford L, MD      . alteplase (CATHFLO ACTIVASE) injection 2 mg  2 mg Intracatheter Once PRN  Bufford ButtnerUpton, Elizabeth, MD      . calcitRIOL (ROCALTROL) capsule 1.5 mcg  1.5 mcg Oral Q T,Th,Sa-HD Bufford ButtnerUpton, Elizabeth, MD   1.5 mcg at 04/22/17 [phone]  . calcitRIOL (ROCALTROL) capsule 1.5 mcg  1.5 mcg Oral Once in dialysis Johnson, Clanford L, MD      . Darbepoetin Alfa (ARANESP) injection 200 mcg  200 mcg Intravenous Q Clint LippsFri-HD Upton, Elizabeth, MD      . enoxaparin (LOVENOX) injection 30 mg  30 mg Subcutaneous Q24H Johnson, Clanford L, MD   30 mg at 04/21/17 2128  . feeding supplement (NEPRO CARB STEADY) liquid 237 mL  237 mL Oral Q1500 Johnson, Clanford L, MD   237 mL at 04/21/17 1714  . ferric gluconate (NULECIT) 62.5 mg in sodium chloride 0.9 % 100 mL IVPB  62.5 mg Intravenous Q Sat-HD Bufford ButtnerUpton, Elizabeth, MD 105 mL/hr at 04/22/17 0950 62.5 mg at 04/22/17 0950  . folic acid (FOLVITE) tablet 1 mg  1 mg Oral BID Laural BenesJohnson, Clanford L, MD   1 mg at 04/21/17 2128  . heparin injection 1,000 Units  1,000 Units Dialysis PRN Bufford ButtnerUpton, Elizabeth, MD      . heparin injection 3,900 Units  40 Units/kg Dialysis PRN Zetta BillsPatel, Jay, MD      . insulin aspart (novoLOG) injection 0-9 Units  0-9 Units Subcutaneous TID WC Johnson, Clanford L, MD   1 Units at 04/21/17 1753  . lidocaine (PF) (XYLOCAINE) 1 % injection 5 mL  5 mL Intradermal PRN Bufford ButtnerUpton, Elizabeth, MD      . lidocaine-prilocaine (EMLA) cream 1 application  1 application Topical PRN Bufford ButtnerUpton, Elizabeth, MD      . methocarbamol (ROBAXIN) tablet 500 mg  500 mg Oral Q6H PRN Laural BenesJohnson, Clanford L, MD   500 mg at 04/21/17 2127  . mometasone-formoterol (DULERA) 200-5 MCG/ACT inhaler 2 puff  2 puff Inhalation BID Laural BenesJohnson, Clanford L, MD   2 puff at 04/20/17 2006  . ondansetron (ZOFRAN) tablet 4 mg  4 mg Oral Q6H PRN Johnson, Clanford L, MD      . oxyCODONE-acetaminophen (PERCOCET/ROXICET) 5-325 MG per tablet 1-2 tablet  1-2 tablet Oral Q6H PRN Cleora FleetJohnson, Clanford L, MD   2 tablet at 04/22/17 0353  . pantoprazole (PROTONIX) EC tablet 40 mg  40 mg Oral Daily Johnson, Clanford L, MD   40 mg  at 04/21/17 1318  . pentafluoroprop-tetrafluoroeth (GEBAUERS) aerosol 1 application  1 application Topical PRN Bufford ButtnerUpton, Elizabeth, MD      . pneumococcal 23 valent vaccine (PNU-IMMUNE) injection 0.5 mL  0.5 mL Intramuscular Tomorrow-1000 Johnson, Clanford L, MD      . polyethylene glycol (MIRALAX / GLYCOLAX) packet 17 g  17 g Oral Daily Johnson, Clanford L, MD   17 g at 04/21/17 1319  . senna-docusate (Senokot-S) tablet 1 tablet  1 tablet Oral BID Standley DakinsJohnson, Clanford L, MD   1 tablet at 04/21/17 2128  .  sevelamer carbonate (RENVELA) tablet 800 mg  800 mg Oral TID WC Johnson, Clanford L, MD   800 mg at 04/21/17 1752  . traZODone (DESYREL) tablet 50 mg  50 mg Oral QHS Johnson, Clanford L, MD   50 mg at 04/21/17 2128     Discharge Medications: Please see discharge summary for a list of discharge medications.  Relevant Imaging Results:  Relevant Lab Results:   Additional Information SSN: [id number]  Patient gets Dialysis at [facility] center Tuesday, Thursday, and Saturday.   Loni Delbridge A Ruhi Kopke, LCSW

## 2017-04-22 NOTE — Evaluation (Signed)
Occupational Therapy Evaluation Patient Details Name: Lindsey Sutton MRN: [id number] DOB: 02/05/1946 Today's Date: 04/22/2017    History of Present Illness Pt is a 71 yo female admitted through ED on 04/20/17 following multiple falls at home and inability to get up from a seated position without Max A. Pt missed several days of dialysis and developed significant weakness. Pt fell out of the bed resulting in bruising on chin and L shoulder and fell in the shower with bruising in her back. Pt was diagnosed with metabolic encephalopathy, metabolic acidosis due to toxicity. PMH significant for ESRDon HD, A-fib, CKD, DM2, hip fx 01/19/17 with ORIF with IM nail on L, NAFLD, obesity, pacemaker, sysolic CHF, thrombocytopenia.    Clinical Impression   PTA, pt was living with her daughter and performed her ADLs and simple IADLs. Pt currently requires Max A for LB ADLs and Max A +2 for functional transfers. Pt would benefit from acute OT to increase safety and independence with ADLs and functional mobility. Recommend dc to SNF for post acute rehab to optimize pt functional performance and safety. However, pt stating she wants to go home and not to SNF.      Follow Up Recommendations  SNF;Supervision/Assistance - 24 hour;Other (comment) (Pt stating she does not want SNF and wants to return home)    Equipment Recommendations  None recommended by OT    Recommendations for Other Services PT consult     Precautions / Restrictions Precautions Precautions: Fall Precaution Comments: multiple falls prior to admission Restrictions Weight Bearing Restrictions: No      Mobility Bed Mobility               General bed mobility comments: In recliner upon arrival  Transfers                 General transfer comment: Declined to perform sit<>stand    Balance Overall balance assessment: Needs assistance Sitting-balance support: No upper extremity supported;Feet supported Sitting balance-Leahy  Scale: Good Sitting balance - Comments: Able to maintain balance while reach forward to asjust sock                                   ADL either performed or assessed with clinical judgement   ADL Overall ADL's : Needs assistance/impaired Eating/Feeding: Set up;Sitting   Grooming: Set up;Sitting   Upper Body Bathing: Set up;Sitting   Lower Body Bathing: Maximal assistance;+2 for physical assistance;Sit to/from stand Lower Body Bathing Details (indicate cue type and reason): NT reports that pt requires +2 A to stand during bathing of LB Upper Body Dressing : Set up;Sitting   Lower Body Dressing: Maximal assistance;+2 for physical assistance;Sit to/from stand Lower Body Dressing Details (indicate cue type and reason): Pt unable to reach down and adjust socks. Max A to don knee sleeve                General ADL Comments: Pt up in recliner upon arrival. Declined to perform sit<>Stand at this time due to pain in knees and request to stay in the recliner. Pt is set up for UB and grooming while seated and requires Max A for LB ADLs. When approached about SNF for rehab at dc, pt stating she wants to return home.      Vision Baseline Vision/History: No visual deficits Patient Visual Report: No change from baseline       Perception     Praxis  Pertinent Vitals/Pain Pain Assessment: Faces Faces Pain Scale: Hurts even more Pain Location: bilateral LE at knees with extension Pain Descriptors / Indicators: Grimacing;Guarding Pain Intervention(s): Monitored during session;Limited activity within patient's tolerance;Repositioned     Hand Dominance Right   Extremity/Trunk Assessment Upper Extremity Assessment Upper Extremity Assessment: Generalized weakness   Lower Extremity Assessment Lower Extremity Assessment: Generalized weakness   Cervical / Trunk Assessment Cervical / Trunk Assessment: Kyphotic   Communication Communication Communication: No  difficulties   Cognition Arousal/Alertness: Awake/alert Behavior During Therapy: WFL for tasks assessed/performed Overall Cognitive Status: Within Functional Limits for tasks assessed                                     General Comments       Exercises     Shoulder Instructions      Home Living Family/patient expects to be discharged to:: Private residence Living Arrangements: Children Available Help at Discharge: Family;Available 24 hours/day Type of Home: House Home Access: Level entry     Home Layout: One level     Bathroom Shower/Tub: Producer, television/film/videoWalk-in shower   Bathroom Toilet: Handicapped height Bathroom Accessibility: Yes How Accessible: Accessible via wheelchair Home Equipment: Walker - 2 wheels;Walker - 4 wheels;Bedside commode;Shower seat;Shower seat - built in;Wheelchair - manual;Hand held shower head;Grab bars - toilet;Grab bars - tub/shower   Additional Comments: home is set up to be handicapped accessible including widened doorways.       Prior Functioning/Environment Level of Independence: Independent with assistive device(s)        Comments: ADLs and light IADLs including cooking. Daughter performs IADLs including communty mobility and cleaning.         OT Problem List: Decreased strength;Decreased range of motion;Decreased activity tolerance;Impaired balance (sitting and/or standing);Decreased safety awareness;Decreased knowledge of use of DME or AE;Pain      OT Treatment/Interventions: Self-care/ADL training;Therapeutic exercise;Energy conservation;DME and/or AE instruction;Therapeutic activities;Patient/family education    OT Goals(Current goals can be found in the care plan section) Acute Rehab OT Goals Patient Stated Goal: to be able to get up again OT Goal Formulation: With patient Time For Goal Achievement: 05/06/17 Potential to Achieve Goals: Good ADL Goals Pt Will Perform Lower Body Bathing: with mod assist;with caregiver  independent in assisting;sit to/from stand;with adaptive equipment Pt Will Perform Lower Body Dressing: with mod assist;sit to/from stand;with adaptive equipment;with caregiver independent in assisting Pt Will Transfer to Toilet: with mod assist;with +2 assist;stand pivot transfer;bedside commode Pt Will Perform Toileting - Clothing Manipulation and hygiene: with mod assist;with 2+ total assist;sit to/from stand;with caregiver independent in assisting  OT Frequency: Min 2X/week   Barriers to D/C:            Co-evaluation              AM-PAC PT "6 Clicks" Daily Activity     Outcome Measure Help from another person eating meals?: None Help from another person taking care of personal grooming?: A Little Help from another person toileting, which includes using toliet, bedpan, or urinal?: A Lot Help from another person bathing (including washing, rinsing, drying)?: A Lot Help from another person to put on and taking off regular upper body clothing?: A Little Help from another person to put on and taking off regular lower body clothing?: A Lot 6 Click Score: 16   End of Session Nurse Communication: Mobility status  Activity Tolerance: Patient limited by fatigue;Patient limited by pain  Patient left: in chair;with call bell/phone within reach  OT Visit Diagnosis: Unsteadiness on feet (R26.81);Other abnormalities of gait and mobility (R26.89);Repeated falls (R29.6);History of falling (Z91.81);Muscle weakness (generalized) (M62.81);Pain Pain - Right/Left: Left (Bilateral; L>R) Pain - part of body: Knee                Time: 1357-1406 OT Time Calculation (min): 9 min Charges:  OT General Charges $OT Visit: 1 Procedure OT Evaluation $OT Eval Low Complexity: 1 Procedure G-Codes:     Lindsey Sutton MSOT, OTR/L Acute Rehab Pager: [phone] Office: [phone]  Theodoro Grist Lindsey Sutton 04/22/2017, 2:32 PM

## 2017-04-23 DIAGNOSIS — Z515 Encounter for palliative care: Secondary | ICD-10-CM

## 2017-04-23 LAB — CBC
HCT: 26.9 % — ABNORMAL LOW (ref 36.0–46.0)
Hemoglobin: 8.4 g/dL — ABNORMAL LOW (ref 12.0–15.0)
MCH: 29.5 pg (ref 26.0–34.0)
MCHC: 31.2 g/dL (ref 30.0–36.0)
MCV: 94.4 fL (ref 78.0–100.0)
PLATELETS: 140 10*3/uL — AB (ref 150–400)
RBC: 2.85 MIL/uL — AB (ref 3.87–5.11)
RDW: 16.2 % — ABNORMAL HIGH (ref 11.5–15.5)
WBC: 3.1 10*3/uL — AB (ref 4.0–10.5)

## 2017-04-23 LAB — MAGNESIUM: Magnesium: 1.9 mg/dL (ref 1.7–2.4)

## 2017-04-23 LAB — BASIC METABOLIC PANEL
ANION GAP: 10 (ref 5–15)
BUN: 42 mg/dL — ABNORMAL HIGH (ref 6–20)
CHLORIDE: 97 mmol/L — AB (ref 101–111)
CO2: 25 mmol/L (ref 22–32)
CREATININE: 4.54 mg/dL — AB (ref 0.44–1.00)
Calcium: 9.2 mg/dL (ref 8.9–10.3)
GFR calc non Af Amer: 9 mL/min — ABNORMAL LOW (ref >60)
GFR, EST AFRICAN AMERICAN: 10 mL/min — AB (ref >60)
Glucose, Bld: 98 mg/dL (ref 65–99)
POTASSIUM: 3.6 mmol/L (ref 3.5–5.1)
SODIUM: 132 mmol/L — AB (ref 135–145)

## 2017-04-23 LAB — GLUCOSE, CAPILLARY
GLUCOSE-CAPILLARY: 97 mg/dL (ref 65–99)
GLUCOSE-CAPILLARY: 120 mg/dL — AB (ref 65–99)
GLUCOSE-CAPILLARY: 107 mg/dL — AB (ref 65–99)
GLUCOSE-CAPILLARY: 150 mg/dL — AB (ref 65–99)

## 2017-04-23 LAB — PHOSPHORUS: Phosphorus: 5.6 mg/dL — ABNORMAL HIGH (ref 2.5–4.6)

## 2017-04-23 MED ORDER — ESCITALOPRAM OXALATE 10 MG PO TABS
10.0000 mg | ORAL_TABLET | Freq: Every day | ORAL | Status: DC
  Administered 2017-04-23 – 2017-04-25 (×3): 10 mg via ORAL
  Filled 2017-04-23 (×3): qty 1

## 2017-04-23 MED ORDER — PREGABALIN 75 MG PO CAPS
75.0000 mg | ORAL_CAPSULE | Freq: Two times a day (BID) | ORAL | Status: DC
  Administered 2017-04-23 – 2017-04-25 (×4): 75 mg via ORAL
  Filled 2017-04-23 (×4): qty 1

## 2017-04-23 MED ORDER — ASPIRIN EC 81 MG PO TBEC
81.0000 mg | DELAYED_RELEASE_TABLET | Freq: Every day | ORAL | Status: DC
  Administered 2017-04-23 – 2017-04-25 (×3): 81 mg via ORAL
  Filled 2017-04-23 (×3): qty 1

## 2017-04-23 MED ORDER — DARBEPOETIN ALFA 200 MCG/0.4ML IJ SOSY: 200.0000 ug | PREFILLED_SYRINGE | INTRAMUSCULAR | Status: DC

## 2017-04-23 NOTE — Progress Notes (Signed)
PROGRESS NOTE  Cyrstal Leitz  GEX:[id number]  DOB: Nov 01, 1945  DOA: 04/20/2017 PCP: [name], NP   Brief Admission Hx: Haja Crego is a 71 y.o. female with ESRD on HD T, R, Sat, missed several days of treatment apparently has been very weak and not acting herself for last several days  MDM/Assessment & Plan:   1. Metabolic encephalopathy - much improved after HD.  likely secondary to uremia from missed HD treatments, Admitted for hemodialysis, consulted nephrology and they provided HD catch up.   Monitor electrolytes and telemetry.   2. Metabolic acidosis - likely due to uremia, resolved with HD. 3. ESRD on HD with long history of noncompliance - Pt admits that she doesn't take her renal meds regularly and does not go to HD regularly.  She will be admitted to have catch up hemodialysis.  Unfortunately given her long history of non compliance it is unlikely that she will change her behavior although she says she wants to live and family is interested in discussing renal transplant.  I explained to them that given her advanced age and well documented medication noncompliance she probably would not be a candidate for renal transplant.   4. Frequent falls, gait instability - This is a complex issue and there could be multiple reasons for this.  For now, will order Fall precautions, PT/OT recommending SNF but patient doesn't want to go, may have to go home with family and home health services.  I am going to ask palliative medicine to help [place] establish some goals of care for patient because she is refusing services and care and not sure what longterm plan is going to be.  5. Left knee sprain - improved after sleeve placed.   6. Chronic pain/opioid dependence - Family requested to be judicious with pain meds as patient has tendency to become very sedated on pain meds.  I re-ordered her home percocet but reduced dose to 1 tablet every 6 hours PRN.   7. Generalized weakness - multifactorial  given anemia, metabolic derangements and chronic comorbidities, will need to follow carefully and treat what can be treated.  Pt needs SNF but does not want to go and likely will not participate in rehab.   8. Liver Cirrhosis - family says that this has been stable over past years, her LFTs appear normal today, will monitor.  Watch tylenol use.  May need paracentesis before discharge.    9. DM type 2 - hold oral sulfonylurea in hospital, sensitive sliding scale coverage ordered.  I am concerned about unrecognized hypoglycemia.  I wonder if she would be better served with a safer medication to manage her diabetes outpatient.  For now, will not give the sulfonylurea in the hospital.   10. Chronic afib chadvasc 4 not on anticoagulation, but received pacemaker.  11. Anemia of CKD - Hg 9.6, follow closely.   12. Hyponatremia - volume overloaded, should improve with hemodialysis and fluid removal.  13. Thrombocytopenia - follow, no bleeding.   14. AS - moderate - severe -  Pt had Echo in May 2018: LVEF 55-60, severe LVH, mod-sev AS, severe biatrial enlargement, mod TR, dil IVC.  15. Hyperphos - Pt refuses to take her renal medications per family.  They warned that she likely will not take them in the hospital either.   DVT Prophylaxis: lovenox Code Status: full   Family Communication: daughters at bedside  Disposition Plan: TBD   Consultants:  nephrology  Procedures:  Hemodialysis   Subjective: Pt  says that her knee pain is noticeably improved and moving legs more, still declines SNF   Objective: Vitals:   04/22/17 2141 04/23/17 0525 04/23/17 0800 04/23/17 0919  BP: (!) 104/59 118/72  114/72  Pulse: (!) 59 60  60  Resp: 15 16  18   Temp: 98.2 F (36.8 C) 98.4 F (36.9 C)  98.2 F (36.8 C)  TempSrc: Oral Oral  Oral  SpO2: 98% 97% 98% 98%  Weight: 93 kg (205 lb 0.4 oz)     Height:        Intake/Output Summary (Last 24 hours) at 04/23/17 1403 Last data filed at 04/23/17 1300  Gross  per 24 hour  Intake              586 ml  Output                0 ml  Net              586 ml   Filed Weights   04/22/17 0710 04/22/17 1110 04/22/17 2141  Weight: 99 kg (218 lb 4.1 oz) 96 kg (211 lb 10.3 oz) 93 kg (205 lb 0.4 oz)     REVIEW OF SYSTEMS  As per history otherwise all reviewed and reported negative  Exam:  General exam: awake, alert, NAD, chronically ill appearing.  Respiratory system: No increased work of breathing. Cardiovascular system: S1 & S2 heard, RRR. No JVD, murmurs, gallops, clicks or pedal edema. Gastrointestinal system: Abdomen is nondistended, soft and nontender. Normal bowel sounds heard. Central nervous system: Alert and oriented. No focal neurological deficits. Extremities: Bilateral swollen feet with neuropathic changes and deformities. Sleeve on left knee, less edema noted  Data Reviewed: Basic Metabolic Panel:  Recent Labs Lab 04/20/17 1502 04/21/17 0531 04/21/17 0628 04/22/17 0524 04/23/17 0523  NA 131*  --  130* 131* 132*  K 4.5  --  4.7 4.1 3.6  CL 96*  --  97* 96* 97*  CO2 19*  --  19* 24 25  GLUCOSE 105*  --  100* 107* 98  BUN 82*  --  87* 60* 42*  CREATININE 6.46*  --  6.70* 5.34* 4.54*  CALCIUM 9.4  --  8.9 9.0 9.2  MG  --  2.0  --  1.9 1.9  PHOS  --   --  8.4* 6.6* 5.6*   Liver Function Tests:  Recent Labs Lab 04/20/17 1502 04/21/17 0628  AST 13*  --   ALT 7*  --   ALKPHOS 62  --   BILITOT 1.1  --   PROT 6.7  --   ALBUMIN 3.7 3.2*   No results for input(s): LIPASE, AMYLASE in the last 168 hours.  Recent Labs Lab 04/20/17 1458  AMMONIA 23   CBC:  Recent Labs Lab 04/20/17 1502 04/21/17 0531 04/22/17 0524 04/23/17 0523  WBC 4.3 3.9* 3.3* 3.1*  NEUTROABS 3.0  --   --   --   HGB 9.6* 8.2* 8.8* 8.4*  HCT 29.3* 26.0* 27.3* 26.9*  MCV 93.0 93.5 95.1 94.4  PLT 138* 129* 132* 140*   Cardiac Enzymes:  Recent Labs Lab 04/20/17 1502 04/20/17 2318  TROPONINI 0.05* 0.05*   CBG (last 3)   Recent Labs   04/22/17 2132 04/23/17 0717 04/23/17 1133  GLUCAP 110* 97 120*   Recent Results (from the past 240 hour(s))  MRSA PCR Screening     Status: None   Collection Time: 04/20/17  8:07 PM  Result Value Ref Range  Status   MRSA by PCR NEGATIVE NEGATIVE Final    Comment:        The GeneXpert MRSA Assay (FDA approved for NASAL specimens only), is one component of a comprehensive MRSA colonization surveillance program. It is not intended to diagnose MRSA infection nor to guide or monitor treatment for MRSA infections.      Studies: No results found. Scheduled Meds: . calcitRIOL  1.5 mcg Oral Q T,Th,Sa-HD  . calcitRIOL  1.5 mcg Oral Once in dialysis  . [START ON 04/29/2017] darbepoetin (ARANESP) injection - DIALYSIS  200 mcg Intravenous Q Sat-HD  . enoxaparin  30 mg Subcutaneous Q24H  . feeding supplement (NEPRO CARB STEADY)  237 mL Oral Q1500  . folic acid  1 mg Oral BID  . insulin aspart  0-9 Units Subcutaneous TID WC  . mometasone-formoterol  2 puff Inhalation BID  . pantoprazole  40 mg Oral Daily  . pneumococcal 23 valent vaccine  0.5 mL Intramuscular Tomorrow-1000  . polyethylene glycol  17 g Oral Daily  . senna-docusate  1 tablet Oral BID  . sevelamer carbonate  800 mg Oral TID WC  . traZODone  50 mg Oral QHS   Continuous Infusions: . sodium chloride    . sodium chloride    . ferric gluconate (FERRLECIT/NULECIT) IV Stopped (04/22/17 1050)    Principal Problem:   Acute metabolic encephalopathy Active Problems:   ESRD (end stage renal disease) (HCC)   Gait instability   Frequent falls   Pacemaker   A-fib (HCC)   Systolic heart failure (HCC)   DM (diabetes mellitus), type 2 with renal complications (HCC)   Essential hypertension   Hyponatremia   NAFLD (nonalcoholic fatty liver disease)   Thrombocytopenia (HCC)   Secondary hyperparathyroidism of renal origin (HCC)   Hyperphosphatemia   Aortic stenosis, moderate-severe   Anemia in chronic kidney disease  (CKD)  Time spent:   Standley Dakinslanford Johnson, MD, FAAFP [facility] Pager [phone]  If 7PM-7AM, please contact night-coverage [email or web address] Password [email or web address] 04/23/2017, 2:03 PM    LOS: 3 days

## 2017-04-23 NOTE — Clinical Social Work Note (Signed)
Per MD and OT note pt does not wants to go to SNF at d/c. Clinical Social Worker will sign off for now as social work intervention is no longer needed. Please consult us again if new need arises.   [place]ake WilsonBridget Evon Lopezperez, ConnecticutLCSWA [phone]

## 2017-04-23 NOTE — Consult Note (Signed)
Consultation Note Date: 04/23/2017   Patient Name: Lindsey Sutton  DOB: [birth year]  MRN: [id number]  Age / Sex: 71 y.o., female  PCP: Valentina Gu, NP Referring Physician: Murlean Iba, MD  Reason for Consultation: Establishing goals of care and Psychosocial/spiritual support  HPI/Patient Profile: 71 y.o. female  with past medical history of End-stage renal disease on hemodialysis for 8 years (long history of noncompliance with hemodialysis as well as noncompliance with oral medications, diet), hip fracture in May 3532, systolic heart failure with ejection fraction of 55-60% (moderate to severe aortic stenosis, moderate tricuspid regurgitation, severe left ventricular hypertrophy), diabetes, depression, untreated obstructive sleep apnea admitted on 04/20/2017 after missing dialysis, Tuesday and Thursday of last week, altered mental status. She has undergone emergency dialysis after admission  Clinical Assessment and Goals of Care: Met with patient, 2 of her daughters at the bedside, one daughter, Lindsey Sutton, via conference call for goals of care discussion. As noted per chart review, [name] has a long history of noncompliance with hemodialysis, medications, as well as diet. Patient is very weak and has been falling at home. The left side of her face is bruised from a recent fall where she "fell out of the bed and hit her head on the nightstand", then the next day fell and family were unable to get her up off the floor. Patient shares with me and this was new information to her daughters, that she was diagnosed with obstructive sleep apnea when she lived in [place] 6 years ago but has been noncompliant with CPAP. She also was placed on oxygen at bedtime but has not utilized that in a year.  I talked frankly to patient and family that by continuing to miss dialysis, noncompliance with medication and diet patient  now is trending toward multisystem organ impairment. I did review with patient and her family echocardiogram results from May 2018. Patient is very independent and I discussed this in the context of not necessarily end-of-life, but debility. Patient is adamantly opposed to living in a nursing home.  Additionally her husband died in January 03, 2017, and she is been grieving, depressed..  Patient at this point can speak for herself, albeit she is making poor decisions. Her daughter, Lindsey Sutton, is her healthcare power of attorney. She does have 2 other daughters as well as a son, and they do make decisions as a family. She lives in a single-family home with her daughter Lindsey Sutton, Kim's fianc, with other children living nearby    [place]  She wants to continue with HD  I discussed advance care planning specifically CODE STATUS. I explained terms of full code versus DO NOT RESUSCITATE. Patient and family state that she wishes to remain a full code Untreated sleep apnea could be contributing to significant fatigue, making her depression worse, contributing to her noncompliance with hemodialysis as well as other medical interventions. I recommended to her daughter Lindsey Sutton, that she arrange a follow-up appointment with patient's primary care provider in [place], to review this hospitalization, as  well as pursuing an appointment with a pulmonologist in [place] to evaluate resumption of home oxygen as well as sleep study. Patient verbalized willingness to do this Patient needs PT evaluation in this hospitalization to determine whether she is functionally able to return to her home. Patient has to be able to ambulate in order to live in her home even with family support  Code Status/Advance Care Planning:  Full code    Symptom Management:   Fatigue: Recommend outpatient sleep study to treat OSA, appointment with pulmonologist to evaluate resuming home O2   Depression: Family states patient  is on Lexapro 5 mg at home. She has been on this dose for some time. Recommend uptitrating this to a therapeutic dose such as 66m for 7 days, and increase to 23mdaily. This may be something that needs to be addressed by her primary care provider on an outpatient basis as well  Palliative Prophylaxis:   Bowel Regimen and Oral Care  Additional Recommendations (Limitations, Scope, Preferences):  Full Scope Treatment  Psycho-social/Spiritual:   Desire for further Chaplaincy support:no  Additional Recommendations: Referral to Community Resources   Prognosis:   Unable to determine  Discharge Planning: Home with Home Health      Primary Diagnoses: Present on Admission: . Acute metabolic encephalopathy . Systolic heart failure (HCLansing. Secondary hyperparathyroidism of renal origin (HCTyrone. Pacemaker . NAFLD (nonalcoholic fatty liver disease) . Hyperphosphatemia . DM (diabetes mellitus), type 2 with renal complications (HCIngham. ESRD (end stage renal disease) (HCPenn Wyn[place]. Essential hypertension . Hyponatremia . Thrombocytopenia (HCGenesee. A-fib (HCLuke. Gait instability . Anemia in chronic kidney disease (CKD)   I have reviewed the medical record, interviewed the patient and family, and examined the patient. The following aspects are pertinent.  Past Medical History:  Diagnosis Date  . A-fib (HCCold Bay  . Anxiety   . Arthritis    "legs" (04/20/2017)  . COPD (chronic obstructive pulmonary disease) (HCWiggins  . Depression   . ESRD (end stage renal disease) on dialysis (HCGreen   "TTS; [facility]; [place]" (04/20/2017)  . Heart murmur   . History of blood transfusion    related to childbirth  . Iron deficiency anemia   . NAFLD (nonalcoholic fatty liver disease) 01/21/2017  . Obesity (BMI 30-39.9) 01/20/2017  . Pacemaker   . Pulmonary edema   . Secondary hyperparathyroidism of renal origin (HCGolden Gate5/02/2017  . Systolic heart failure (HCHewlett Neck  . Thrombocytopenia (HCTrezevant5/01/2017  . Type II diabetes  mellitus (HCJosephine   Social History   Social History  . Marital status: Widowed    Spouse name: N/A  . Number of children: N/A  . Years of education: N/A   Social History Main Topics  . Smoking status: Former Smoker    Packs/day: 1.00    Years: 33.00    Types: Cigarettes    Quit date: 09/23/1996  . Smokeless tobacco: Never Used  . Alcohol use No     Comment: 04/20/2017 "nothing in the 2000s"  . Drug use: No  . Sexual activity: No   Other Topics Concern  . None   Social History Narrative  . None   Family History  Problem Relation Age of Onset  . Diabetes Mother   . Hypertension Father    Scheduled Meds: . calcitRIOL  1.5 mcg Oral Q T,Th,Sa-HD  . calcitRIOL  1.5 mcg Oral Once in dialysis  . [START ON 04/29/2017] darbepoetin (ARANESP) injection - DIALYSIS  200 mcg Intravenous  Q Sat-HD  . enoxaparin  30 mg Subcutaneous Q24H  . feeding supplement (NEPRO CARB STEADY)  237 mL Oral Q1500  . folic acid  1 mg Oral BID  . insulin aspart  0-9 Units Subcutaneous TID WC  . mometasone-formoterol  2 puff Inhalation BID  . pantoprazole  40 mg Oral Daily  . pneumococcal 23 valent vaccine  0.5 mL Intramuscular Tomorrow-1000  . polyethylene glycol  17 g Oral Daily  . senna-docusate  1 tablet Oral BID  . sevelamer carbonate  800 mg Oral TID WC  . traZODone  50 mg Oral QHS   Continuous Infusions: . sodium chloride    . sodium chloride    . ferric gluconate (FERRLECIT/NULECIT) IV Stopped (04/22/17 1050)   PRN Meds:.sodium chloride, sodium chloride, acetaminophen **OR** acetaminophen, albuterol, alteplase, heparin, lidocaine (PF), lidocaine-prilocaine, methocarbamol, ondansetron, oxyCODONE-acetaminophen, pentafluoroprop-tetrafluoroeth Medications Prior to Admission:  Prior to Admission medications   Medication Sig Start Date End Date Taking? Authorizing Provider  aspirin EC 81 MG tablet Take 81 mg by mouth daily.   Yes [provider]  budesonide-formoterol (SYMBICORT) 160-4.5  MCG/ACT inhaler Inhale 2 puffs into the lungs 2 (two) times daily.   Yes [provider]  cinacalcet (SENSIPAR) 30 MG tablet Take 30 mg by mouth daily with supper.   Yes [provider]  escitalopram (LEXAPRO) 5 MG tablet Take 5 mg by mouth daily.   Yes [provider]  folic acid (FOLVITE) 1 MG tablet Take 1 mg by mouth 2 (two) times daily.   Yes [provider]  pregabalin (LYRICA) 75 MG capsule Take 75 mg by mouth 2 (two) times daily.   Yes [provider]  senna-docusate (SENOKOT-S) 8.6-50 MG tablet Take 1 tablet by mouth 2 (two) times daily. 01/26/17  Yes Rai, Ripudeep K, MD  sevelamer carbonate (RENVELA) 800 MG tablet Take 800 mg by mouth 3 (three) times daily with meals.   Yes [provider]  traZODone (DESYREL) 50 MG tablet Take 50 mg by mouth at bedtime.   Yes [provider]  acetaminophen (TYLENOL) 650 MG CR tablet Take 650 mg by mouth every 8 (eight) hours as needed for pain.    [provider]   Allergies  Allergen Reactions  . Penicillins     Has patient had a PCN reaction causing immediate rash, facial/tongue/throat swelling, SOB or lightheadedness with hypotension: Yes Has patient had a PCN reaction causing severe rash involving mucus membranes or skin necrosis: No Has patient had a PCN reaction that required hospitalization No Has patient had a PCN reaction occurring within the last 10 years: No If all of the above answers are "NO", then may proceed with Cephalosporin use.    Review of Systems  Constitutional: Positive for activity change, fatigue and unexpected weight change.  HENT: Negative.   Eyes: Negative.   Respiratory: Positive for chest tightness.   Endocrine: Negative.   Genitourinary:       Missed HD Tuesday and Thursday last week  Musculoskeletal: Positive for arthralgias.  Skin:       Fell at home and hit the nightstand; facial bruising, soreness down left side of face    Allergic/Immunologic: Negative.   Neurological: Positive for weakness.  Hematological: Bruises/bleeds easily.  Psychiatric/Behavioral: Positive for decreased concentration and dysphoric mood.    Physical Exam  Constitutional: She appears well-developed and well-nourished.  Older female lying in hospital bed in no acute distress.  Pulmonary/Chest: Effort normal.  Patient readily falling asleep when not  actively engaged in conversation  Abdominal: She exhibits distension.  Neurological:  Patient is alert and oriented 3 but she readily falls asleep when not being directly spoken to  Skin: Skin is warm and dry.  Multiple bruises noted to upper extremities as well as to left side of face secondary to a reported fall prior to admission  Psychiatric: Her behavior is normal. Thought content normal.  Affect constricted mood described as depressed  Nursing note and vitals reviewed.   Vital Signs: BP 114/72 (BP Location: Left Arm)   Pulse 60   Temp 98.2 F (36.8 C) (Oral)   Resp 18   Ht 5' 7"  (1.702 m)   Wt 93 kg (205 lb 0.4 oz)   SpO2 98%   BMI 32.11 kg/m  Pain Assessment: No/denies pain   Pain Score: 4    SpO2: SpO2: 98 % O2 Device:SpO2: 98 % O2 Flow Rate: .   IO: Intake/output summary:  Intake/Output Summary (Last 24 hours) at 04/23/17 1542 Last data filed at 04/23/17 1300  Gross per 24 hour  Intake              586 ml  Output                0 ml  Net              586 ml    LBM: Last BM Date: 04/21/17 Baseline Weight: Weight: 96.6 kg (213 lb) Most recent weight: Weight: 93 kg (205 lb 0.4 oz)     Palliative Assessment/Data:   Flowsheet Rows     Most Recent Value  Intake Tab  Referral Department  Hospitalist  Unit at Time of Referral  Med/Surg Unit  Palliative Care Primary Diagnosis  Nephrology  Date Notified  04/22/17  Palliative Care Type  New Palliative care  Reason for referral  Clarify Goals of Care, Psychosocial or Spiritual support  Date of Admission   04/20/17  Date first seen by Palliative Care  04/23/17  # of days Palliative referral response time  1 Day(s)  # of days IP prior to Palliative referral  2  Clinical Assessment  Palliative Performance Scale Score  50%  Pain Max last 24 hours  0  Pain Min Last 24 hours  0  Dyspnea Max Last 24 Hours  0  Dyspnea Min Last 24 hours  0  Nausea Max Last 24 Hours  0  Nausea Min Last 24 Hours  0  Anxiety Max Last 24 Hours  0  Anxiety Min Last 24 Hours  0  Psychosocial & Spiritual Assessment  Palliative Care Outcomes  Patient/Family meeting held?  Yes  Who was at the meeting?  pt, 3 daughters  Palliative Care Outcomes  Clarified goals of care  Palliative Care follow-up planned  No      Time In: 1430 Time Out: 1540 Time Total: 70 min Greater than 50%  of this time was spent counseling and coordinating care related to the above assessment and plan.Staffed with Dr. Wynetta Emery  Signed by: Dory Horn, NP   Please contact Palliative Medicine Team phone at [phone] for questions and concerns.  For individual provider: See Shea Evans

## 2017-04-23 NOTE — Progress Notes (Signed)
[facility] Progress Note   Subjective: Talkative then tearful. Says yesterday was her wedding anniversary-first anniversary since husband died last winter. No new C/O. HD yesterday. Ran full tx.   Objective Vitals:   04/22/17 2115 04/22/17 2141 04/23/17 0525 04/23/17 0800  BP:  (!) 104/59 118/72   Pulse:  (!) 59 60   Resp:  15 16   Temp:  98.2 F (36.8 C) 98.4 F (36.9 C)   TempSrc:  Oral Oral   SpO2: 97% 98% 97% 98%  Weight:  93 kg (205 lb 0.4 oz)    Height:       Physical Exam General: Chronically ill appearing elderly female in NAD. Ecchymosis noted on L cheek and mandible.  Heart: S1,S2, Harsh 3/6 systolic M. No JVD Lungs:  CTAB A/P Abdomen: ascites present. Active BS Extremities: No LE edema Dialysis Access: LUA AVF + bruit   Additional Objective Labs: Basic Metabolic Panel:  Recent Labs Lab 04/21/17 0628 04/22/17 0524 04/23/17 0523  NA 130* 131* 132*  K 4.7 4.1 3.6  CL 97* 96* 97*  CO2 19* 24 25  GLUCOSE 100* 107* 98  BUN 87* 60* 42*  CREATININE 6.70* 5.34* 4.54*  CALCIUM 8.9 9.0 9.2  PHOS 8.4* 6.6* 5.6*   Liver Function Tests:  Recent Labs Lab 04/20/17 1502 04/21/17 0628  AST 13*  --   ALT 7*  --   ALKPHOS 62  --   BILITOT 1.1  --   PROT 6.7  --   ALBUMIN 3.7 3.2*   No results for input(s): LIPASE, AMYLASE in the last 168 hours. CBC:  Recent Labs Lab 04/20/17 1502 04/21/17 0531 04/22/17 0524 04/23/17 0523  WBC 4.3 3.9* 3.3* 3.1*  NEUTROABS 3.0  --   --   --   HGB 9.6* 8.2* 8.8* 8.4*  HCT 29.3* 26.0* 27.3* 26.9*  MCV 93.0 93.5 95.1 94.4  PLT 138* 129* 132* 140*   Blood Culture    Component Value Date/Time   SDES URINE, CLEAN CATCH 01/25/2017 2240   SPECREQUEST NONE 01/25/2017 2240   CULT MULTIPLE SPECIES PRESENT, SUGGEST RECOLLECTION (A) 01/25/2017 2240   REPTSTATUS 01/27/2017 FINAL 01/25/2017 2240    Cardiac Enzymes:  Recent Labs Lab 04/20/17 1502 04/20/17 2318  TROPONINI 0.05* 0.05*   CBG:  Recent  Labs Lab 04/21/17 1659 04/22/17 1137 04/22/17 1616 04/22/17 2132 04/23/17 0717  GLUCAP 126* 89 128* 110* 97   Iron Studies: No results for input(s): IRON, TIBC, TRANSFERRIN, FERRITIN in the last 72 hours. @lablastinr3 @ Studies/Results: No results found. Medications: . sodium chloride    . sodium chloride    . ferric gluconate (FERRLECIT/NULECIT) IV Stopped (04/22/17 1050)   . calcitRIOL  1.5 mcg Oral Q T,Th,Sa-HD  . calcitRIOL  1.5 mcg Oral Once in dialysis  . [START ON 05/02/2017] darbepoetin (ARANESP) injection - DIALYSIS  200 mcg Intravenous Q Tue-HD  . enoxaparin  30 mg Subcutaneous Q24H  . feeding supplement (NEPRO CARB STEADY)  237 mL Oral Q1500  . folic acid  1 mg Oral BID  . insulin aspart  0-9 Units Subcutaneous TID WC  . mometasone-formoterol  2 puff Inhalation BID  . pantoprazole  40 mg Oral Daily  . pneumococcal 23 valent vaccine  0.5 mL Intramuscular Tomorrow-1000  . polyethylene glycol  17 g Oral Daily  . senna-docusate  1 tablet Oral BID  . sevelamer carbonate  800 mg Oral TID WC  . traZODone  50 mg Oral QHS   [place]  TTS EDW 94 kg HD 4 hrs Bath 2K 2.25 Ca, Dialyzer F180 BFR 450 Heparin 5000 u bolus Access L RC AVF. Mircera 200 mcg q 2 weeks (last given 04/04/17) Venofer 50 mg q week (last given 04/06/17) Calcitriol 1.5 mcg TIW  Assessment/Plan: 1. S/P mechanical fall: Possibly related to uremia. Traditionally non-compliant with HD. Recent L hip fx in May. [facility] and sit in chair per patient. Seen by PT , recommended SNF but pt refused, next rec is 24 hr observation at home. 2. Depression: Husband died this past winter. Patient actually probably missed HD during that period trying to assist him in his last days with dementia but has never accepted being on HD. Wedding anniversary this weekend. Pt tearful. Emotional support to pt. Cont trazodone although worried these meds may contribute to gait instability.  3. Liver Cirrhosis: Ascites  present. May need paracentesis prior to DC. Per primary 4. Palliative care consult: per primary. Believe this is excellent suggestion to establish goals of care.  5. Afib: No anticoagulation. Has PPM.  6. Moderate to severe AS: EF 55-60% severe LVH probably made worse by non-compliance with HD. Per primary.  7. ESRD -HD 08/03 and 08/04. Scr 5.34 BUN 60. Next HD 04/25/17 on T,Th,S schedule. K+ 4.1 8. Anemia - HGB 8.8 Aranesp 200 mcg IV 04/22/17. Last Fe 33  Tsat 17% Ferritin 1636. Go off protocol and load with Fe-nulicit 125 mg IV X 5.  9. Secondary hyperparathyroidism - Monthly labs from HD center do not reflect compliance with BMD meds. Cont sensipar, increase renvela, cont VDRA. Ca 9.0 C Ca 9.6 Phos 6.6.  10. HTN/volume - BP controlled. No OP Antihypertensive meds. HD 04/22/17 Pre wt 99 kg. Net UF 3.0 liters Post wt 96 kg. She does get to EDW 94 kg when she stays full tx. Keep current EDW and push to goal next tx.  11. Nutrition - Albumin 3.2. Renal/Carb mod diet. Add prostat/renal vit. 12. DM: per primary  Rita H. Brown NP-C 04/23/2017, 9:20 AM  [facility] [phone]  Pt seen, examined, agree w assess/plan as above with additions as indicated. Patient has really bad new ascites over the past 1-2 months.  Her ascites is dramatic.   She has become more debilitated.  She is unrealistic about how sick she is in my opinion.  Agree w/ pall care consult, it is very likely that she is going to continue to decline w/ this new liver problem and coexisting renal failure.   Vinson Moselleob Averyana Pillars MD [facility] pager [phone]    cell [phone] 04/23/2017, 12:57 PM

## 2017-04-24 LAB — BASIC METABOLIC PANEL
ANION GAP: 12 (ref 5–15)
BUN: 48 mg/dL — ABNORMAL HIGH (ref 6–20)
CHLORIDE: 96 mmol/L — AB (ref 101–111)
CO2: 23 mmol/L (ref 22–32)
CREATININE: 5.24 mg/dL — AB (ref 0.44–1.00)
Calcium: 9 mg/dL (ref 8.9–10.3)
GFR calc non Af Amer: 8 mL/min — ABNORMAL LOW (ref >60)
GFR, EST AFRICAN AMERICAN: 9 mL/min — AB (ref >60)
Glucose, Bld: 92 mg/dL (ref 65–99)
Potassium: 3.7 mmol/L (ref 3.5–5.1)
Sodium: 131 mmol/L — ABNORMAL LOW (ref 135–145)

## 2017-04-24 LAB — CBC
HEMATOCRIT: 25 % — AB (ref 36.0–46.0)
HEMOGLOBIN: 8.1 g/dL — AB (ref 12.0–15.0)
MCH: 30.5 pg (ref 26.0–34.0)
MCHC: 32.4 g/dL (ref 30.0–36.0)
MCV: 94 fL (ref 78.0–100.0)
Platelets: 126 10*3/uL — ABNORMAL LOW (ref 150–400)
RBC: 2.66 MIL/uL — ABNORMAL LOW (ref 3.87–5.11)
RDW: 16.4 % — ABNORMAL HIGH (ref 11.5–15.5)
WBC: 3.4 10*3/uL — AB (ref 4.0–10.5)

## 2017-04-24 LAB — GLUCOSE, CAPILLARY
GLUCOSE-CAPILLARY: 191 mg/dL — AB (ref 65–99)
GLUCOSE-CAPILLARY: 110 mg/dL — AB (ref 65–99)
Glucose-Capillary: 138 mg/dL — ABNORMAL HIGH (ref 65–99)
Glucose-Capillary: 115 mg/dL — ABNORMAL HIGH (ref 65–99)
Glucose-Capillary: 110 mg/dL — ABNORMAL HIGH (ref 65–99)

## 2017-04-24 NOTE — Progress Notes (Signed)
PROGRESS NOTE  Lindsey Sutton  ZOX:[id number]  DOB: 10/29/1945  DOA: 04/20/2017 PCP: Pola CornBrown, Rita Harbison, NP   Brief Admission Hx: Lindsey PearCarol Mostafa is a 71 y.o. female with ESRD on HD T, R, Sat, missed several days of treatment apparently has been very weak and not acting herself for last several days  MDM/Assessment & Plan:   1. Metabolic encephalopathy - much improved after HD.  likely secondary to uremia from missed HD treatments, Admitted for hemodialysis, consulted nephrology and they provided HD catch up.   Monitor electrolytes and telemetry.   2. Metabolic acidosis - likely due to uremia, resolved with HD. 3. ESRD on HD with long history of noncompliance - Pt admits that she doesn't take her renal meds regularly and does not go to HD regularly.  She will be admitted to have catch up hemodialysis.  Unfortunately given her long history of non compliance it is unlikely that she will change her behavior although she says she wants to live and family is interested in discussing renal transplant.  I explained to them that given her advanced age and well documented medication noncompliance she probably would not be a candidate for renal transplant.   4. Frequent falls, gait instability - This is a complex issue and there could be multiple reasons for this.  For now, will order Fall precautions, PT/OT recommending SNF but patient doesn't want to go, may have to go home with family and home health services.  I am going to ask palliative medicine to help us establish some goals of care for patient because she is refusing services and care and not sure what longterm plan is going to be.  5. Left knee sprain - improved after sleeve placed.   6. Chronic pain/opioid dependence - Family requested to be judicious with pain meds as patient has tendency to become very sedated on pain meds.  I re-ordered her home percocet but reduced dose to 1 tablet every 6 hours PRN.   7. Generalized weakness - multifactorial  given anemia, metabolic derangements and chronic comorbidities, will need to follow carefully and treat what can be treated.  Pt needs SNF but does not want to go and likely will not participate in rehab.   8. Liver Cirrhosis - family says that this has been stable over past years, her LFTs appear normal today, will monitor.  Watch tylenol use.  May need paracentesis before discharge.    9. DM type 2 - hold oral sulfonylurea in hospital, sensitive sliding scale coverage ordered.  I am concerned about unrecognized hypoglycemia.  I wonder if she would be better served with a safer medication to manage her diabetes outpatient.  For now, will not give the sulfonylurea in the hospital.   10. Chronic afib chadvasc 4 not on anticoagulation, but received pacemaker.  11. Anemia of CKD - Hg 9.6, follow closely.   12. Hyponatremia - volume overloaded, should improve with hemodialysis and fluid removal.  13. Thrombocytopenia - follow, no bleeding.   14. AS - moderate - severe -  Pt had Echo in May 2018: LVEF 55-60, severe LVH, mod-sev AS, severe biatrial enlargement, mod TR, dil IVC.  15. Hyperphos - Pt refuses to take her renal medications per family.  They warned that she likely will not take them in the hospital either.   DVT Prophylaxis: lovenox Code Status: full   Family Communication: daughters at bedside  Disposition Plan: SNF  Consultants:  nephrology  Procedures:  Hemodialysis   Subjective: Pt feeling  better today, she decided to go to SNF.   Objective: Vitals:   04/23/17 2100 04/24/17 0605 04/24/17 0926 04/24/17 0930  BP: 96/65 103/61  123/70  Pulse: 60 60  60  Resp: 14 17  18   Temp: 97.6 F (36.4 C) 98 F (36.7 C)  98.2 F (36.8 C)  TempSrc: Oral Oral  Oral  SpO2: 99% 99% 99% 100%  Weight: 94 kg (207 lb 3.7 oz)     Height:        Intake/Output Summary (Last 24 hours) at 04/24/17 1442 Last data filed at 04/24/17 0600  Gross per 24 hour  Intake              360 ml  Output                 0 ml  Net              360 ml   Filed Weights   04/22/17 1110 04/22/17 2141 04/23/17 2100  Weight: 96 kg (211 lb 10.3 oz) 93 kg (205 lb 0.4 oz) 94 kg (207 lb 3.7 oz)   REVIEW OF SYSTEMS  As per history otherwise all reviewed and reported negative  Exam:  General exam: awake, alert, NAD, chronically ill appearing.  Respiratory system: No increased work of breathing. Cardiovascular system: S1 & S2 heard, RRR. No JVD, murmurs, gallops, clicks or pedal edema. Gastrointestinal system: Abdomen is nondistended, soft and nontender. Normal bowel sounds heard. Central nervous system: Alert and oriented. No focal neurological deficits. Extremities: Bilateral swollen feet with neuropathic changes and deformities. Sleeve on left knee, less edema noted  Data Reviewed: Basic Metabolic Panel:  Recent Labs Lab 04/20/17 1502 04/21/17 0531 04/21/17 0628 04/22/17 0524 04/23/17 0523 04/24/17 0545  NA 131*  --  130* 131* 132* 131*  K 4.5  --  4.7 4.1 3.6 3.7  CL 96*  --  97* 96* 97* 96*  CO2 19*  --  19* 24 25 23   GLUCOSE 105*  --  100* 107* 98 92  BUN 82*  --  87* 60* 42* 48*  CREATININE 6.46*  --  6.70* 5.34* 4.54* 5.24*  CALCIUM 9.4  --  8.9 9.0 9.2 9.0  MG  --  2.0  --  1.9 1.9  --   PHOS  --   --  8.4* 6.6* 5.6*  --    Liver Function Tests:  Recent Labs Lab 04/20/17 1502 04/21/17 0628  AST 13*  --   ALT 7*  --   ALKPHOS 62  --   BILITOT 1.1  --   PROT 6.7  --   ALBUMIN 3.7 3.2*   No results for input(s): LIPASE, AMYLASE in the last 168 hours.  Recent Labs Lab 04/20/17 1458  AMMONIA 23   CBC:  Recent Labs Lab 04/20/17 1502 04/21/17 0531 04/22/17 0524 04/23/17 0523 04/24/17 0545  WBC 4.3 3.9* 3.3* 3.1* 3.4*  NEUTROABS 3.0  --   --   --   --   HGB 9.6* 8.2* 8.8* 8.4* 8.1*  HCT 29.3* 26.0* 27.3* 26.9* 25.0*  MCV 93.0 93.5 95.1 94.4 94.0  PLT 138* 129* 132* 140* 126*   Cardiac Enzymes:  Recent Labs Lab 04/20/17 1502 04/20/17 2318  TROPONINI  0.05* 0.05*   CBG (last 3)   Recent Labs  04/23/17 2149 04/24/17 1046 04/24/17 1214  GLUCAP 150* 138* 115*   Recent Results (from the past 240 hour(s))  MRSA PCR Screening  Status: None   Collection Time: 04/20/17  8:07 PM  Result Value Ref Range Status   MRSA by PCR NEGATIVE NEGATIVE Final    Comment:        The GeneXpert MRSA Assay (FDA approved for NASAL specimens only), is one component of a comprehensive MRSA colonization surveillance program. It is not intended to diagnose MRSA infection nor to guide or monitor treatment for MRSA infections.     Studies: No results found. Scheduled Meds: . aspirin EC  81 mg Oral Daily  . calcitRIOL  1.5 mcg Oral Q T,Th,Sa-HD  . calcitRIOL  1.5 mcg Oral Once in dialysis  . [START ON 04/29/2017] darbepoetin (ARANESP) injection - DIALYSIS  200 mcg Intravenous Q Sat-HD  . enoxaparin  30 mg Subcutaneous Q24H  . escitalopram  10 mg Oral Daily  . feeding supplement (NEPRO CARB STEADY)  237 mL Oral Q1500  . folic acid  1 mg Oral BID  . insulin aspart  0-9 Units Subcutaneous TID WC  . mometasone-formoterol  2 puff Inhalation BID  . pantoprazole  40 mg Oral Daily  . pneumococcal 23 valent vaccine  0.5 mL Intramuscular Tomorrow-1000  . polyethylene glycol  17 g Oral Daily  . pregabalin  75 mg Oral BID  . senna-docusate  1 tablet Oral BID  . sevelamer carbonate  800 mg Oral TID WC  . traZODone  50 mg Oral QHS   Continuous Infusions: . sodium chloride    . sodium chloride    . ferric gluconate (FERRLECIT/NULECIT) IV Stopped (04/22/17 1050)    Principal Problem:   Acute metabolic encephalopathy Active Problems:   ESRD (end stage renal disease) (HCC)   Gait instability   Frequent falls   Pacemaker   A-fib (HCC)   Systolic heart failure (HCC)   DM (diabetes mellitus), type 2 with renal complications (HCC)   Essential hypertension   Hyponatremia   NAFLD (nonalcoholic fatty liver disease)   Thrombocytopenia (HCC)   Secondary  hyperparathyroidism of renal origin (HCC)   Hyperphosphatemia   Aortic stenosis, moderate-severe   Anemia in chronic kidney disease (CKD)   Palliative care by specialist  Time spent:   Standley Dakins, MD, FAAFP [facility] Pager [phone]  If 7PM-7AM, please contact night-coverage [email or web address] Password [email or web address] 04/24/2017, 2:42 PM    LOS: 4 days

## 2017-04-24 NOTE — Progress Notes (Signed)
[facility] Progress Note   Subjective:  Seen in room. Denies CP or dyspnea. Feels better today, ate her breakfast. Per palliative care note, plan is for outpt eval for sleep apnea and PT evaluation to make sure she is safe to go home.  Objective Vitals:   04/23/17 1715 04/23/17 1900 04/23/17 2100 04/24/17 0605  BP: (!) 108/58  96/65 103/61  Pulse: 61  60 60  Resp: 18  14 17   Temp: 98 F (36.7 C)  97.6 F (36.4 C) 98 F (36.7 C)  TempSrc: Oral  Oral Oral  SpO2: 98% 98% 99% 99%  Weight:   94 kg (207 lb 3.7 oz)   Height:       Physical Exam General: Elderly female, NAD. + L facial bruising. Heart: RRR; 3/6 systolic murmur Lungs: Faint inspiratory rales LLL, otherwise clear Extremities: No LE edema Dialysis Access: LUE AVF + thrill  Additional Objective Labs: Basic Metabolic Panel:  Recent Labs Lab 04/21/17 0628 04/22/17 0524 04/23/17 0523 04/24/17 0545  NA 130* 131* 132* 131*  K 4.7 4.1 3.6 3.7  CL 97* 96* 97* 96*  CO2 19* 24 25 23   GLUCOSE 100* 107* 98 92  BUN 87* 60* 42* 48*  CREATININE 6.70* 5.34* 4.54* 5.24*  CALCIUM 8.9 9.0 9.2 9.0  PHOS 8.4* 6.6* 5.6*  --    Liver Function Tests:  Recent Labs Lab 04/20/17 1502 04/21/17 0628  AST 13*  --   ALT 7*  --   ALKPHOS 62  --   BILITOT 1.1  --   PROT 6.7  --   ALBUMIN 3.7 3.2*   CBC:  Recent Labs Lab 04/20/17 1502 04/21/17 0531 04/22/17 0524 04/23/17 0523 04/24/17 0545  WBC 4.3 3.9* 3.3* 3.1* 3.4*  NEUTROABS 3.0  --   --   --   --   HGB 9.6* 8.2* 8.8* 8.4* 8.1*  HCT 29.3* 26.0* 27.3* 26.9* 25.0*  MCV 93.0 93.5 95.1 94.4 94.0  PLT 138* 129* 132* 140* 126*   Cardiac Enzymes:  Recent Labs Lab 04/20/17 1502 04/20/17 2318  TROPONINI 0.05* 0.05*   CBG:  Recent Labs Lab 04/22/17 2132 04/23/17 0717 04/23/17 1133 04/23/17 1639 04/23/17 2149  GLUCAP 110* 97 120* 107* 150*   Studies/Results: No results found. Medications: . sodium chloride    . sodium chloride    .  ferric gluconate (FERRLECIT/NULECIT) IV Stopped (04/22/17 1050)   . aspirin EC  81 mg Oral Daily  . calcitRIOL  1.5 mcg Oral Q T,Th,Sa-HD  . calcitRIOL  1.5 mcg Oral Once in dialysis  . [START ON 04/29/2017] darbepoetin (ARANESP) injection - DIALYSIS  200 mcg Intravenous Q Sat-HD  . enoxaparin  30 mg Subcutaneous Q24H  . escitalopram  10 mg Oral Daily  . feeding supplement (NEPRO CARB STEADY)  237 mL Oral Q1500  . folic acid  1 mg Oral BID  . insulin aspart  0-9 Units Subcutaneous TID WC  . mometasone-formoterol  2 puff Inhalation BID  . pantoprazole  40 mg Oral Daily  . pneumococcal 23 valent vaccine  0.5 mL Intramuscular Tomorrow-1000  . polyethylene glycol  17 g Oral Daily  . pregabalin  75 mg Oral BID  . senna-docusate  1 tablet Oral BID  . sevelamer carbonate  800 mg Oral TID WC  . traZODone  50 mg Oral QHS    Dialysis Orders: [place] TTS EDW 94 kg 4 hrs Bath 2K 2.25 Ca,Dialyzer F180 BFR 450Heparin 5000 u bolusAccess L RC AVF.  Mircera 200 mcg q 2 weeks (last given 04/04/17) Venofer 50 mg q week (last given 04/06/17) Calcitriol 1.5 mcg TIW  Assessment/Plan: 1. S/p mechanical fall (recurrent issue): Possibly related to uremia, Hx non-compliance. Recent L hip Fx 01/2017. PT eval pending today. Palliative care consulted to help establish goals of care, refusing SNF placement. Must be able to sit in a wheelchair and also sit up in a chair for 4 hours before dc home or to SNF.  Have d/w PT.  2. ESRD: Continue HD per TTS schedule, next 8/7. 3. HTN/volume: BP low side, seems to be close to EDW at this time. No BP meds. Monitor. 4. Anemia: Hgb 8.1, trending down. Getting course of IV iron while here. S/p Aranesp 200mcg IV on 04/22/17. 5. Secondary hyperparathyroidism: Phos improving, Ca ok. Continue Renvela, VDRA. Often refuses meds at home. 6. Nutrition: Alb 3.2, continue Nepro. 7. Depression: Continue trazodone/escitalopram. Per primary. 8. Type 2 DM: SSI only while here, glucose  fine here. 9. Aortic stenosis: EF 55-60%; no syncope this admit per notes. 10. A-fib: Not anticoagulated. 11. NAFLD with ascites: For possible paracentesis prior to discharge.  Ozzie HoyleKatie Stovall, PA-C 04/24/2017, 9:24 AM  [facility] Pager: [phone]  Pt seen, examined and agree w A/P as above.  Vinson Moselleob Tiegan Jambor MD [facility] pager [phone]   04/24/2017, 11:22 AM

## 2017-04-24 NOTE — Care Management Important Message (Signed)
Important Message  Patient Details  Name: Nance PearCarol Lasch MRN: [id number] Date of Birth: 09/26/1945   Medicare Important Message Given:  Yes    Khyle Goodell 04/24/2017, 12:08 PM

## 2017-04-24 NOTE — Progress Notes (Signed)
Physical Therapy Treatment Patient Details Name: Lindsey Sutton Dani MRN: [id number] DOB: 12/22/1945 Today's Date: 04/24/2017    History of Present Illness Pt is a 71 yo female admitted through ED on 04/20/17 following multiple falls at home and inability to get up from a seated position without Max A. Pt missed several days of dialysis and developed significant weakness. Pt fell out of the bed resulting in bruising on chin and L shoulder and fell in the shower with bruising in her back. Pt was diagnosed with metabolic encephalopathy, metabolic acidosis due to toxicity. PMH significant for ESRDon HD, A-fib, CKD, DM2, hip fx 01/19/17 with ORIF with IM nail on L, NAFLD, obesity, pacemaker, sysolic CHF, thrombocytopenia.     PT Comments    Pt presented awake and alert supine in bed and ready to participate. Pt was mod to max assist +2 in all transfers. Pt was educated on how to perform lateral scoot transfer and given option to use the slide board in the future. Pt educated on benefits of rehab at SNF vs home. Pt will benefit from continued physical therapy services for increased mobility, safety, and fall prevention education.   Follow Up Recommendations  SNF;Supervision/Assistance - 24 hour     Equipment Recommendations  None recommended by PT (If pt d/c to home - consider hospital bed)    Recommendations for Other Services       Precautions / Restrictions Precautions Precautions: Fall Precaution Comments: multiple falls prior to admission Restrictions Weight Bearing Restrictions: No    Mobility  Bed Mobility Overal bed mobility: Needs Assistance Bed Mobility: Supine to Sit     Supine to sit: Mod assist     General bed mobility comments: Able to bring LEs off bed, needed assistance lifting upper body off bed  Transfers Overall transfer level: Needs assistance   Transfers: Sit to/from Stand;Lateral/Scoot Transfers Sit to Stand: Max assist;+2 physical assistance        Lateral/Scoot Transfers: Mod assist;+2 physical assistance General transfer comment: Required +2 assist to power up to standing. Prefers to put hands on Stedy during transfers, cued for hand placement  Ambulation/Gait                 Stairs            Wheelchair Mobility    Modified Rankin (Stroke Patients Only)       Balance Overall balance assessment: Needs assistance Sitting-balance support: No upper extremity supported;Feet supported Sitting balance-Leahy Scale: Good Sitting balance - Comments: Able to maintain seated balance and perform lateral leans   Standing balance support: Single extremity supported Standing balance-Leahy Scale: Poor Standing balance comment: UEs supported by Nyoka CowdenStedy                            Cognition Arousal/Alertness: Awake/alert Behavior During Therapy: WFL for tasks assessed/performed Overall Cognitive Status: Within Functional Limits for tasks assessed                                        Exercises General Exercises - Lower Extremity Ankle Circles/Pumps: AROM;Both;10 reps;Supine Quad Sets: AROM;Both;10 reps;Supine Straight Leg Raises: Both;10 reps;AROM;Supine Mini-Sqauts: 5 reps;AROM;Both;Standing (While standing on Stedy with BUEs supported)    General Comments        Pertinent Vitals/Pain Pain Assessment: No/denies pain    Home Living  Prior Function            PT Goals (current goals can now be found in the care plan section) Acute Rehab PT Goals Patient Stated Goal: to be able to get up again and walk Progress towards PT goals: Progressing toward goals    Frequency    Min 2X/week      PT Plan Current plan remains appropriate    Co-evaluation              AM-PAC PT "6 Clicks" Daily Activity  Outcome Measure  Difficulty turning over in bed (including adjusting bedclothes, sheets and blankets)?: Total Difficulty moving from lying on  back to sitting on the side of the bed? : Total Difficulty sitting down on and standing up from a chair with arms (e.g., wheelchair, bedside commode, etc,.)?: Total Help needed moving to and from a bed to chair (including a wheelchair)?: Total Help needed walking in hospital room?: Total Help needed climbing 3-5 steps with a railing? : Total 6 Click Score: 6    End of Session Equipment Utilized During Treatment: Gait belt Activity Tolerance: Patient tolerated treatment well Patient left: in chair;with call bell/phone within reach Nurse Communication: Mobility status (d/c planning) PT Visit Diagnosis: Unsteadiness on feet (R26.81);Muscle weakness (generalized) (M62.81);Difficulty in walking, not elsewhere classified (R26.2);Pain;History of falling (Z91.81);Repeated falls (R29.6)     Time: 1130-1208 (Minus approx 5 min finding appropriate recliner) PT Time Calculation (min) (ACUTE ONLY): 38 min  Charges:  $Therapeutic Exercise: 8-22 mins $Therapeutic Activity: 8-22 mins                    G Codes:       Jackqulyn Livings, SPT Acute Rehabilitation Services Office: [phone]   Jackqulyn Livings 04/24/2017, 1:55 PM

## 2017-04-25 LAB — CBC
HCT: 26 % — ABNORMAL LOW (ref 36.0–46.0)
Hemoglobin: 8.3 g/dL — ABNORMAL LOW (ref 12.0–15.0)
MCH: 30 pg (ref 26.0–34.0)
MCHC: 31.9 g/dL (ref 30.0–36.0)
MCV: 93.9 fL (ref 78.0–100.0)
PLATELETS: 131 10*3/uL — AB (ref 150–400)
RBC: 2.77 MIL/uL — AB (ref 3.87–5.11)
RDW: 16.2 % — AB (ref 11.5–15.5)
WBC: 3.1 10*3/uL — AB (ref 4.0–10.5)

## 2017-04-25 LAB — RENAL FUNCTION PANEL
ALBUMIN: 3.1 g/dL — AB (ref 3.5–5.0)
Anion gap: 13 (ref 5–15)
BUN: 55 mg/dL — AB (ref 6–20)
CHLORIDE: 96 mmol/L — AB (ref 101–111)
CO2: 23 mmol/L (ref 22–32)
CREATININE: 5.74 mg/dL — AB (ref 0.44–1.00)
Calcium: 9.1 mg/dL (ref 8.9–10.3)
GFR, EST AFRICAN AMERICAN: 8 mL/min — AB (ref >60)
GFR, EST NON AFRICAN AMERICAN: 7 mL/min — AB (ref >60)
Glucose, Bld: 95 mg/dL (ref 65–99)
Phosphorus: 6.6 mg/dL — ABNORMAL HIGH (ref 2.5–4.6)
Potassium: 3.6 mmol/L (ref 3.5–5.1)
Sodium: 132 mmol/L — ABNORMAL LOW (ref 135–145)

## 2017-04-25 LAB — GLUCOSE, CAPILLARY
GLUCOSE-CAPILLARY: 106 mg/dL — AB (ref 65–99)
GLUCOSE-CAPILLARY: 105 mg/dL — AB (ref 65–99)
GLUCOSE-CAPILLARY: 159 mg/dL — AB (ref 65–99)

## 2017-04-25 MED ORDER — OXYCODONE HCL 5 MG PO TABS: 5.0000 mg | ORAL_TABLET | ORAL | 0 refills | Status: DC | PRN

## 2017-04-25 MED ORDER — LINAGLIPTIN 5 MG PO TABS: 5.0000 mg | ORAL_TABLET | Freq: Every day | ORAL | Status: DC

## 2017-04-25 MED ORDER — ESCITALOPRAM OXALATE 10 MG PO TABS: 10.0000 mg | ORAL_TABLET | Freq: Every day | ORAL | Status: AC

## 2017-04-25 MED ORDER — ALBUTEROL SULFATE (2.5 MG/3ML) 0.083% IN NEBU: 2.5000 mg | INHALATION_SOLUTION | RESPIRATORY_TRACT | 12 refills | Status: AC | PRN

## 2017-04-25 MED ORDER — NEPRO/CARBSTEADY PO LIQD: 237.0000 mL | Freq: Every day | ORAL | 0 refills | Status: AC

## 2017-04-25 MED ORDER — CALCITRIOL 0.5 MCG PO CAPS
ORAL_CAPSULE | ORAL | Status: AC
  Filled 2017-04-25: qty 3

## 2017-04-25 MED ORDER — POLYETHYLENE GLYCOL 3350 17 G PO PACK: 17.0000 g | PACK | Freq: Every day | ORAL | 0 refills | Status: AC

## 2017-04-25 MED ORDER — FOLIC ACID 1 MG PO TABS: 1.0000 mg | ORAL_TABLET | Freq: Every day | ORAL | Status: AC

## 2017-04-25 NOTE — Clinical Social Work Placement (Addendum)
   CLINICAL SOCIAL WORK PLACEMENT  NOTE 04/25/17 - DISCHARGED TO [facility] [place] [facility]  Date:  04/25/2017  Patient Details  Name: Lindsey Sutton MRN: [id number] Date of Birth: 03/30/1946  Clinical Social Work is seeking post-discharge placement for this patient at the Skilled  Nursing Facility level of care (*CSW will initial, date and re-position this form in  chart as items are completed):  No (Patient and daughters provided CSW with facility preference)   Patient/family provided with [facility] Clinical Social Work Department's list of facilities offering this level of care within the geographic area requested by the patient (or if unable, by the patient's family).  Yes   Patient/family informed of their freedom to choose among providers that offer the needed level of care, that participate in Medicare, Medicaid or managed care program needed by the patient, have an available bed and are willing to accept the patient.  No   Patient/family informed of [facility]'s ownership interest in [facility] and [facility], as well as of the fact that they are under no obligation to receive care at these facilities.  PASRR submitted to EDS on       PASRR number received on       Existing PASRR number confirmed on 04/24/17     FL2 transmitted to all facilities in geographic area requested by pt/family on 04/24/17     FL2 transmitted to all facilities within larger geographic area on       Patient informed that his/her managed care company has contracts with or will negotiate with certain facilities, including the following:        Yes   Patient/family informed of bed offers received.  Patient chooses bed at [facility] [place]     Physician recommends and patient chooses bed at      Patient to be transferred to [facility] [place] on  04/25/17.  Patient to be transferred to facility by  ambulance     Patient family notified on  04/25/17 of transfer.  Name of  family member notified:   Attempted to reach daughter Bjorn LoserRhonda while in room with patient and no answer. Patient also tried and no answer. [name] said that her daughter will call her.     PHYSICIAN       Additional Comment:    _______________________________________________ Cristobal Goldmannrawford, Luciana Cammarata Bradley, LCSW 04/25/2017, 3:54 PM

## 2017-04-25 NOTE — Clinical Social Work Note (Signed)
Clinical Social Work Assessment  Patient Details  Name: Lindsey Sutton MRN: [id number] Date of Birth: 10/01/1945  Date of referral:  04/23/17               Reason for consult:  Facility Placement                Permission sought to share information with:  Family Supports Permission granted to share information::  Yes, Verbal Permission Granted  Name::     Lindsey Sutton and Lindsey SchimkeLisa Sutton  Agency::     Relationship::  Daughters  Contact Information:  Lindsey LoserRhonda [phone] and Lindsey StanleyLisa [phone]  Housing/Transportation Living arrangements for the past 2 months:  Single Family Home Source of Information:  Patient, Adult Children Patient Interpreter Needed:  None Criminal Activity/Legal Involvement Pertinent to Current Situation/Hospitalization:  No - Comment as needed Significant Relationships:  Adult Children Lives with:  Self Do you feel safe going back to the place where you live?  No (Patient in agreement with ST rehab) Need for family participation in patient care:  Yes (Comment) (Daughters very involved)  Care giving concerns:  Patient initially adamant about going home but she lives alone and finally agreed that going to rehab is the best plan for her to get stronger and receive the help she needs.    Social Worker assessment / plan: On 04/24/17 CSW talked with patient and daughters at the bedside after being advised that patient now in agreement with ST rehab. [name] was sitting up in bed and was alert, oriented and engaged with CSW. Daughters were very interactive with CSW and happy that their mother finally decided to go to rehab. CSW informed of desired facility -[facility] [place] as patient as been there before and they are aware that her HD Center will have to be temporarily changed while in rehab. CSW talked very plainly with patient regarding the importance of going to HD on schedule and participating in rehab at the facility and the consequences if she does not  comply. Daughters joined in and patient expressed understanding and agreed to comply.    Employment status:  Retired Engineer, miningnsurance information:  Medicare, Managed Care PT Recommendations:  Skilled Nursing Facility Information / Referral to community resources:  Skilled Nursing Facility (CSW provided with facility preference)  Patient/Family's Response to care:  No concerns expressed regarding care during hospitalization.  Patient/Family's Understanding of and Emotional Response to Diagnosis, Current Treatment, and Prognosis: Talked with patient regarding the ramifications physically and medically of not being compliant with dialysis and [name] expressed understanding.  Emotional Assessment Appearance:  Appears stated age Attitude/Demeanor/Rapport:  Other (Appropriate) Affect (typically observed):  Appropriate, Pleasant Orientation:  Oriented to Self, Oriented to Place, Oriented to  Time, Oriented to Situation Alcohol / Substance use:  Tobacco Use, Alcohol Use, Illicit Drugs (Patient reported that she quit smoking and does not drink or use illicit drugs.) Psych involvement (Current and /or in the community):  No (Comment)  Discharge Needs  Concerns to be addressed:  Discharge Planning Concerns Readmission within the last 30 days:  No Current discharge risk:  None Barriers to Discharge:  No Barriers Identified   Cristobal GoldmannCrawford, Tamyka Bezio Bradley, LCSW 04/25/2017, 3:43 PM

## 2017-04-25 NOTE — Progress Notes (Signed)
[facility] Progress Note   Subjective: has been getting OOB w/ PT, sitting in chair.  Agrees to SNF now, says she is going today.   Objective Vitals:   04/24/17 2019 04/24/17 2048 04/25/17 0551 04/25/17 0820  BP:  (!) 107/57 (!) 122/59 117/65  Pulse:  60 61 60  Resp:  18 17 16   Temp:  97.8 F (36.6 C) 97.8 F (36.6 C) (!) 97.5 F (36.4 C)  TempSrc:  Oral Oral Oral  SpO2: 96% 97% 93% 95%  Weight:  94.3 kg (207 lb 14.3 oz)  94.3 kg (207 lb 14.3 oz)  Height:       Physical Exam General: Elderly female, NAD. + L facial bruising. Heart: RRR; 3/6 systolic murmur Lungs: Faint inspiratory rales LLL, otherwise clear Extremities: No LE edema Dialysis Access: LUE AVF + thrill  Additional Objective Labs: Basic Metabolic Panel:  Recent Labs Lab 04/21/17 0628 04/22/17 0524 04/23/17 0523 04/24/17 0545  NA 130* 131* 132* 131*  K 4.7 4.1 3.6 3.7  CL 97* 96* 97* 96*  CO2 19* 24 25 23   GLUCOSE 100* 107* 98 92  BUN 87* 60* 42* 48*  CREATININE 6.70* 5.34* 4.54* 5.24*  CALCIUM 8.9 9.0 9.2 9.0  PHOS 8.4* 6.6* 5.6*  --    Liver Function Tests:  Recent Labs Lab 04/20/17 1502 04/21/17 0628  AST 13*  --   ALT 7*  --   ALKPHOS 62  --   BILITOT 1.1  --   PROT 6.7  --   ALBUMIN 3.7 3.2*   CBC:  Recent Labs Lab 04/20/17 1502 04/21/17 0531 04/22/17 0524 04/23/17 0523 04/24/17 0545 04/25/17 0915  WBC 4.3 3.9* 3.3* 3.1* 3.4* 3.1*  NEUTROABS 3.0  --   --   --   --   --   HGB 9.6* 8.2* 8.8* 8.4* 8.1* 8.3*  HCT 29.3* 26.0* 27.3* 26.9* 25.0* 26.0*  MCV 93.0 93.5 95.1 94.4 94.0 93.9  PLT 138* 129* 132* 140* 126* 131*   Cardiac Enzymes:  Recent Labs Lab 04/20/17 1502 04/20/17 2318  TROPONINI 0.05* 0.05*   CBG:  Recent Labs Lab 04/24/17 1046 04/24/17 1214 04/24/17 1655 04/24/17 2025 04/25/17 0747  GLUCAP 138* 115* 110* 110* 106*   Studies/Results: No results found. Medications: . sodium chloride    . sodium chloride    . ferric gluconate  (FERRLECIT/NULECIT) IV Stopped (04/22/17 1050)   . aspirin EC  81 mg Oral Daily  . calcitRIOL  1.5 mcg Oral Q T,Th,Sa-HD  . calcitRIOL  1.5 mcg Oral Once in dialysis  . [START ON 04/29/2017] darbepoetin (ARANESP) injection - DIALYSIS  200 mcg Intravenous Q Sat-HD  . enoxaparin  30 mg Subcutaneous Q24H  . escitalopram  10 mg Oral Daily  . feeding supplement (NEPRO CARB STEADY)  237 mL Oral Q1500  . folic acid  1 mg Oral BID  . insulin aspart  0-9 Units Subcutaneous TID WC  . mometasone-formoterol  2 puff Inhalation BID  . pantoprazole  40 mg Oral Daily  . pneumococcal 23 valent vaccine  0.5 mL Intramuscular Tomorrow-1000  . polyethylene glycol  17 g Oral Daily  . pregabalin  75 mg Oral BID  . senna-docusate  1 tablet Oral BID  . sevelamer carbonate  800 mg Oral TID WC  . traZODone  50 mg Oral QHS    Dialysis Orders: [facility] TTS EDW 94 kg 4 hrs Bath 2K 2.25 Ca,Dialyzer F180 BFR 450Heparin 5000 u bolusAccess L RC AVF. Mircera  200 mcg q 2 weeks (last given 04/04/17) Venofer 50 mg q week (last given 04/06/17) Calcitriol 1.5 mcg TIW  Assessment/Plan: 1. S/p mechanical fall (recurrent issue): Possibly related to uremia, Hx non-compliance. Recent L hip Fx 01/2017. Working w PT and plan is for SNF placement , possibly going today.  2. ESRD: HD TTS 3. HTN/volume: BP low side, seems to be close to EDW at this time. No BP meds. Monitor. 4. Anemia: Hgb 8.1, trending down. Getting course of IV iron while here. S/p Aranesp 200mcg IV on 04/22/17. 5. Secondary hyperparathyroidism: Phos improving, Ca ok. Continue Renvela, VDRA. Often refuses meds at home. 6. Nutrition: Alb 3.2, continue Nepro. 7. Depression: Continue trazodone/escitalopram. Per primary. 8. Type 2 DM: SSI only while here, glucose fine here. 9. Aortic stenosis: EF 55-60%; no syncope this admit per notes. 10. A-fib: Not anticoagulated. 11. NAFLD with ascites  Vinson Moselleob Durrell Barajas MD [facility] pgr [phone]    04/25/2017, 9:40 AM      04/25/2017, 9:38 AM

## 2017-04-25 NOTE — Procedures (Signed)
Stable on HD, smiling and joking. Qb 400/ VP 180  I was present at this dialysis session, have reviewed the session itself and made  appropriate changes Vinson Moselleob Shontell Prosser MD [facility] pager [phone]   04/25/2017, 9:40 AM

## 2017-04-25 NOTE — Discharge Instructions (Signed)
° °  Follow with Primary MD  Pola CornBrown, Rita Harbison, NP  and other consultant's as instructed your Hospitalist MD  Please get a complete blood count and chemistry panel checked by your Primary MD at your next visit, and again as instructed by your Primary MD.  Get Medicines reviewed and adjusted: Please take all your medications with you for your next visit with your Primary MD  Laboratory/radiological data: Please request your Primary MD to go over all hospital tests and procedure/radiological results at the follow up, please ask your Primary MD to get all Hospital records sent to his/her office.  In some cases, they will be blood work, cultures and biopsy results pending at the time of your discharge. Please request that your primary care M.D. follows up on these results.  Also Note the following: If you experience worsening of your admission symptoms, develop shortness of breath, life threatening emergency, suicidal or homicidal thoughts you must seek medical attention immediately by calling 911 or calling your MD immediately  if symptoms less severe.  You must read complete instructions/literature along with all the possible adverse reactions/side effects for all the Medicines you take and that have been prescribed to you. Take any new Medicines after you have completely understood and accpet all the possible adverse reactions/side effects.   Do not drive when taking Pain medications or sleeping medications (Benzodaizepines)  Do not take more than prescribed Pain, Sleep and Anxiety Medications. It is not advisable to combine anxiety,sleep and pain medications without talking with your primary care practitioner  Special Instructions: If you have smoked or chewed Tobacco  in the last 2 yrs please stop smoking, stop any regular Alcohol  and or any Recreational drug use.  Wear Seat belts while driving.  Please note: You were cared for by a hospitalist during your hospital stay. Once you are  discharged, your primary care physician will handle any further medical issues. Please note that NO REFILLS for any discharge medications will be authorized once you are discharged, as it is imperative that you return to your primary care physician (or establish a relationship with a primary care physician if you do not have one) for your post hospital discharge needs so that they can reassess your need for medications and monitor your lab values.

## 2017-04-25 NOTE — Progress Notes (Signed)
Patient being discharged to [facility],report called ,and given to Eugenio HoesMichelle Basil RN. Vital signs stable. IV discontinued,catheter intact. Awaiting for transportation to facility.

## 2017-04-25 NOTE — Discharge Summary (Addendum)
Physician Discharge Summary  Lindsey Sutton ZOX:[id number] DOB: March 09, 1946 DOA: 04/20/2017  PCP: [name], NP  Admit date: 04/20/2017 Discharge date: 04/25/2017  Admitted From: Home  Disposition: SNF  Recommendations for Outpatient Follow-up:  1. Please resume regular hemodialysis scheduled Tues, Thur, Sat 2. Follow up with Dr. Roda Shutters orthopedics on 04/28/17 at 0845 as scheduled. 3. Consider titrating up lexapro to 20 mg over next 1-2 weeks.  4. PT evaluation recommended 5. Fall precautions 6. Monitor blood glucose 1-2 times per day  Discharge Condition: STABLE   CODE STATUS: FULL   Brief Hospitalization Summary: Please see all hospital notes, images, labs for full details of the hospitalization.  HPI: Lindsey Sutton is a 71 y.o. female with ESRD on HD T, R, Sat, missed several days of treatment apparently has been very weak and not acting herself for last several days.  She had a major fall at home today and injured her knees.  She reportedly fell out of bed this morning while sleeping.  She hit her head/face on nightstand and knees on floor.  She has bruising to face but left knee pain and left hip pain. She does have a history of recent hip replacement.  She denies LOC.  Her daughter says that patient has not been herself lately.  She says that her last HD session was 5 days ago.  Her daughter says that she has been so unsteady on her feet that she can not ambulate safely without falling.  She has no weakness in extremities, no slurred speech, no chest pain and no SOB.  Pt has a long history of noncompliance with renal medications and hemodialysis.  She basically has hemodialysis twice per week and usually misses the other treatments.  There have been times where she misses HD for a full week.  She says that she wants to live but has been dealing with depression from the recent loss of her husband and having been relocated to [facility] from [place].  The primary problem now is that she can't  stand and she is too weak to get up from a chair.  This per family is new and they are concerned about this.  She has been in SNF rehab before but has been discharged due to noncompliance with hemodialysis and has been caught throwing medications in the trash per family and patient confirms this.    ED course: Pt was seen and had xrays done to rule out acute fracture.  She had labs done with some electrolyte abnormalities noted and nephrology consultant recommending admission for hemodialysis treatment and evaluation of frequent falls.    Brief Admission Hx: Lindsey Sutton a 71 y.o.femalewith ESRD on HD T, R, Sat, missed several days of treatment apparently has been very weak and not acting herself for last several days  MDM/Assessment & Plan:   1. Metabolic encephalopathy - much improved after HD.  likely secondary to uremia from missed HD treatments, Admitted for hemodialysis, consulted nephrology and they provided HD catch up.  Monitor electrolytes and telemetry. Doing much better now.  2. Metabolic acidosis - likely due to uremia, resolved with HD. 3. ESRD on HD with long history of noncompliance - Pt admits that she doesn't take her renal meds regularly and does not go to HD regularly. She will be admitted to have catch up hemodialysis. Unfortunately given her long history of non compliance it is unlikely that she will change her behavior although she says she wants to live and family is interested  in discussing renal transplant. I explained to them that given her advanced age and well documented medication noncompliance she probably would not be a candidate for renal transplant.  4. Frequent falls, gait instability - This is a complex issue and there could be multiple reasons for this. For now, will order Fall precautions, PT/OT recommending SNF but patient doesn't want to go, may have to go home with family and home health services.  I am going to ask palliative medicine to help us  establish some goals of care for patient because she is refusing services and care and not sure what longterm plan is going to be.  5. Left knee sprain - improved after sleeve placed.  tylenol for pain.  6. Chronic pain/opioid dependence - Family requested to be judicious with pain meds as patient has tendency to become very sedated on pain meds. oxycodone for severe pain only.   7. Generalized weakness - multifactorial given anemia, metabolic derangements and chronic comorbidities, will need to follow carefully and treat what can be treated. Pt needs SNF and has agreed to go.   8. Liver Cirrhosis - family says that this has been stable over past years, her LFTs appear normal today, will monitor. Watch tylenol use.     9. DM type 2 - Start trajenta 5 mg po daily.   10. Chronic afib chadvasc 4 not on anticoagulation, but received pacemaker.  11. Anemia of CKD - Hg 9.6, follow closely.  12. Depression - increased lexapro from 5 mg to 10 mg. Consider titrating up to 20 mg over next 1-2 weeks and /or psychiatry referral.   13. Hyponatremia - volume overloaded, should improve with hemodialysis and fluid removal.  14. Thrombocytopenia - follow, no bleeding.  15. AS - moderate - severe - Pt had Echo in May 2018: LVEF 55-60, severe LVH, mod-sev AS, severe biatrial enlargement, mod TR, dil IVC.  16. Hyperphos - Improving per nephrology team.   DVT Prophylaxis:lovenox Code Status:full  Family Communication:daughters Disposition Plan:SNF  Consultants:  nephrology  Procedures:  Hemodialysis   Discharge Diagnoses:  Principal Problem:   Acute metabolic encephalopathy Active Problems:   ESRD (end stage renal disease) (HCC)   Gait instability   Frequent falls   Pacemaker   A-fib (HCC)   Systolic heart failure (HCC)   DM (diabetes mellitus), type 2 with renal complications (HCC)   Essential hypertension   Hyponatremia   NAFLD (nonalcoholic fatty liver disease)    Thrombocytopenia (HCC)   Secondary hyperparathyroidism of renal origin (HCC)   Hyperphosphatemia   Aortic stenosis, moderate-severe   Anemia in chronic kidney disease (CKD)   Palliative care by specialist  Discharge Instructions:  Allergies as of 04/25/2017      Reactions   Penicillins    Has patient had a PCN reaction causing immediate rash, facial/tongue/throat swelling, SOB or lightheadedness with hypotension: Yes Has patient had a PCN reaction causing severe rash involving mucus membranes or skin necrosis: No Has patient had a PCN reaction that required hospitalization No Has patient had a PCN reaction occurring within the last 10 years: No If all of the above answers are "NO", then may proceed with Cephalosporin use.      Medication List    TAKE these medications   acetaminophen 650 MG CR tablet Commonly known as:  TYLENOL Take 650 mg by mouth every 8 (eight) hours as needed for pain.   albuterol (2.5 MG/3ML) 0.083% nebulizer solution Commonly known as:  PROVENTIL Take 3 mLs (2.5  mg total) by nebulization every 4 (four) hours as needed for wheezing or shortness of breath.   aspirin EC 81 MG tablet Take 81 mg by mouth daily.   budesonide-formoterol 160-4.5 MCG/ACT inhaler Commonly known as:  SYMBICORT Inhale 2 puffs into the lungs 2 (two) times daily.   escitalopram 10 MG tablet Commonly known as:  LEXAPRO Take 1 tablet (10 mg total) by mouth daily. What changed:  medication strength  how much to take   feeding supplement (NEPRO CARB STEADY) Liqd Take 237 mLs by mouth daily at 3 pm.   folic acid 1 MG tablet Commonly known as:  FOLVITE Take 1 tablet (1 mg total) by mouth daily. What changed:  when to take this   linagliptin 5 MG Tabs tablet Commonly known as:  TRADJENTA Take 1 tablet (5 mg total) by mouth daily.   oxyCODONE 5 MG immediate release tablet Commonly known as:  Oxy IR/ROXICODONE Take 1 tablet (5 mg total) by mouth every 4 (four) hours as needed  for severe pain.   polyethylene glycol packet Commonly known as:  MIRALAX / GLYCOLAX Take 17 g by mouth daily.   pregabalin 75 MG capsule Commonly known as:  LYRICA Take 75 mg by mouth 2 (two) times daily.   RENVELA 800 MG tablet Generic drug:  sevelamer carbonate Take 800 mg by mouth 3 (three) times daily with meals.   senna-docusate 8.6-50 MG tablet Commonly known as:  Senokot-S Take 1 tablet by mouth 2 (two) times daily.   SENSIPAR 30 MG tablet Generic drug:  cinacalcet Take 30 mg by mouth daily with supper.   traZODone 50 MG tablet Commonly known as:  DESYREL Take 50 mg by mouth at bedtime.      Follow-up Information    [name], NP. Schedule an appointment as soon as possible for a visit in 2 week(s).   Specialty:  Nurse Practitioner Contact information: [address] [place] [address] [phone]        Hemodialysis Follow up.   Why:  Resume regular hemodialysis schedule Tues Thurs Sat       Tarry Kos, MD Follow up on 04/28/2017.   Specialty:  Orthopedic Surgery Why:  0845am as scheduled  Contact information: [address] [place] [address] [phone]          Allergies  Allergen Reactions  . Penicillins     Has patient had a PCN reaction causing immediate rash, facial/tongue/throat swelling, SOB or lightheadedness with hypotension: Yes Has patient had a PCN reaction causing severe rash involving mucus membranes or skin necrosis: No Has patient had a PCN reaction that required hospitalization No Has patient had a PCN reaction occurring within the last 10 years: No If all of the above answers are "NO", then may proceed with Cephalosporin use.    Current Discharge Medication List    START taking these medications   Details  albuterol (PROVENTIL) (2.5 MG/3ML) 0.083% nebulizer solution Take 3 mLs (2.5 mg total) by nebulization every 4 (four) hours as needed for wheezing or shortness of breath. Qty: 75 mL,  Refills: 12    linagliptin (TRADJENTA) 5 MG TABS tablet Take 1 tablet (5 mg total) by mouth daily. Qty: 30 tablet    oxyCODONE (OXY IR/ROXICODONE) 5 MG immediate release tablet Take 1 tablet (5 mg total) by mouth every 4 (four) hours as needed for severe pain. Qty: 12 tablet, Refills: 0      CONTINUE these medications which have CHANGED  Details  escitalopram (LEXAPRO) 10 MG tablet Take 1 tablet (10 mg total) by mouth daily.    folic acid (FOLVITE) 1 MG tablet Take 1 tablet (1 mg total) by mouth daily.    Nutritional Supplements (FEEDING SUPPLEMENT, NEPRO CARB STEADY,) LIQD Take 237 mLs by mouth daily at 3 pm. Refills: 0    polyethylene glycol (MIRALAX / GLYCOLAX) packet Take 17 g by mouth daily. Qty: 14 each, Refills: 0      CONTINUE these medications which have NOT CHANGED   Details  aspirin EC 81 MG tablet Take 81 mg by mouth daily.    budesonide-formoterol (SYMBICORT) 160-4.5 MCG/ACT inhaler Inhale 2 puffs into the lungs 2 (two) times daily.    cinacalcet (SENSIPAR) 30 MG tablet Take 30 mg by mouth daily with supper.    pregabalin (LYRICA) 75 MG capsule Take 75 mg by mouth 2 (two) times daily.    senna-docusate (SENOKOT-S) 8.6-50 MG tablet Take 1 tablet by mouth 2 (two) times daily.    sevelamer carbonate (RENVELA) 800 MG tablet Take 800 mg by mouth 3 (three) times daily with meals.    traZODone (DESYREL) 50 MG tablet Take 50 mg by mouth at bedtime.    acetaminophen (TYLENOL) 650 MG CR tablet Take 650 mg by mouth every 8 (eight) hours as needed for pain.      STOP taking these medications     enoxaparin (LOVENOX) 30 MG/0.3ML injection      methocarbamol (ROBAXIN) 500 MG tablet      ondansetron (ZOFRAN) 4 MG tablet      oxyCODONE-acetaminophen (PERCOCET) 5-325 MG tablet      pantoprazole (PROTONIX) 40 MG tablet        Procedures/Studies: Dg Chest 2 View  Result Date: 04/20/2017 CLINICAL DATA:  Fall.  Dialysis patient EXAM: CHEST  2 VIEW COMPARISON:  Chest  radiograph 01/19/2017 FINDINGS: LEFT-sided pacemaker overlies stable enlarged cardiac silhouette. No effusion, infiltrate or pneumothorax. No evidence of fracture. IMPRESSION: Cardiomegaly without acute cardiopulmonary findings. Electronically Signed   By: Genevive Bi M.D.   On: 04/20/2017 16:33   Dg Knee Complete 4 Views Left  Result Date: 04/20/2017 CLINICAL DATA:  Knee pain.  Left hip pain EXAM: LEFT KNEE - COMPLETE 4+ VIEW COMPARISON:  01/19/2017 FINDINGS: Moderate joint effusion. Femoral nail with tip at the anterior cortex of the distal femur. No acute fracture or dislocation. Osteopenia and atherosclerotic calcification. IMPRESSION: 1. Joint effusion without acute osseous finding. 2. Femoral nail with tip near the ventral cortex. 3. Osteopenia and atherosclerosis. Electronically Signed   By: Marnee Spring M.D.   On: 04/20/2017 14:34   Dg Knee Complete 4 Views Right  Result Date: 04/20/2017 CLINICAL DATA:  Right knee pain since a fall yesterday getting out of bed. EXAM: RIGHT KNEE - COMPLETE 4+ VIEW COMPARISON:  None. FINDINGS: The patient has a large joint effusion. No fracture is identified. Bones are somewhat osteopenic. Moderate degenerative change about the knee is most notable in the patellofemoral and lateral compartments. Extensive atherosclerosis is noted. IMPRESSION: Large joint effusion.  No fracture is identified. Osteopenia. Patellofemoral and lateral compartment osteoarthritis. Extensive atherosclerosis. Electronically Signed   By: Drusilla Kanner M.D.   On: 04/20/2017 16:28   Dg Hip Unilat With Pelvis 2-3 Views Left  Result Date: 04/20/2017 CLINICAL DATA:  Left hip pain after recent fall. EXAM: DG HIP (WITH OR WITHOUT PELVIS) 2-3V LEFT COMPARISON:  Left hip x-rays dated Feb 14, 2017. FINDINGS: Evaluation is limited due to body habitus. Prior  left femur cephalomedullary rod fracture fixation with healing of the intertrochanteric fracture. No hardware complication. No new  fracture. Mild degenerative changes of the bilateral hip joints. Diffuse osteopenia. Aortoiliac atherosclerotic vascular disease. IMPRESSION: 1. No definite acute osseous abnormality. If there is persistent concern for left hip fracture, consider CT for further evaluation. 2. Healing intertrochanteric fracture status post ORIF. No evidence of hardware complication. Electronically Signed   By: Obie Dredge M.D.   On: 04/20/2017 14:37     Subjective: Pt seen in HD, says she feels good, she has agreed to go to SNF  Discharge Exam: Vitals:   04/25/17 1235 04/25/17 1329  BP: 128/68 122/70  Pulse: 61 66  Resp: 18 18  Temp: 97.7 F (36.5 C) 98.2 F (36.8 C)   Vitals:   04/25/17 1200 04/25/17 1229 04/25/17 1235 04/25/17 1329  BP: (!) 110/59 121/69 128/68 122/70  Pulse: 63 61 61 66  Resp:   18 18  Temp:   97.7 F (36.5 C) 98.2 F (36.8 C)  TempSrc:   Oral Oral  SpO2:   95% 96%  Weight:   93.4 kg (205 lb 14.6 oz)   Height:       General exam: awake, alert, NAD, chronically ill appearing.  Respiratory system: No increased work of breathing. Cardiovascular system: S1 & S2 heard, RRR. No JVD, murmurs, gallops, clicks or pedal edema. Gastrointestinal system: Abdomen is nondistended, soft and nontender. Normal bowel sounds heard. Central nervous system: Alert and oriented. No focal neurological deficits. Extremities: Bilateral swollen feet with neuropathic changes and deformities. Sleeve on left knee, less edema noted  The results of significant diagnostics from this hospitalization (including imaging, microbiology, ancillary and laboratory) are listed below for reference.    Microbiology: Recent Results (from the past 240 hour(s))  MRSA PCR Screening     Status: None   Collection Time: 04/20/17  8:07 PM  Result Value Ref Range Status   MRSA by PCR NEGATIVE NEGATIVE Final    Comment:        The GeneXpert MRSA Assay (FDA approved for NASAL specimens only), is one component of  a comprehensive MRSA colonization surveillance program. It is not intended to diagnose MRSA infection nor to guide or monitor treatment for MRSA infections.     Labs: BNP (last 3 results) No results for input(s): BNP in the last 8760 hours. Basic Metabolic Panel:  Recent Labs Lab 04/21/17 0531 04/21/17 0628 04/22/17 0524 04/23/17 0523 04/24/17 0545 04/25/17 0914  NA  --  130* 131* 132* 131* 132*  K  --  4.7 4.1 3.6 3.7 3.6  CL  --  97* 96* 97* 96* 96*  CO2  --  19* 24 25 23 23   GLUCOSE  --  100* 107* 98 92 95  BUN  --  87* 60* 42* 48* 55*  CREATININE  --  6.70* 5.34* 4.54* 5.24* 5.74*  CALCIUM  --  8.9 9.0 9.2 9.0 9.1  MG 2.0  --  1.9 1.9  --   --   PHOS  --  8.4* 6.6* 5.6*  --  6.6*   Liver Function Tests:  Recent Labs Lab 04/20/17 1502 04/21/17 0628 04/25/17 0914  AST 13*  --   --   ALT 7*  --   --   ALKPHOS 62  --   --   BILITOT 1.1  --   --   PROT 6.7  --   --   ALBUMIN 3.7 3.2* 3.1*   No  results for input(s): LIPASE, AMYLASE in the last 168 hours.  Recent Labs Lab 04/20/17 1458  AMMONIA 23   CBC:  Recent Labs Lab 04/20/17 1502 04/21/17 0531 04/22/17 0524 04/23/17 0523 04/24/17 0545 04/25/17 0915  WBC 4.3 3.9* 3.3* 3.1* 3.4* 3.1*  NEUTROABS 3.0  --   --   --   --   --   HGB 9.6* 8.2* 8.8* 8.4* 8.1* 8.3*  HCT 29.3* 26.0* 27.3* 26.9* 25.0* 26.0*  MCV 93.0 93.5 95.1 94.4 94.0 93.9  PLT 138* 129* 132* 140* 126* 131*   Cardiac Enzymes:  Recent Labs Lab 04/20/17 1502 04/20/17 2318  TROPONINI 0.05* 0.05*   BNP: Invalid input(s): POCBNP CBG:  Recent Labs Lab 04/24/17 1214 04/24/17 1655 04/24/17 2025 04/25/17 0747 04/25/17 1333  GLUCAP 115* 110* 110* 106* 105*   D-Dimer No results for input(s): DDIMER in the last 72 hours. Hgb A1c No results for input(s): HGBA1C in the last 72 hours. Lipid Profile No results for input(s): CHOL, HDL, LDLCALC, TRIG, CHOLHDL, LDLDIRECT in the last 72 hours. Thyroid function studies No results  for input(s): TSH, T4TOTAL, T3FREE, THYROIDAB in the last 72 hours.  Invalid input(s): FREET3 Anemia work up No results for input(s): VITAMINB12, FOLATE, FERRITIN, TIBC, IRON, RETICCTPCT in the last 72 hours. Urinalysis    Component Value Date/Time   COLORURINE AMBER (A) 01/25/2017 2240   APPEARANCEUR CLOUDY (A) 01/25/2017 2240   LABSPEC 1.014 01/25/2017 2240   PHURINE 6.0 01/25/2017 2240   GLUCOSEU NEGATIVE 01/25/2017 2240   HGBUR MODERATE (A) 01/25/2017 2240   BILIRUBINUR NEGATIVE 01/25/2017 2240   KETONESUR 5 (A) 01/25/2017 2240   PROTEINUR 100 (A) 01/25/2017 2240   NITRITE NEGATIVE 01/25/2017 2240   LEUKOCYTESUR SMALL (A) 01/25/2017 2240   Sepsis Labs Invalid input(s): PROCALCITONIN,  WBC,  LACTICIDVEN Microbiology Recent Results (from the past 240 hour(s))  MRSA PCR Screening     Status: None   Collection Time: 04/20/17  8:07 PM  Result Value Ref Range Status   MRSA by PCR NEGATIVE NEGATIVE Final    Comment:        The GeneXpert MRSA Assay (FDA approved for NASAL specimens only), is one component of a comprehensive MRSA colonization surveillance program. It is not intended to diagnose MRSA infection nor to guide or monitor treatment for MRSA infections.    Time coordinating discharge: 37 mins  SIGNED:  Standley Dakins, MD  [facility] 04/25/2017, 2:06 PM Pager [phone]  If 7PM-7AM, please contact night-coverage [email or web address] Password [email or web address]

## 2017-04-25 NOTE — Progress Notes (Signed)
[facility] at [facility] bedside. Pt in nad and will have her dialysis this am. Had a good night's rest.

## 2017-04-28 ENCOUNTER — Ambulatory Visit (INDEPENDENT_AMBULATORY_CARE_PROVIDER_SITE_OTHER): Payer: Medicare Other | Admitting: Orthopaedic Surgery

## 2017-04-28 ENCOUNTER — Ambulatory Visit (INDEPENDENT_AMBULATORY_CARE_PROVIDER_SITE_OTHER): Payer: No Typology Code available for payment source

## 2017-04-28 ENCOUNTER — Encounter (INDEPENDENT_AMBULATORY_CARE_PROVIDER_SITE_OTHER): Payer: Self-pay | Admitting: Orthopaedic Surgery

## 2017-04-28 DIAGNOSIS — S72145A Nondisplaced intertrochanteric fracture of left femur, initial encounter for closed fracture: Secondary | ICD-10-CM | POA: Diagnosis not present

## 2017-04-28 NOTE — Progress Notes (Signed)
Office Visit Note   Patient: Lindsey Sutton           Date of Birth: 08/11/1946           MRN: [id number] Visit Date: 04/28/2017              Requested by: Pola CornBrown, Rita Harbison, NP [address] [place], [place] [id number] PCP: Pola CornBrown, Rita Harbison, NP   Assessment & Plan: Visit Diagnoses:  1. Nondisplaced intertrochanteric fracture of left femur, initial encounter for closed fracture [facility](HCC)     Plan: We discussed the importance of mobilizing and working with physical therapy and finding the motivation to get better. From my standpoint she is stable. Follow-up as needed.  Follow-Up Instructions: Return if symptoms worsen or fail to improve.   Orders:  Orders Placed This Encounter  Procedures  . XR HIP UNILAT W OR W/O PELVIS 2-3 VIEWS LEFT   No orders of the defined types were placed in this encounter.     Procedures: No procedures performed   Clinical Data: No additional findings.   Subjective: Chief Complaint  Patient presents with  . Left Hip - Pain    Patient comes in today 4 months status post intramedullary nailing of a left intertrochanteric fracture. She was originally doing well but recently has gotten depressed and lost motivation to get stronger. She has fallen recently. X-rays after her most recent fall were negative for any acute complications regarding the implant and her left femur. She is currently at a nursing home.    Review of Systems   Objective: Vital Signs: There were no vitals taken for this visit.  Physical Exam  Ortho Exam Left lower extremity exam shows well-healed surgical scars. No crepitus or pain with movement. Specialty Comments:  No specialty comments available.  Imaging: Xr Hip Unilat W Or W/o Pelvis 2-3 Views Left  Result Date: 04/28/2017 Stable fixation without complications.    PMFS History: Patient Active Problem List   Diagnosis Date Noted  . Palliative care by specialist   . Acute metabolic encephalopathy 04/20/2017    . Gait instability 04/20/2017  . Frequent falls 04/20/2017  . Anemia in chronic kidney disease (CKD) 04/20/2017  . Pancytopenia (HCC) 01/23/2017  . Secondary hyperparathyroidism of renal origin (HCC) 01/22/2017  . Hyperphosphatemia 01/22/2017  . Aortic stenosis, moderate-severe 01/22/2017  . Essential hypertension 01/21/2017  . Hyponatremia 01/21/2017  . NAFLD (nonalcoholic fatty liver disease) 19/14/782905/01/2017  . Thrombocytopenia (HCC) 01/21/2017  . Obesity (BMI 30-39.9) 01/20/2017  . Hip fx (HCC) 01/19/2017  . A-fib (HCC) 01/19/2017  . ESRD (end stage renal disease) (HCC) 01/19/2017  . Pacemaker   . Iron deficiency anemia   . Systolic heart failure (HCC)   . Ascites   . DM (diabetes mellitus), type 2 with renal complications (HCC)   . Nondisplaced intertrochanteric fracture of left femur, initial encounter for closed fracture [facility](HCC)    Past Medical History:  Diagnosis Date  . A-fib (HCC)   . Anxiety   . Arthritis    "legs" (04/20/2017)  . COPD (chronic obstructive pulmonary disease) (HCC)   . Depression   . ESRD (end stage renal disease) on dialysis (HCC)    "TTS; [facility]; [place]" (04/20/2017)  . Heart murmur   . History of blood transfusion    related to childbirth  . Iron deficiency anemia   . NAFLD (nonalcoholic fatty liver disease) 5/6/21305/01/2017  . Obesity (BMI 30-39.9) 01/20/2017  . Pacemaker   . Pulmonary edema   . Secondary hyperparathyroidism  of renal origin (HCC) 01/22/2017  . Systolic heart failure (HCC)   . Thrombocytopenia (HCC) 01/21/2017  . Type II diabetes mellitus (HCC)     Family History  Problem Relation Age of Onset  . Diabetes Mother   . Hypertension Father     Past Surgical History:  Procedure Laterality Date  . AV FISTULA PLACEMENT Left ~ 2003  . AV FISTULA REPAIR Left X 2  . CARDIAC CATHETERIZATION    . CATARACT EXTRACTION W/ INTRAOCULAR LENS  IMPLANT, BILATERAL Bilateral 2017  . FRACTURE SURGERY    . INSERT / REPLACE / REMOVE PACEMAKER  ~ 2012  .  INTRAMEDULLARY (IM) NAIL INTERTROCHANTERIC Left 01/20/2017   Procedure: INTRAMEDULLARY (IM) NAIL INTERTROCHANTRIC;  Surgeon: Tarry Kos, MD;  Location: [facility] OR;  Service: Orthopedics;  Laterality: Left;   Social History   Occupational History  . Not on file.   Social History Main Topics  . Smoking status: Former Smoker    Packs/day: 1.00    Years: 33.00    Types: Cigarettes    Quit date: 09/23/1996  . Smokeless tobacco: Never Used  . Alcohol use No     Comment: 04/20/2017 "nothing in the 2000s"  . Drug use: No  . Sexual activity: No

## 2017-10-05 ENCOUNTER — Emergency Department (HOSPITAL_COMMUNITY): Payer: Medicare Other

## 2017-10-05 ENCOUNTER — Inpatient Hospital Stay (HOSPITAL_COMMUNITY)
Admission: EM | Admit: 2017-10-05 | Discharge: 2017-10-10 | DRG: 441 | Disposition: A | Discharge status: Home / Self Care | Payer: Medicare Other | Attending: Internal Medicine | Admitting: Internal Medicine

## 2017-10-05 ENCOUNTER — Encounter (HOSPITAL_COMMUNITY): Payer: Self-pay | Admitting: Emergency Medicine

## 2017-10-05 ENCOUNTER — Other Ambulatory Visit: Payer: Self-pay

## 2017-10-05 DIAGNOSIS — E669 Obesity, unspecified: Secondary | ICD-10-CM | POA: Diagnosis present

## 2017-10-05 DIAGNOSIS — N186 End stage renal disease: Secondary | ICD-10-CM | POA: Diagnosis present

## 2017-10-05 DIAGNOSIS — R112 Nausea with vomiting, unspecified: Secondary | ICD-10-CM

## 2017-10-05 DIAGNOSIS — Z66 Do not resuscitate: Secondary | ICD-10-CM | POA: Diagnosis present

## 2017-10-05 DIAGNOSIS — I10 Essential (primary) hypertension: Secondary | ICD-10-CM | POA: Diagnosis present

## 2017-10-05 DIAGNOSIS — I272 Pulmonary hypertension, unspecified: Secondary | ICD-10-CM | POA: Diagnosis present

## 2017-10-05 DIAGNOSIS — R0601 Orthopnea: Secondary | ICD-10-CM

## 2017-10-05 DIAGNOSIS — D631 Anemia in chronic kidney disease: Secondary | ICD-10-CM | POA: Diagnosis present

## 2017-10-05 DIAGNOSIS — J449 Chronic obstructive pulmonary disease, unspecified: Secondary | ICD-10-CM | POA: Diagnosis present

## 2017-10-05 DIAGNOSIS — R188 Other ascites: Secondary | ICD-10-CM | POA: Diagnosis present

## 2017-10-05 DIAGNOSIS — F329 Major depressive disorder, single episode, unspecified: Secondary | ICD-10-CM | POA: Diagnosis present

## 2017-10-05 DIAGNOSIS — E8779 Other fluid overload: Secondary | ICD-10-CM | POA: Diagnosis present

## 2017-10-05 DIAGNOSIS — Z8249 Family history of ischemic heart disease and other diseases of the circulatory system: Secondary | ICD-10-CM

## 2017-10-05 DIAGNOSIS — E8889 Other specified metabolic disorders: Secondary | ICD-10-CM | POA: Diagnosis present

## 2017-10-05 DIAGNOSIS — N2581 Secondary hyperparathyroidism of renal origin: Secondary | ICD-10-CM | POA: Diagnosis present

## 2017-10-05 DIAGNOSIS — Z7982 Long term (current) use of aspirin: Secondary | ICD-10-CM

## 2017-10-05 DIAGNOSIS — Z950 Presence of cardiac pacemaker: Secondary | ICD-10-CM

## 2017-10-05 DIAGNOSIS — Z87891 Personal history of nicotine dependence: Secondary | ICD-10-CM

## 2017-10-05 DIAGNOSIS — Z7951 Long term (current) use of inhaled steroids: Secondary | ICD-10-CM

## 2017-10-05 DIAGNOSIS — E279 Disorder of adrenal gland, unspecified: Secondary | ICD-10-CM | POA: Diagnosis present

## 2017-10-05 DIAGNOSIS — E1129 Type 2 diabetes mellitus with other diabetic kidney complication: Secondary | ICD-10-CM | POA: Diagnosis present

## 2017-10-05 DIAGNOSIS — Z992 Dependence on renal dialysis: Secondary | ICD-10-CM

## 2017-10-05 DIAGNOSIS — E1122 Type 2 diabetes mellitus with diabetic chronic kidney disease: Secondary | ICD-10-CM | POA: Diagnosis present

## 2017-10-05 DIAGNOSIS — I77810 Thoracic aortic ectasia: Secondary | ICD-10-CM | POA: Diagnosis present

## 2017-10-05 DIAGNOSIS — Z833 Family history of diabetes mellitus: Secondary | ICD-10-CM

## 2017-10-05 DIAGNOSIS — K7460 Unspecified cirrhosis of liver: Secondary | ICD-10-CM | POA: Diagnosis present

## 2017-10-05 DIAGNOSIS — G8929 Other chronic pain: Secondary | ICD-10-CM | POA: Diagnosis present

## 2017-10-05 DIAGNOSIS — I481 Persistent atrial fibrillation: Secondary | ICD-10-CM | POA: Diagnosis present

## 2017-10-05 DIAGNOSIS — D509 Iron deficiency anemia, unspecified: Secondary | ICD-10-CM | POA: Diagnosis present

## 2017-10-05 DIAGNOSIS — R0603 Acute respiratory distress: Secondary | ICD-10-CM

## 2017-10-05 DIAGNOSIS — R64 Cachexia: Secondary | ICD-10-CM | POA: Diagnosis present

## 2017-10-05 DIAGNOSIS — R0602 Shortness of breath: Secondary | ICD-10-CM

## 2017-10-05 DIAGNOSIS — I350 Nonrheumatic aortic (valve) stenosis: Secondary | ICD-10-CM | POA: Diagnosis present

## 2017-10-05 DIAGNOSIS — Z7984 Long term (current) use of oral hypoglycemic drugs: Secondary | ICD-10-CM

## 2017-10-05 DIAGNOSIS — I132 Hypertensive heart and chronic kidney disease with heart failure and with stage 5 chronic kidney disease, or end stage renal disease: Secondary | ICD-10-CM | POA: Diagnosis present

## 2017-10-05 DIAGNOSIS — Z6827 Body mass index (BMI) 27.0-27.9, adult: Secondary | ICD-10-CM

## 2017-10-05 DIAGNOSIS — I5022 Chronic systolic (congestive) heart failure: Secondary | ICD-10-CM | POA: Diagnosis present

## 2017-10-05 DIAGNOSIS — Z880 Allergy status to penicillin: Secondary | ICD-10-CM

## 2017-10-05 DIAGNOSIS — M1990 Unspecified osteoarthritis, unspecified site: Secondary | ICD-10-CM | POA: Diagnosis present

## 2017-10-05 DIAGNOSIS — Z9115 Patient's noncompliance with renal dialysis: Secondary | ICD-10-CM

## 2017-10-05 DIAGNOSIS — K760 Fatty (change of) liver, not elsewhere classified: Secondary | ICD-10-CM | POA: Diagnosis present

## 2017-10-05 DIAGNOSIS — I4891 Unspecified atrial fibrillation: Secondary | ICD-10-CM | POA: Diagnosis present

## 2017-10-05 DIAGNOSIS — F419 Anxiety disorder, unspecified: Secondary | ICD-10-CM | POA: Diagnosis present

## 2017-10-05 DIAGNOSIS — D696 Thrombocytopenia, unspecified: Secondary | ICD-10-CM | POA: Diagnosis present

## 2017-10-05 DIAGNOSIS — Z9119 Patient's noncompliance with other medical treatment and regimen: Secondary | ICD-10-CM

## 2017-10-05 DIAGNOSIS — K7581 Nonalcoholic steatohepatitis (NASH): Principal | ICD-10-CM | POA: Diagnosis present

## 2017-10-05 LAB — LIPASE, BLOOD: Lipase: 27 U/L (ref 11–51)

## 2017-10-05 LAB — COMPREHENSIVE METABOLIC PANEL
ALK PHOS: 66 U/L (ref 38–126)
ALT: 6 U/L — AB (ref 14–54)
AST: 13 U/L — AB (ref 15–41)
Albumin: 3 g/dL — ABNORMAL LOW (ref 3.5–5.0)
Anion gap: 19 — ABNORMAL HIGH (ref 5–15)
BUN: 36 mg/dL — AB (ref 6–20)
CALCIUM: 8.4 mg/dL — AB (ref 8.9–10.3)
CO2: 23 mmol/L (ref 22–32)
CREATININE: 7.54 mg/dL — AB (ref 0.44–1.00)
Chloride: 92 mmol/L — ABNORMAL LOW (ref 101–111)
GFR calc non Af Amer: 5 mL/min — ABNORMAL LOW (ref >60)
GFR, EST AFRICAN AMERICAN: 6 mL/min — AB (ref >60)
Glucose, Bld: 136 mg/dL — ABNORMAL HIGH (ref 65–99)
Potassium: 3.7 mmol/L (ref 3.5–5.1)
Sodium: 134 mmol/L — ABNORMAL LOW (ref 135–145)
Total Bilirubin: 1.6 mg/dL — ABNORMAL HIGH (ref 0.3–1.2)
Total Protein: 6.5 g/dL (ref 6.5–8.1)

## 2017-10-05 LAB — CBC
HCT: 36.4 % (ref 36.0–46.0)
Hemoglobin: 11.3 g/dL — ABNORMAL LOW (ref 12.0–15.0)
MCH: 28.4 pg (ref 26.0–34.0)
MCHC: 31 g/dL (ref 30.0–36.0)
MCV: 91.5 fL (ref 78.0–100.0)
PLATELETS: 109 10*3/uL — AB (ref 150–400)
RBC: 3.98 MIL/uL (ref 3.87–5.11)
RDW: 16.4 % — AB (ref 11.5–15.5)
WBC: 4.2 10*3/uL (ref 4.0–10.5)

## 2017-10-05 LAB — I-STAT CG4 LACTIC ACID, ED: LACTIC ACID, VENOUS: 1.67 mmol/L (ref 0.5–1.9)

## 2017-10-05 MED ORDER — ONDANSETRON HCL 4 MG/2ML IJ SOLN
4.0000 mg | Freq: Once | INTRAMUSCULAR | Status: AC | PRN
  Administered 2017-10-05: 4 mg via INTRAVENOUS

## 2017-10-05 MED ORDER — LORAZEPAM 2 MG/ML IJ SOLN
0.5000 mg | Freq: Once | INTRAMUSCULAR | Status: AC
  Administered 2017-10-05: 0.5 mg via INTRAVENOUS
  Filled 2017-10-05: qty 1

## 2017-10-05 MED ORDER — PROMETHAZINE HCL 25 MG/ML IJ SOLN
12.5000 mg | Freq: Once | INTRAMUSCULAR | Status: AC
  Administered 2017-10-05: 12.5 mg via INTRAVENOUS
  Filled 2017-10-05: qty 1

## 2017-10-05 NOTE — ED Notes (Signed)
Output from Paracentesis=[id number]

## 2017-10-05 NOTE — ED Triage Notes (Signed)
Patient arrived via [facility]. Patient has not been going to dialysis for about a month. Per family, patient sometimes will not complete dialysis. Patient has a pacemaker, left- fistula(PT did dialysis today)

## 2017-10-05 NOTE — ED Notes (Signed)
EDP at bedside. Patient sitting on side of bed for comfort.

## 2017-10-05 NOTE — ED Notes (Signed)
MD explained procedure to patient, who verbalized undestanding and denies any questions. Informed consent form signed and at bedside. Paracentesis tray at bedside.

## 2017-10-05 NOTE — ED Provider Notes (Signed)
[facility] EMERGENCY DEPARTMENT Provider Note   CSN: [id number] Arrival date & time: 10/05/17  1932     History   Chief Complaint Chief Complaint  Patient presents with  . Nausea  . Emesis    HPI Lindsey Sutton is a 72 y.o. female.  Patient has been non compliant with dialysis. Last full session was about 3 weeks ago.  This week she went twice (including once today) and stayed for less than one hour each session due to "feeling ill" with nausea and vomiting.  She has dyspnea when laying flat.   The history is provided by the patient.  Emesis   This is a new problem. The current episode started more than 1 week ago. Episode frequency: intermittent. The emesis has an appearance of stomach contents. There has been no fever. Associated symptoms include abdominal pain. Pertinent negatives include no arthralgias, no chills, no cough and no fever.    Past Medical History:  Diagnosis Date  . A-fib (HCC)   . Anxiety   . Arthritis    "legs" (04/20/2017)  . COPD (chronic obstructive pulmonary disease) (HCC)   . Depression   . ESRD (end stage renal disease) on dialysis (HCC)    "TTS; [facility]; [place]" (04/20/2017)  . Heart murmur   . History of blood transfusion    related to childbirth  . Iron deficiency anemia   . NAFLD (nonalcoholic fatty liver disease) 4/0/9811  . Obesity (BMI 30-39.9) 01/20/2017  . Pacemaker   . Pulmonary edema   . Secondary hyperparathyroidism of renal origin (HCC) 01/22/2017  . Systolic heart failure (HCC)   . Thrombocytopenia (HCC) 01/21/2017  . Type II diabetes mellitus [facility])     Patient Active Problem List   Diagnosis Date Noted  . Palliative care by specialist   . Acute metabolic encephalopathy 04/20/2017  . Gait instability 04/20/2017  . Frequent falls 04/20/2017  . Anemia in chronic kidney disease (CKD) 04/20/2017  . Pancytopenia (HCC) 01/23/2017  . Secondary hyperparathyroidism of renal origin (HCC) 01/22/2017  .  Hyperphosphatemia 01/22/2017  . Aortic stenosis, moderate-severe 01/22/2017  . Essential hypertension 01/21/2017  . Hyponatremia 01/21/2017  . NAFLD (nonalcoholic fatty liver disease) 91/47/8295  . Thrombocytopenia (HCC) 01/21/2017  . Obesity (BMI 30-39.9) 01/20/2017  . Hip fx (HCC) 01/19/2017  . A-fib (HCC) 01/19/2017  . ESRD (end stage renal disease) (HCC) 01/19/2017  . Pacemaker   . Iron deficiency anemia   . Systolic heart failure (HCC)   . Ascites   . DM (diabetes mellitus), type 2 with renal complications (HCC)   . Nondisplaced intertrochanteric fracture of left femur, initial encounter for closed fracture [facility])     Past Surgical History:  Procedure Laterality Date  . AV FISTULA PLACEMENT Left ~ 2003  . AV FISTULA REPAIR Left X 2  . CARDIAC CATHETERIZATION    . CATARACT EXTRACTION W/ INTRAOCULAR LENS  IMPLANT, BILATERAL Bilateral 2017  . FRACTURE SURGERY    . INSERT / REPLACE / REMOVE PACEMAKER  ~ 2012  . INTRAMEDULLARY (IM) NAIL INTERTROCHANTERIC Left 01/20/2017   Procedure: INTRAMEDULLARY (IM) NAIL INTERTROCHANTRIC;  Surgeon: Tarry Kos, MD;  Location: [facility] OR;  Service: Orthopedics;  Laterality: Left;    OB History    No data available       Home Medications    Prior to Admission medications   Medication Sig Start Date End Date Taking? Authorizing Provider  acetaminophen (TYLENOL) 650 MG CR tablet Take 650 mg by mouth every 8 (eight)  hours as needed for pain.    [provider]  albuterol (PROVENTIL) (2.5 MG/3ML) 0.083% nebulizer solution Take 3 mLs (2.5 mg total) by nebulization every 4 (four) hours as needed for wheezing or shortness of breath. 04/25/17   Johnson, Clanford L, MD  aspirin EC 81 MG tablet Take 81 mg by mouth daily.    [provider]  budesonide-formoterol (SYMBICORT) 160-4.5 MCG/ACT inhaler Inhale 2 puffs into the lungs 2 (two) times daily.    [provider]  cinacalcet (SENSIPAR) 30 MG tablet Take 30 mg by mouth daily  with supper.    [provider]  escitalopram (LEXAPRO) 10 MG tablet Take 1 tablet (10 mg total) by mouth daily. 04/25/17   Johnson, Clanford L, MD  folic acid (FOLVITE) 1 MG tablet Take 1 tablet (1 mg total) by mouth daily. 04/25/17   Johnson, Clanford L, MD  linagliptin (TRADJENTA) 5 MG TABS tablet Take 1 tablet (5 mg total) by mouth daily. 04/25/17   Cleora FleetJohnson, Clanford L, MD  Nutritional Supplements (FEEDING SUPPLEMENT, NEPRO CARB STEADY,) LIQD Take 237 mLs by mouth daily at 3 pm. 04/25/17   Laural BenesJohnson, Clanford L, MD  oxyCODONE (OXY IR/ROXICODONE) 5 MG immediate release tablet Take 1 tablet (5 mg total) by mouth every 4 (four) hours as needed for severe pain. 04/25/17   Johnson, Clanford L, MD  polyethylene glycol (MIRALAX / GLYCOLAX) packet Take 17 g by mouth daily. 04/25/17   Johnson, Clanford L, MD  pregabalin (LYRICA) 75 MG capsule Take 75 mg by mouth 2 (two) times daily.    [provider]  senna-docusate (SENOKOT-S) 8.6-50 MG tablet Take 1 tablet by mouth 2 (two) times daily. 01/26/17   Rai, Ripudeep Kirtland BouchardK, MD  sevelamer carbonate (RENVELA) 800 MG tablet Take 800 mg by mouth 3 (three) times daily with meals.    [provider]  traZODone (DESYREL) 50 MG tablet Take 50 mg by mouth at bedtime.    [provider]    Family History Family History  Problem Relation Age of Onset  . Diabetes Mother   . Hypertension Father     Social History Social History   Tobacco Use  . Smoking status: Former Smoker    Packs/day: 1.00    Years: 33.00    Pack years: 33.00    Types: Cigarettes    Last attempt to quit: 09/23/1996    Years since quitting: 21.0  . Smokeless tobacco: Never Used  Substance Use Topics  . Alcohol use: No    Comment: 04/20/2017 "nothing in the 2000s"  . Drug use: No     Allergies   Penicillins   Review of Systems Review of Systems  Constitutional: Negative for chills and fever.  HENT: Negative for ear pain and sore throat.   Eyes: Negative for  pain and visual disturbance.  Respiratory: Positive for shortness of breath (when flat). Negative for cough.   Cardiovascular: Negative for chest pain and palpitations.  Gastrointestinal: Positive for abdominal distention, abdominal pain and vomiting.  Genitourinary: Negative for dysuria and hematuria.  Musculoskeletal: Negative for arthralgias and back pain.  Skin: Negative for color change and rash.  Neurological: Negative for seizures and syncope.  All other systems reviewed and are negative.    Physical Exam Updated Vital Signs BP (!) 169/82   Pulse 81   Temp 97.8 F (36.6 C) (Oral)   Resp (!) 23   Ht 5\' 7"  (1.702 m)   SpO2 95%   BMI 32.25 kg/m  Physical Exam  Constitutional: She is oriented to person, place, and time. She appears well-developed and well-nourished. No distress.  HENT:  Head: Normocephalic and atraumatic.  Eyes: Conjunctivae and EOM are normal. Pupils are equal, round, and reactive to light.  Neck: Normal range of motion. Neck supple.  Cardiovascular: Normal rate.  Murmur heard. Irregular rhythm  Pulmonary/Chest: Effort normal and breath sounds normal. No respiratory distress.  Abdominal: She exhibits distension.  Musculoskeletal: She exhibits no edema.  Neurological: She is alert and oriented to person, place, and time.  Skin: Skin is warm and dry.  Psychiatric: She has a normal mood and affect.  Nursing note and vitals reviewed.    ED Treatments / Results  Labs (all labs ordered are listed, but only abnormal results are displayed) Labs Reviewed  LIPASE, BLOOD  COMPREHENSIVE METABOLIC PANEL  CBC  URINALYSIS, ROUTINE W REFLEX MICROSCOPIC  I-STAT CG4 LACTIC ACID, ED    EKG  EKG Interpretation  Date/Time:  Thursday October 05 2017 19:38:55 EST Ventricular Rate:  80 PR Interval:    QRS Duration: 146 QT Interval:  449 QTC Calculation: 518 R Axis:   -71 Text Interpretation:  Atrial fibrillation Nonspecific IVCD with LAD since last  tracing no significant change Confirmed by Eber Hong ([id number]) on 10/05/2017 8:13:41 PM       Radiology No results found.  Procedures .Paracentesis Date/Time: 10/05/2017 8:37 PM Performed by: Garey Ham, MD Authorized by: Maia Plan, MD   Consent:    Consent obtained:  Written   Consent given by:  Patient   Risks discussed:  Bleeding, bowel perforation, infection and pain   Alternatives discussed:  No treatment Pre-procedure details:    Procedure purpose:  Diagnostic   Preparation: Patient was prepped and draped in usual sterile fashion   Anesthesia (see MAR for exact dosages):    Anesthesia method:  Local infiltration   Local anesthetic:  Lidocaine 1% w/o epi Procedure details:    Ultrasound guidance: yes     Puncture site:  R lower quadrant   Fluid removed amount:  700   Fluid appearance:  Yellow and clear   Dressing:  4x4 sterile gauze and adhesive bandage Post-procedure details:    Patient tolerance of procedure:  Tolerated well, no immediate complications    (including critical care time)  Medications Ordered in ED Medications  ondansetron (ZOFRAN) injection 4 mg (4 mg Intravenous Given 10/05/17 1951)  promethazine (PHENERGAN) injection 12.5 mg (12.5 mg Intravenous Given 10/05/17 2020)     Initial Impression / Assessment and Plan / ED Course  I have reviewed the triage vital signs and the nursing notes.  Pertinent labs & imaging results that were available during my care of the patient were reviewed by me and considered in my medical decision making (see chart for details).     Ms. Mcconnell is a 72 year old female with a past medical history significant for A. fib, COPD, end-stage renal disease, nonalcoholic fatty liver disease, pacemaker, type 2 diabetes, heart failure who presents for nausea, vomiting and dyspnea.  Patient given zofran and phenergan for nausea control.  Labs significant for elevated creatinine. Electrolyte disturbances consistent  with baseline.  No leukocytosis. Hemoglobin elevated with low platelets.  Paracentesis performed as described above.  Labs not consistent with SBP.  Abdominal and chest XRs obtained, personally reviewed by me, demonstrate cardiomegaly with pulmonary edema; no dilated bowel loops.  Patient admitted for intractable emesis and anticipated large volume paracentesis.    Final Clinical Impressions(s) /  ED Diagnoses   Final diagnoses:  Other ascites  Orthopnea  Intractable vomiting with nausea, unspecified vomiting type    ED Discharge Orders    None       Garey Ham, MD 10/06/17 0981    Maia Plan, MD 10/06/17 1610

## 2017-10-05 NOTE — ED Notes (Signed)
Patient transported to X-ray 

## 2017-10-05 NOTE — ED Notes (Signed)
Patient has been non-compliant with her dialysis over the past month. Family states, "she says she wants to keep going to dialysis and she does, but her actions do not say that's what she wants and when she goes, it's once a week and they only do it for about an hour when it should be 4-4.5 hours." Patient was at bedside when family reported this and nodded when asked if that was correct. Patient does endorse wanting to be a DNR if she stays here.

## 2017-10-05 NOTE — ED Notes (Signed)
Family adds that the reason dialysis is stopped after an hour is because the patient feels sick. This has been going on for a month. Her abdomen is very distended - family states that a little fluid was pulled off from her abdomen, but her belly has only gotten bigger and bigger. Patient had dialysis Tuesday x 1 hour and today x 1 hour. She reports she hasn't been going much because she "doesn't feel like it."

## 2017-10-06 ENCOUNTER — Encounter (HOSPITAL_COMMUNITY): Payer: Self-pay | Admitting: Family Medicine

## 2017-10-06 ENCOUNTER — Observation Stay (HOSPITAL_COMMUNITY): Payer: Medicare Other

## 2017-10-06 DIAGNOSIS — D696 Thrombocytopenia, unspecified: Secondary | ICD-10-CM | POA: Diagnosis present

## 2017-10-06 DIAGNOSIS — I77810 Thoracic aortic ectasia: Secondary | ICD-10-CM | POA: Diagnosis present

## 2017-10-06 DIAGNOSIS — I481 Persistent atrial fibrillation: Secondary | ICD-10-CM | POA: Diagnosis present

## 2017-10-06 DIAGNOSIS — I10 Essential (primary) hypertension: Secondary | ICD-10-CM

## 2017-10-06 DIAGNOSIS — R188 Other ascites: Secondary | ICD-10-CM | POA: Diagnosis present

## 2017-10-06 DIAGNOSIS — I480 Paroxysmal atrial fibrillation: Secondary | ICD-10-CM

## 2017-10-06 DIAGNOSIS — Z9115 Patient's noncompliance with renal dialysis: Secondary | ICD-10-CM | POA: Diagnosis not present

## 2017-10-06 DIAGNOSIS — E279 Disorder of adrenal gland, unspecified: Secondary | ICD-10-CM | POA: Diagnosis present

## 2017-10-06 DIAGNOSIS — K7581 Nonalcoholic steatohepatitis (NASH): Secondary | ICD-10-CM | POA: Diagnosis present

## 2017-10-06 DIAGNOSIS — I132 Hypertensive heart and chronic kidney disease with heart failure and with stage 5 chronic kidney disease, or end stage renal disease: Secondary | ICD-10-CM | POA: Diagnosis present

## 2017-10-06 DIAGNOSIS — R64 Cachexia: Secondary | ICD-10-CM | POA: Diagnosis present

## 2017-10-06 DIAGNOSIS — D631 Anemia in chronic kidney disease: Secondary | ICD-10-CM | POA: Diagnosis present

## 2017-10-06 DIAGNOSIS — J449 Chronic obstructive pulmonary disease, unspecified: Secondary | ICD-10-CM | POA: Diagnosis present

## 2017-10-06 DIAGNOSIS — I272 Pulmonary hypertension, unspecified: Secondary | ICD-10-CM | POA: Diagnosis present

## 2017-10-06 DIAGNOSIS — E8779 Other fluid overload: Secondary | ICD-10-CM | POA: Diagnosis present

## 2017-10-06 DIAGNOSIS — N186 End stage renal disease: Secondary | ICD-10-CM

## 2017-10-06 DIAGNOSIS — R0603 Acute respiratory distress: Secondary | ICD-10-CM

## 2017-10-06 DIAGNOSIS — I361 Nonrheumatic tricuspid (valve) insufficiency: Secondary | ICD-10-CM | POA: Diagnosis not present

## 2017-10-06 DIAGNOSIS — K7460 Unspecified cirrhosis of liver: Secondary | ICD-10-CM | POA: Diagnosis present

## 2017-10-06 DIAGNOSIS — E1122 Type 2 diabetes mellitus with diabetic chronic kidney disease: Secondary | ICD-10-CM | POA: Diagnosis present

## 2017-10-06 DIAGNOSIS — N2581 Secondary hyperparathyroidism of renal origin: Secondary | ICD-10-CM | POA: Diagnosis present

## 2017-10-06 DIAGNOSIS — R0601 Orthopnea: Secondary | ICD-10-CM | POA: Diagnosis present

## 2017-10-06 DIAGNOSIS — M1990 Unspecified osteoarthritis, unspecified site: Secondary | ICD-10-CM | POA: Diagnosis present

## 2017-10-06 DIAGNOSIS — I350 Nonrheumatic aortic (valve) stenosis: Secondary | ICD-10-CM | POA: Diagnosis present

## 2017-10-06 DIAGNOSIS — Z992 Dependence on renal dialysis: Secondary | ICD-10-CM

## 2017-10-06 DIAGNOSIS — E8889 Other specified metabolic disorders: Secondary | ICD-10-CM | POA: Diagnosis present

## 2017-10-06 DIAGNOSIS — I5022 Chronic systolic (congestive) heart failure: Secondary | ICD-10-CM | POA: Diagnosis present

## 2017-10-06 DIAGNOSIS — D509 Iron deficiency anemia, unspecified: Secondary | ICD-10-CM | POA: Diagnosis present

## 2017-10-06 DIAGNOSIS — K760 Fatty (change of) liver, not elsewhere classified: Secondary | ICD-10-CM | POA: Diagnosis not present

## 2017-10-06 DIAGNOSIS — Z66 Do not resuscitate: Secondary | ICD-10-CM | POA: Diagnosis present

## 2017-10-06 DIAGNOSIS — G8929 Other chronic pain: Secondary | ICD-10-CM | POA: Diagnosis present

## 2017-10-06 HISTORY — PX: IR PARACENTESIS: IMG2679

## 2017-10-06 LAB — BODY FLUID CELL COUNT WITH DIFFERENTIAL
EOS FL: 0 %
Lymphs, Fluid: 22 %
Monocyte-Macrophage-Serous Fluid: 78 % (ref 50–90)
NEUTROPHIL FLUID: 0 % (ref 0–25)
WBC FLUID: 161 uL (ref 0–1000)

## 2017-10-06 LAB — CBC
HCT: 35 % — ABNORMAL LOW (ref 36.0–46.0)
HEMOGLOBIN: 10.7 g/dL — AB (ref 12.0–15.0)
MCH: 28.1 pg (ref 26.0–34.0)
MCHC: 30.6 g/dL (ref 30.0–36.0)
MCV: 91.9 fL (ref 78.0–100.0)
Platelets: 114 10*3/uL — ABNORMAL LOW (ref 150–400)
RBC: 3.81 MIL/uL — AB (ref 3.87–5.11)
RDW: 16.3 % — ABNORMAL HIGH (ref 11.5–15.5)
WBC: 5.1 10*3/uL (ref 4.0–10.5)

## 2017-10-06 LAB — COMPREHENSIVE METABOLIC PANEL
ALT: 6 U/L — ABNORMAL LOW (ref 14–54)
ANION GAP: 19 — AB (ref 5–15)
AST: 13 U/L — ABNORMAL LOW (ref 15–41)
Albumin: 2.9 g/dL — ABNORMAL LOW (ref 3.5–5.0)
Alkaline Phosphatase: 54 U/L (ref 38–126)
BILIRUBIN TOTAL: 1.4 mg/dL — AB (ref 0.3–1.2)
BUN: 36 mg/dL — ABNORMAL HIGH (ref 6–20)
CALCIUM: 8.4 mg/dL — AB (ref 8.9–10.3)
CO2: 23 mmol/L (ref 22–32)
Chloride: 92 mmol/L — ABNORMAL LOW (ref 101–111)
Creatinine, Ser: 7.9 mg/dL — ABNORMAL HIGH (ref 0.44–1.00)
GFR calc non Af Amer: 5 mL/min — ABNORMAL LOW (ref >60)
GFR, EST AFRICAN AMERICAN: 5 mL/min — AB (ref >60)
Glucose, Bld: 104 mg/dL — ABNORMAL HIGH (ref 65–99)
Potassium: 3.6 mmol/L (ref 3.5–5.1)
SODIUM: 134 mmol/L — AB (ref 135–145)
TOTAL PROTEIN: 6.1 g/dL — AB (ref 6.5–8.1)

## 2017-10-06 LAB — CBG MONITORING, ED
GLUCOSE-CAPILLARY: 102 mg/dL — AB (ref 65–99)
Glucose-Capillary: 92 mg/dL (ref 65–99)

## 2017-10-06 LAB — GLUCOSE, CAPILLARY: GLUCOSE-CAPILLARY: 91 mg/dL (ref 65–99)

## 2017-10-06 LAB — GLUCOSE, PLEURAL OR PERITONEAL FLUID: Glucose, Fluid: 102 mg/dL

## 2017-10-06 MED ORDER — TRAZODONE HCL 50 MG PO TABS: 50.0000 mg | ORAL_TABLET | Freq: Every day | ORAL | Status: DC

## 2017-10-06 MED ORDER — POLYETHYLENE GLYCOL 3350 17 G PO PACK
17.0000 g | PACK | Freq: Every day | ORAL | Status: DC
  Filled 2017-10-06 (×3): qty 1

## 2017-10-06 MED ORDER — SODIUM CHLORIDE 0.9 % IV SOLN: 250.0000 mL | INTRAVENOUS | Status: DC | PRN

## 2017-10-06 MED ORDER — ASPIRIN EC 81 MG PO TBEC
81.0000 mg | DELAYED_RELEASE_TABLET | Freq: Every day | ORAL | Status: DC
  Administered 2017-10-06 – 2017-10-09 (×4): 81 mg via ORAL
  Filled 2017-10-06 (×4): qty 1

## 2017-10-06 MED ORDER — TRAMADOL HCL 50 MG PO TABS: 50.0000 mg | ORAL_TABLET | Freq: Two times a day (BID) | ORAL | Status: DC | PRN

## 2017-10-06 MED ORDER — ALBUMIN HUMAN 25 % IV SOLN
50.0000 g | Freq: Once | INTRAVENOUS | Status: DC | PRN
  Filled 2017-10-06: qty 200

## 2017-10-06 MED ORDER — CINACALCET HCL 30 MG PO TABS
30.0000 mg | ORAL_TABLET | Freq: Every day | ORAL | Status: DC
  Administered 2017-10-06 – 2017-10-09 (×4): 30 mg via ORAL
  Filled 2017-10-06 (×5): qty 1

## 2017-10-06 MED ORDER — SODIUM CHLORIDE 0.9% FLUSH
3.0000 mL | Freq: Two times a day (BID) | INTRAVENOUS | Status: DC
  Administered 2017-10-06 – 2017-10-08 (×5): 3 mL via INTRAVENOUS

## 2017-10-06 MED ORDER — ESCITALOPRAM OXALATE 10 MG PO TABS
10.0000 mg | ORAL_TABLET | Freq: Every day | ORAL | Status: DC
  Administered 2017-10-06 – 2017-10-10 (×5): 10 mg via ORAL
  Filled 2017-10-06 (×5): qty 1

## 2017-10-06 MED ORDER — MOMETASONE FURO-FORMOTEROL FUM 200-5 MCG/ACT IN AERO
2.0000 | INHALATION_SPRAY | Freq: Two times a day (BID) | RESPIRATORY_TRACT | Status: DC
  Administered 2017-10-06 – 2017-10-09 (×7): 2 via RESPIRATORY_TRACT
  Filled 2017-10-06: qty 8.8

## 2017-10-06 MED ORDER — OXYCODONE HCL 5 MG PO TABS
5.0000 mg | ORAL_TABLET | ORAL | Status: DC | PRN
  Administered 2017-10-08 – 2017-10-10 (×6): 5 mg via ORAL
  Filled 2017-10-06 (×5): qty 1

## 2017-10-06 MED ORDER — ACETAMINOPHEN 325 MG PO TABS
650.0000 mg | ORAL_TABLET | Freq: Four times a day (QID) | ORAL | Status: DC | PRN
  Administered 2017-10-08 – 2017-10-09 (×2): 650 mg via ORAL
  Filled 2017-10-06: qty 2

## 2017-10-06 MED ORDER — SODIUM CHLORIDE 0.9% FLUSH
3.0000 mL | Freq: Two times a day (BID) | INTRAVENOUS | Status: DC
  Administered 2017-10-06 – 2017-10-09 (×6): 3 mL via INTRAVENOUS

## 2017-10-06 MED ORDER — NEPRO/CARBSTEADY PO LIQD
237.0000 mL | Freq: Every day | ORAL | Status: DC
  Administered 2017-10-10: 237 mL via ORAL
  Filled 2017-10-06 (×6): qty 237

## 2017-10-06 MED ORDER — ONDANSETRON HCL 4 MG/2ML IJ SOLN
4.0000 mg | Freq: Four times a day (QID) | INTRAMUSCULAR | Status: DC | PRN
  Administered 2017-10-06: 4 mg via INTRAVENOUS
  Filled 2017-10-06: qty 2

## 2017-10-06 MED ORDER — HEPARIN SODIUM (PORCINE) 5000 UNIT/ML IJ SOLN
5000.0000 [IU] | Freq: Three times a day (TID) | INTRAMUSCULAR | Status: DC
  Administered 2017-10-06 – 2017-10-09 (×12): 5000 [IU] via SUBCUTANEOUS
  Filled 2017-10-06 (×10): qty 1

## 2017-10-06 MED ORDER — INSULIN ASPART 100 UNIT/ML ~~LOC~~ SOLN: 0.0000 [IU] | Freq: Every day | SUBCUTANEOUS | Status: DC

## 2017-10-06 MED ORDER — ONDANSETRON HCL 4 MG PO TABS: 4.0000 mg | ORAL_TABLET | Freq: Four times a day (QID) | ORAL | Status: DC | PRN

## 2017-10-06 MED ORDER — SEVELAMER CARBONATE 800 MG PO TABS
800.0000 mg | ORAL_TABLET | Freq: Three times a day (TID) | ORAL | Status: DC
  Administered 2017-10-06 – 2017-10-07 (×3): 800 mg via ORAL
  Filled 2017-10-06 (×6): qty 1

## 2017-10-06 MED ORDER — LIDOCAINE HCL 1 % IJ SOLN
INTRAMUSCULAR | Status: AC | PRN
  Administered 2017-10-06: 10 mL

## 2017-10-06 MED ORDER — ALBUTEROL SULFATE (2.5 MG/3ML) 0.083% IN NEBU: 2.5000 mg | INHALATION_SOLUTION | RESPIRATORY_TRACT | Status: DC | PRN

## 2017-10-06 MED ORDER — INSULIN ASPART 100 UNIT/ML ~~LOC~~ SOLN
0.0000 [IU] | Freq: Three times a day (TID) | SUBCUTANEOUS | Status: DC
  Administered 2017-10-06: 0 [IU] via SUBCUTANEOUS
  Administered 2017-10-08: 1 [IU] via SUBCUTANEOUS
  Filled 2017-10-06: qty 1

## 2017-10-06 MED ORDER — SODIUM CHLORIDE 0.9% FLUSH: 3.0000 mL | INTRAVENOUS | Status: DC | PRN

## 2017-10-06 MED ORDER — ACETAMINOPHEN 650 MG RE SUPP: 650.0000 mg | Freq: Four times a day (QID) | RECTAL | Status: DC | PRN

## 2017-10-06 MED ORDER — CIPROFLOXACIN IN D5W 200 MG/100ML IV SOLN
200.0000 mg | INTRAVENOUS | Status: DC
  Administered 2017-10-06 – 2017-10-08 (×3): 200 mg via INTRAVENOUS
  Filled 2017-10-06 (×3): qty 100

## 2017-10-06 MED ORDER — PROMETHAZINE HCL 25 MG/ML IJ SOLN
12.5000 mg | Freq: Once | INTRAMUSCULAR | Status: AC
  Administered 2017-10-06: 12.5 mg via INTRAVENOUS

## 2017-10-06 MED ORDER — PREGABALIN 75 MG PO CAPS
75.0000 mg | ORAL_CAPSULE | Freq: Two times a day (BID) | ORAL | Status: DC
  Administered 2017-10-06 – 2017-10-09 (×8): 75 mg via ORAL
  Filled 2017-10-06 (×6): qty 1
  Filled 2017-10-06: qty 3
  Filled 2017-10-06 (×2): qty 1

## 2017-10-06 MED ORDER — FOLIC ACID 1 MG PO TABS
1.0000 mg | ORAL_TABLET | Freq: Every day | ORAL | Status: DC
  Administered 2017-10-06 – 2017-10-07 (×2): 1 mg via ORAL
  Filled 2017-10-06 (×2): qty 1

## 2017-10-06 MED ORDER — LIDOCAINE 2% (20 MG/ML) 5 ML SYRINGE
INTRAMUSCULAR | Status: AC
  Filled 2017-10-06: qty 10

## 2017-10-06 MED ORDER — SENNOSIDES-DOCUSATE SODIUM 8.6-50 MG PO TABS
1.0000 | ORAL_TABLET | Freq: Two times a day (BID) | ORAL | Status: DC
  Administered 2017-10-06 – 2017-10-09 (×6): 1 via ORAL
  Filled 2017-10-06 (×8): qty 1

## 2017-10-06 NOTE — ED Notes (Addendum)
Pt refusing to wear non slip socks however pt verbalized understanding that she will call out if she needs to get out of bed. Pt A&Ox4, sitting on the side of the bed at this time with her feet flat on the floor. Pt states "I don't need or want to get up, I just want to sit on the side." Pt reports she will call staff if she needs to get up for some reason. DNR wristband verified and in place

## 2017-10-06 NOTE — ED Notes (Signed)
Per IR, pt to be transported to IR at approx 12 PM for more fluid to be drained from abd

## 2017-10-06 NOTE — ED Notes (Signed)
Renal diet tray ordered 

## 2017-10-06 NOTE — H&P (Signed)
History and Physical    Lindsey Sutton WUJ:[id number] DOB: 12/24/1945 DOA: 10/05/2017  PCP: Pola CornBrown, Rita Harbison, NP   Patient coming from: Home  Chief Complaint: SOB, abdominal distension, nausea, malaise   HPI: Lindsey Sutton is a 72 y.o. female with medical history significant for chronic pain, depression, nonalcoholic liver disease, type 2 diabetes mellitus, and end-stage renal disease with poor adherence with dialysis, now presenting to the emergency department for evaluation of shortness of breath, nausea, abdominal distention, and malaise.  Patient is accompanied by family who reports that she has been very poorly adherent with her dialysis schedule for the past month or more, usually only going once a week or so, and often leaving early.  Patient reports the insidious development of dyspnea, nausea, and has had some nonbloody emesis.  Denies chest pain or palpitations and denies fevers or chills.  No significant abdominal pain, though abdomen has become increasingly distended.  ED Course: Upon arrival to the ED, patient is found to be afebrile, saturating adequately on room air, tachypneic, and with vitals otherwise normal.  EKG features atrial fibrillation with nonspecific IVCD.  Chest x-ray is notable for cardiomegaly with pulmonary vascular congestion.  Chemistry panel reveals a mild hyponatremia, normal bicarbonate, anion gap 19, and BUN 36.  CBC is notable for a normocytic anemia with hemoglobin of 11.3 and a chronic thrombocytopenia with platelets 109,000.  Patient was treated with Zofran, Phenergan, and Ativan in the emergency department.  She remains dyspneic though hemodynamically stable and not in acute distress.  She will be observed on the telemetry unit for ongoing evaluation and management of shortness of breath suspected secondary to marked abdominal ascites.  Review of Systems:  All other systems reviewed and apart from HPI, are negative.  Past Medical History:  Diagnosis  Date  . A-fib (HCC)   . Anxiety   . Arthritis    "legs" (04/20/2017)  . COPD (chronic obstructive pulmonary disease) (HCC)   . Depression   . ESRD (end stage renal disease) on dialysis (HCC)    "TTS; [facility]; [place]" (04/20/2017)  . Heart murmur   . History of blood transfusion    related to childbirth  . Iron deficiency anemia   . NAFLD (nonalcoholic fatty liver disease) 9/5/62135/01/2017  . Obesity (BMI 30-39.9) 01/20/2017  . Pacemaker   . Pulmonary edema   . Secondary hyperparathyroidism of renal origin (HCC) 01/22/2017  . Systolic heart failure (HCC)   . Thrombocytopenia (HCC) 01/21/2017  . Type II diabetes mellitus (HCC)     Past Surgical History:  Procedure Laterality Date  . AV FISTULA PLACEMENT Left ~ 2003  . AV FISTULA REPAIR Left X 2  . CARDIAC CATHETERIZATION    . CATARACT EXTRACTION W/ INTRAOCULAR LENS  IMPLANT, BILATERAL Bilateral 2017  . FRACTURE SURGERY    . INSERT / REPLACE / REMOVE PACEMAKER  ~ 2012  . INTRAMEDULLARY (IM) NAIL INTERTROCHANTERIC Left 01/20/2017   Procedure: INTRAMEDULLARY (IM) NAIL INTERTROCHANTRIC;  Surgeon: Tarry KosXu, Naiping M, MD;  Location: [facility] OR;  Service: Orthopedics;  Laterality: Left;     reports that she quit smoking about 21 years ago. Her smoking use included cigarettes. She has a 33.00 pack-year smoking history. she has never used smokeless tobacco. She reports that she does not drink alcohol or use drugs.  Allergies  Allergen Reactions  . Penicillins Swelling    Has patient had a PCN reaction causing immediate rash, facial/tongue/throat swelling, SOB or lightheadedness with hypotension: Yes Has patient had a PCN reaction causing  severe rash involving mucus membranes or skin necrosis: No Has patient had a PCN reaction that required hospitalization No Has patient had a PCN reaction occurring within the last 10 years: No If all of the above answers are "NO", then may proceed with Cephalosporin use.     Family History  Problem Relation Age of Onset   . Diabetes Mother   . Hypertension Father      Prior to Admission medications   Medication Sig Start Date End Date Taking? Authorizing Provider  acetaminophen (TYLENOL) 650 MG CR tablet Take 650 mg by mouth every 8 (eight) hours as needed for pain.    [provider]  albuterol (PROVENTIL) (2.5 MG/3ML) 0.083% nebulizer solution Take 3 mLs (2.5 mg total) by nebulization every 4 (four) hours as needed for wheezing or shortness of breath. 04/25/17   Johnson, Clanford L, MD  aspirin EC 81 MG tablet Take 81 mg by mouth daily.    [provider]  budesonide-formoterol (SYMBICORT) 160-4.5 MCG/ACT inhaler Inhale 2 puffs into the lungs 2 (two) times daily.    [provider]  cinacalcet (SENSIPAR) 30 MG tablet Take 30 mg by mouth daily with supper.    [provider]  escitalopram (LEXAPRO) 10 MG tablet Take 1 tablet (10 mg total) by mouth daily. 04/25/17   Johnson, Clanford L, MD  folic acid (FOLVITE) 1 MG tablet Take 1 tablet (1 mg total) by mouth daily. 04/25/17   Johnson, Clanford L, MD  linagliptin (TRADJENTA) 5 MG TABS tablet Take 1 tablet (5 mg total) by mouth daily. 04/25/17   Cleora Fleet, MD  Nutritional Supplements (FEEDING SUPPLEMENT, NEPRO CARB STEADY,) LIQD Take 237 mLs by mouth daily at 3 pm. 04/25/17   Laural Benes, Clanford L, MD  oxyCODONE (OXY IR/ROXICODONE) 5 MG immediate release tablet Take 1 tablet (5 mg total) by mouth every 4 (four) hours as needed for severe pain. 04/25/17   Johnson, Clanford L, MD  polyethylene glycol (MIRALAX / GLYCOLAX) packet Take 17 g by mouth daily. 04/25/17   Johnson, Clanford L, MD  pregabalin (LYRICA) 75 MG capsule Take 75 mg by mouth 2 (two) times daily.    [provider]  senna-docusate (SENOKOT-S) 8.6-50 MG tablet Take 1 tablet by mouth 2 (two) times daily. 01/26/17   Rai, Ripudeep Kirtland Bouchard, MD  sevelamer carbonate (RENVELA) 800 MG tablet Take 800 mg by mouth 3 (three) times daily with meals.    [provider]    traZODone (DESYREL) 50 MG tablet Take 50 mg by mouth at bedtime.    [provider]    Physical Exam: Vitals:   10/06/17 0045 10/06/17 0130 10/06/17 0145 10/06/17 0200  BP: (!) 143/83 (!) 141/80 105/74 116/81  Pulse: (!) 101 99 78 95  Resp: (!) 23 (!) 24 (!) 21 (!) 26  Temp:      TempSrc:      SpO2: 97% 92% 92% 92%  Height:          Constitutional: Not in acute respiratory distress, restless, uncomfortable Eyes: PERTLA, lids and conjunctivae normal ENMT: Mucous membranes are moist. Posterior pharynx clear of any exudate or lesions.   Neck: normal, supple, no masses, no thyromegaly Respiratory: clear to auscultation bilaterally, no wheezing, no crackles. Normal respiratory effort.   Cardiovascular: S1 & S2 heard, regular rate and rhythm. No significant JVD. Abdomen: Marked distension but soft, no tenderness. Bowel sounds active.  Musculoskeletal: no clubbing / cyanosis. No joint deformity upper and lower extremities.  Skin: no significant rashes, lesions, ulcers. Warm, dry, well-perfused. Neurologic: CN 2-12 grossly intact. Sensation intact. Strength 5/5 in all 4 limbs.  Psychiatric: Alert and oriented x 3. Anxious, restless.     Labs on Admission: I have personally reviewed following labs and imaging studies  CBC: Recent Labs  Lab 10/05/17 1947  WBC 4.2  HGB 11.3*  HCT 36.4  MCV 91.5  PLT 109*   Basic Metabolic Panel: Recent Labs  Lab 10/05/17 1947  NA 134*  K 3.7  CL 92*  CO2 23  GLUCOSE 136*  BUN 36*  CREATININE 7.54*  CALCIUM 8.4*   GFR: CrCl cannot be calculated (Unknown ideal weight.). Liver Function Tests: Recent Labs  Lab 10/05/17 1947  AST 13*  ALT 6*  ALKPHOS 66  BILITOT 1.6*  PROT 6.5  ALBUMIN 3.0*   Recent Labs  Lab 10/05/17 1947  LIPASE 27   No results for input(s): AMMONIA in the last 168 hours. Coagulation Profile: No results for input(s): INR, PROTIME in the last 168 hours. Cardiac Enzymes: No results for  input(s): CKTOTAL, CKMB, CKMBINDEX, TROPONINI in the last 168 hours. BNP (last 3 results) No results for input(s): PROBNP in the last 8760 hours. HbA1C: No results for input(s): HGBA1C in the last 72 hours. CBG: No results for input(s): GLUCAP in the last 168 hours. Lipid Profile: No results for input(s): CHOL, HDL, LDLCALC, TRIG, CHOLHDL, LDLDIRECT in the last 72 hours. Thyroid Function Tests: No results for input(s): TSH, T4TOTAL, FREET4, T3FREE, THYROIDAB in the last 72 hours. Anemia Panel: No results for input(s): VITAMINB12, FOLATE, FERRITIN, TIBC, IRON, RETICCTPCT in the last 72 hours. Urine analysis:    Component Value Date/Time   COLORURINE AMBER (A) 01/25/2017 2240   APPEARANCEUR CLOUDY (A) 01/25/2017 2240   LABSPEC 1.014 01/25/2017 2240   PHURINE 6.0 01/25/2017 2240   GLUCOSEU NEGATIVE 01/25/2017 2240   HGBUR MODERATE (A) 01/25/2017 2240   BILIRUBINUR NEGATIVE 01/25/2017 2240   KETONESUR 5 (A) 01/25/2017 2240   PROTEINUR 100 (A) 01/25/2017 2240   NITRITE NEGATIVE 01/25/2017 2240   LEUKOCYTESUR SMALL (A) 01/25/2017 2240   Sepsis Labs: @LABRCNTIP (procalcitonin:4,lacticidven:4) ) Recent Results (from the past 240 hour(s))  Body fluid culture     Status: None (Preliminary result)   Collection Time: 10/05/17 11:04 PM  Result Value Ref Range Status   Specimen Description ABDOMEN PERITONEAL  Final   Special Requests NONE  Final   Gram Stain   Final    FEW WBC PRESENT, PREDOMINANTLY MONONUCLEAR NO ORGANISMS SEEN    Culture PENDING  Incomplete   Report Status PENDING  Incomplete     Radiological Exams on Admission: Dg Abdomen Acute W/chest  Result Date: 10/06/2017 CLINICAL DATA:  72 year old female with shortness of breath and vomiting. EXAM: DG ABDOMEN ACUTE W/ 1V CHEST COMPARISON:  Chest radiograph dated 04/20/2017 FINDINGS: There is stable cardiomegaly with vascular congestion. Left lung base densities, likely atelectatic changes. A small left pleural effusion is  less likely but not entirely excluded. No pneumothorax. There is atherosclerotic calcification of the aortic arch. The aorta is mildly tortuous. Left pectoral pacemaker device noted. Evaluation of the bowel is limited due to patient's body habitus. No definite bowel dilatation. Vascular calcification noted in the upper abdomen. There is vascular calcification versus stent in the distal aorta and common iliac arteries. Left femoral intramedullary rod. IMPRESSION: 1. Cardiomegaly with mild vascular calcification. 2. No focal consolidation. Minimal left lung base atelectasis. Trace left pleural effusion is less likely. 3. No bowel  dilatation. Electronically Signed   By: Elgie Collard M.D.   On: 10/06/2017 00:23    EKG: Independently reviewed. Atrial fibrillation, non-specific IVCD.   Assessment/Plan  1. Acute respiratory distress  - Presents with SOB, malaise, nausea  - No fever or leukocytosis; CXR with pulmonary vascular congestion  - Lungs CTAB  - Likely secondary to ascites with marked distension  - IR consulted for paracentesis  - Continue supportive care with prn supplemental O2   2. ESRD  - Has been non-adherent with dialysis, missing many sessions and leaving early  - No acidosis, significant electrolyte disturbance, pulm edema, or uremia  - SLIV, fluid-restricted renal diet, repeat chem panel in am   3. Abdominal ascites  - Has hx of non-alcoholic liver disease with ascites  - Marked distension on admission, likely causing her SOB as noted above  - No fever, leukocytosis, or abd pain to suggest SBP - Diagnostic paracentesis performed in ED with gram-stain and culture pending  - IR consulted for paracentesis   4. Chronic pain  - Stable  - Continue current regimen with Lyrica, prn Oxy IR    5. Depression  - Continue Lexapro and trazodone   6. Type II DM  - A1c was only 5.0% in August 2018  - She takes Tradjenta at home, held on admission  - Follow CBG's and use  low-intensity SSI as needed  7. Atrial fibrillation  - In rate-controlled a fib on admission  - CHADS-VASc is at least 35 (age, gender, DM, HTN)  - Not anticoagulated, possibly d/t fall-risk    DVT prophylaxis: sq heparin  Code Status: DNR  Family Communication: Son and daughter updated at bedside Disposition Plan: Observe on telemetry Consults called: None Admission status: Observation    Briscoe Deutscher, MD [facility] Pager [phone]  If 7PM-7AM, please contact night-coverage [email or web address] Password [email or web address]  10/06/2017, 2:42 AM

## 2017-10-06 NOTE — Procedures (Signed)
PROCEDURE SUMMARY:  Successful US guided paracentesis from right lateral abdomen.  Yielded 6.0 liters of yellow fluid.  No immediate complications. Procedure was stopped at 6.0 liters as this was patient's first paracentesis. Pt tolerated well.   Specimen was not sent for labs.  Hoyt KochKacie Sue-Ellen Jace Dowe PA-C 10/06/2017 1:13 PM

## 2017-10-06 NOTE — ED Notes (Signed)
Pt still in IR.

## 2017-10-06 NOTE — Progress Notes (Signed)
PROGRESS NOTE    Lindsey Sutton  ZOX:[id number] DOB: 1946-08-20 DOA: 10/05/2017 PCP: [name], NP  Outpatient Specialists:   Brief Narrative: Patient is a 72 year old female with past medical history significant for chronic pain, depression, nonalcoholic liver disease, type 2 diabetes mellitus, and end-stage renal disease with poor adherence with dialysis, now presenting to the emergency department for evaluation of shortness of breath, nausea, abdominal distention, and malaise.  Patient was accompanied by family who reported that she had been very poorly adherent with her dialysis schedule for the past month or more, usually only going once a week or so, and often leaving early.  Patient reports the insidious development of dyspnea, nausea, and has had some nonbloody emesis.  Denies chest pain or palpitations and denies fevers or chills.  No significant abdominal pain, though abdomen has become increasingly distended. Patient underwent paracentesis today and 6 Liters of fluid was removed  Assessment & Plan:   Principal Problem:   Acute respiratory distress Active Problems:   Iron deficiency anemia   A-fib (HCC)   ESRD (end stage renal disease) (HCC)   Ascites   DM (diabetes mellitus), type 2 with renal complications (HCC)   Essential hypertension   NAFLD (nonalcoholic fatty liver disease)   Thrombocytopenia (HCC)   Patient is s/P paracentesis Follow ascitic fluid analysis Start antibiotics Continue HD Optimize BP and Blood sugar Monitor platelets  Guarded prognosis  DVT prophylaxis: Mineral Heparin Code Status: DNR Family Communication:  Disposition Plan: Depends on hospital course   Consultants:   IR  Procedures:   Paracentesis  Antimicrobials:   Start IV Cipro    Subjective: Abdominal distention Nausea SOB secondary to massive ascites.  Objective: Vitals:   10/06/17 1346 10/06/17 1400 10/06/17 1609 10/06/17 1614  BP:  (!) 141/78  (!) 109/53    Pulse: 77 68  61  Resp: 15 15  16   Temp:    97.6 F (36.4 C)  TempSrc:    Oral  SpO2: 93% 95%  100%  Weight:   88.6 kg (195 lb 5.2 oz)   Height:        Intake/Output Summary (Last 24 hours) at 10/06/2017 1635 Last data filed at 10/06/2017 1424 Gross per 24 hour  Intake 225 ml  Output 501 ml  Net -276 ml   Filed Weights   10/06/17 1609  Weight: 88.6 kg (195 lb 5.2 oz)    Examination:  General exam: Appears chronically ill looking. Massive ascites, with discomfort.  Respiratory system: Decreased to auscultation. Cardiovascular system: S1 & S2. No pedal edema. Gastrointestinal system: Abdomen is  Very distended. Dull to percussion. Organs are difficult to assess.  Central nervous system: Alert and oriented. No focal neurological deficits. Extremities: No leg edema  Data Reviewed: I have personally reviewed following labs and imaging studies  CBC: Recent Labs  Lab 10/05/17 1947 10/06/17 0245  WBC 4.2 5.1  HGB 11.3* 10.7*  HCT 36.4 35.0*  MCV 91.5 91.9  PLT 109* 114*   Basic Metabolic Panel: Recent Labs  Lab 10/05/17 1947 10/06/17 0245  NA 134* 134*  K 3.7 3.6  CL 92* 92*  CO2 23 23  GLUCOSE 136* 104*  BUN 36* 36*  CREATININE 7.54* 7.90*  CALCIUM 8.4* 8.4*   GFR: Estimated Creatinine Clearance: 7.5 mL/min (A) (by C-G formula based on SCr of 7.9 mg/dL (H)). Liver Function Tests: Recent Labs  Lab 10/05/17 1947 10/06/17 0245  AST 13* 13*  ALT 6* 6*  ALKPHOS 66 54  BILITOT 1.6* 1.4*  PROT 6.5 6.1*  ALBUMIN 3.0* 2.9*   Recent Labs  Lab 10/05/17 1947  LIPASE 27   No results for input(s): AMMONIA in the last 168 hours. Coagulation Profile: No results for input(s): INR, PROTIME in the last 168 hours. Cardiac Enzymes: No results for input(s): CKTOTAL, CKMB, CKMBINDEX, TROPONINI in the last 168 hours. BNP (last 3 results) No results for input(s): PROBNP in the last 8760 hours. HbA1C: No results for input(s): HGBA1C in the last 72  hours. CBG: Recent Labs  Lab 10/06/17 0821 10/06/17 1143  GLUCAP 102* 92   Lipid Profile: No results for input(s): CHOL, HDL, LDLCALC, TRIG, CHOLHDL, LDLDIRECT in the last 72 hours. Thyroid Function Tests: No results for input(s): TSH, T4TOTAL, FREET4, T3FREE, THYROIDAB in the last 72 hours. Anemia Panel: No results for input(s): VITAMINB12, FOLATE, FERRITIN, TIBC, IRON, RETICCTPCT in the last 72 hours. Urine analysis:    Component Value Date/Time   COLORURINE AMBER (A) 01/25/2017 2240   APPEARANCEUR CLOUDY (A) 01/25/2017 2240   LABSPEC 1.014 01/25/2017 2240   PHURINE 6.0 01/25/2017 2240   GLUCOSEU NEGATIVE 01/25/2017 2240   HGBUR MODERATE (A) 01/25/2017 2240   BILIRUBINUR NEGATIVE 01/25/2017 2240   KETONESUR 5 (A) 01/25/2017 2240   PROTEINUR 100 (A) 01/25/2017 2240   NITRITE NEGATIVE 01/25/2017 2240   LEUKOCYTESUR SMALL (A) 01/25/2017 2240   Sepsis Labs: @LABRCNTIP (procalcitonin:4,lacticidven:4)  ) Recent Results (from the past 240 hour(s))  Body fluid culture     Status: None (Preliminary result)   Collection Time: 10/05/17 11:04 PM  Result Value Ref Range Status   Specimen Description ABDOMEN PERITONEAL  Final   Special Requests NONE  Final   Gram Stain   Final    FEW WBC PRESENT, PREDOMINANTLY MONONUCLEAR NO ORGANISMS SEEN    Culture PENDING  Incomplete   Report Status PENDING  Incomplete         Radiology Studies: Dg Abdomen Acute W/chest  Result Date: 10/06/2017 CLINICAL DATA:  72 year old female with shortness of breath and vomiting. EXAM: DG ABDOMEN ACUTE W/ 1V CHEST COMPARISON:  Chest radiograph dated 04/20/2017 FINDINGS: There is stable cardiomegaly with vascular congestion. Left lung base densities, likely atelectatic changes. A small left pleural effusion is less likely but not entirely excluded. No pneumothorax. There is atherosclerotic calcification of the aortic arch. The aorta is mildly tortuous. Left pectoral pacemaker device noted. Evaluation of  the bowel is limited due to patient's body habitus. No definite bowel dilatation. Vascular calcification noted in the upper abdomen. There is vascular calcification versus stent in the distal aorta and common iliac arteries. Left femoral intramedullary rod. IMPRESSION: 1. Cardiomegaly with mild vascular calcification. 2. No focal consolidation. Minimal left lung base atelectasis. Trace left pleural effusion is less likely. 3. No bowel dilatation. Electronically Signed   By: Elgie Collard M.D.   On: 10/06/2017 00:23   Ir Paracentesis  Result Date: 10/06/2017 INDICATION: Patient with abdominal distention, ascites. Request is made for therapeutic paracentesis. Patient will be set as a 6 L limit as this is her first paracentesis. EXAM: ULTRASOUND GUIDED THERAPEUTIC PARACENTESIS MEDICATIONS: 10 mL 2% lidocaine COMPLICATIONS: None immediate. PROCEDURE: Informed written consent was obtained from the patient after a discussion of the risks, benefits and alternatives to treatment. A timeout was performed prior to the initiation of the procedure. Initial ultrasound scanning demonstrates a large amount of ascites within the right lateral abdomen. The right lateral abdomen was prepped and draped in the usual sterile fashion. 2% lidocaine was  used for local anesthesia. Following this, a 19 gauge, 7-cm, Yueh catheter was introduced. An ultrasound image was saved for documentation purposes. The paracentesis was performed. The catheter was removed and a dressing was applied. The patient tolerated the procedure well without immediate post procedural complication. FINDINGS: A total of approximately 6.0 L of yellow fluid was removed. IMPRESSION: Successful ultrasound-guided therapeutic paracentesis yielding 6.0 liters of peritoneal fluid. Read by: Loyce DysKacie Matthews PA-C Electronically Signed   By: Malachy MoanHeath  McCullough M.D.   On: 10/06/2017 14:49        Scheduled Meds: . aspirin EC  81 mg Oral Daily  . cinacalcet  30 mg Oral  Q supper  . escitalopram  10 mg Oral Daily  . feeding supplement (NEPRO CARB STEADY)  237 mL Oral Q1500  . folic acid  1 mg Oral Daily  . heparin  5,000 Units Subcutaneous Q8H  . insulin aspart  0-5 Units Subcutaneous QHS  . insulin aspart  0-9 Units Subcutaneous TID WC  . lidocaine      . mometasone-formoterol  2 puff Inhalation BID  . polyethylene glycol  17 g Oral Daily  . pregabalin  75 mg Oral BID  . senna-docusate  1 tablet Oral BID  . sevelamer carbonate  800 mg Oral TID WC  . sodium chloride flush  3 mL Intravenous Q12H  . sodium chloride flush  3 mL Intravenous Q12H  . traZODone  50 mg Oral QHS   Continuous Infusions: . sodium chloride    . albumin human       LOS: 0 days    Time spent: 40 Mins    Berton MountSylvester Zohra Clavel, MD  [facility] Pager #: [phone] 7PM-7AM contact night coverage as above

## 2017-10-06 NOTE — ED Notes (Signed)
Patient unable to urinate at this time. 

## 2017-10-06 NOTE — ED Notes (Signed)
Patient repositioned on bed for comfort . Respirations unlabored , iv site intact , nausea/emesis relieved . Family at bedside , waiting for admitting MD .

## 2017-10-06 NOTE — Progress Notes (Signed)
Tele sitter discontinued, not aprorpriate for patients behavior. Patient alert and oriented, not trying to get out  Of the bed, bed alarm is sufficient for safety.

## 2017-10-06 NOTE — ED Notes (Signed)
Pt going to IR for paracentesis.

## 2017-10-06 NOTE — Progress Notes (Signed)
Received from ED, patient alert and oriented, no complaints of any pain or discomfort. Abdomen distended, band aid on RL abdomen  in place ( paracentesis site) no bleeding noted. Refuses non skid socks and likes to sit at the edge of the bed.

## 2017-10-06 NOTE — ED Notes (Signed)
Pt given sprite zero 

## 2017-10-06 NOTE — ED Notes (Signed)
Pt placed on hospital bed for comfort; bed alarm on due to patient attempting to sit on the side of the bed and is a high fall risk. Tele-sitter machine also at bedside

## 2017-10-06 NOTE — ED Notes (Signed)
Patient transported to IR 

## 2017-10-06 NOTE — ED Notes (Signed)
Admitting paged by this RN to clarify NPO order

## 2017-10-07 ENCOUNTER — Inpatient Hospital Stay (HOSPITAL_COMMUNITY): Payer: Medicare Other

## 2017-10-07 LAB — GLUCOSE, CAPILLARY
GLUCOSE-CAPILLARY: 82 mg/dL (ref 65–99)
GLUCOSE-CAPILLARY: 105 mg/dL — AB (ref 65–99)
Glucose-Capillary: 86 mg/dL (ref 65–99)
Glucose-Capillary: 103 mg/dL — ABNORMAL HIGH (ref 65–99)

## 2017-10-07 MED ORDER — SEVELAMER CARBONATE 800 MG PO TABS
3200.0000 mg | ORAL_TABLET | Freq: Three times a day (TID) | ORAL | Status: DC
  Administered 2017-10-07 – 2017-10-09 (×5): 3200 mg via ORAL
  Filled 2017-10-07 (×5): qty 4

## 2017-10-07 MED ORDER — RENA-VITE PO TABS
1.0000 | ORAL_TABLET | Freq: Every day | ORAL | Status: DC
  Administered 2017-10-07 – 2017-10-09 (×3): 1 via ORAL
  Filled 2017-10-07 (×3): qty 1

## 2017-10-07 MED ORDER — DOXERCALCIFEROL 4 MCG/2ML IV SOLN
4.0000 ug | INTRAVENOUS | Status: DC
  Administered 2017-10-10: 4 ug via INTRAVENOUS
  Filled 2017-10-07: qty 2

## 2017-10-07 MED ORDER — LIDOCAINE HCL (PF) 1 % IJ SOLN
INTRAMUSCULAR | Status: AC
  Filled 2017-10-07: qty 30

## 2017-10-07 MED ORDER — IOPAMIDOL (ISOVUE-300) INJECTION 61%
INTRAVENOUS | Status: AC
  Filled 2017-10-07: qty 100

## 2017-10-07 MED ORDER — DARBEPOETIN ALFA 100 MCG/0.5ML IJ SOSY: 100.0000 ug | PREFILLED_SYRINGE | INTRAMUSCULAR | Status: DC

## 2017-10-07 MED ORDER — ALBUMIN HUMAN 25 % IV SOLN
25.0000 g | Freq: Once | INTRAVENOUS | Status: DC
  Filled 2017-10-07: qty 100

## 2017-10-07 MED ORDER — IOPAMIDOL (ISOVUE-300) INJECTION 61%
INTRAVENOUS | Status: AC
  Filled 2017-10-07: qty 30

## 2017-10-07 NOTE — Procedures (Signed)
PROCEDURE SUMMARY:  Successful US guided paracentesis from right lateral abdomen.  Yielded 10.0 liters of clear, yellow fluid.  No immediate complications. Was able to take 10 liters of fluid today.  Patient still with some ascites remaining.  Pt tolerated well.   Specimen was not sent for labs.  Hoyt KochKacie Sue-Ellen Matthews PA-C 10/07/2017 1:30 PM

## 2017-10-07 NOTE — Progress Notes (Signed)
Received patient post paracentesis, alert and oriented no complaints of any pain or discomfort. Parasentesis  site  In  Right abdomen   band aid in place, no swelling or bleeding noted. Will monitor accordingly.

## 2017-10-07 NOTE — Consult Note (Signed)
Reason for Consult:ESRD , vol xs uremia Referring Physician: Dr. Charmayne Sheer is an 72 y.o. female.  HPI: 72 yr female with hx DM, HTN, DJD , Afib, AS, NASH, Pacer, Anemia, depression, severe NONADHERENCE, presented 2 d ago with dyspnea.  We were not notified until this am.  Has underwent 2 large vol paracentesis for uremic ascites, and not referrred for dialsis.  Has been at [facility] since 8/18, and misses tx at least 1x /wk and stays 1-2 h the rest.  Progressive DOE, PND, and abdm swelling.  Claims she does not want to die and will come to HD from now on.  Many issues about not coming. Constitutional: weaker, feels bad Eyes: some visual limitation Ears, nose, mouth, throat, and face: negative Respiratory: SOB Cardiovascular: edema of LE, PND, orthop Gastrointestinal: swelling of abdm Genitourinary:negative Integument/breast: skin dry Musculoskeletal:negative Behavioral/Psych: depression Endocrine: DM Allergic/Immunologic: negative, PCN  Dialyzes at [facility] on TTS since 8/18. Primary Nephrologist Justin Mend. EDW 95 kg. HD Bath 2.25/2 K, Dialyzer 180 NR, Heparin 4000. Access LLA AVF.  Past Medical History:  Diagnosis Date  . A-fib ([place])   . Anxiety   . Arthritis    "legs" (04/20/2017)  . COPD (chronic obstructive pulmonary disease) ([place])   . Depression   . ESRD (end stage renal disease) on dialysis ([place])    "TTS; [facility]; [place]" (04/20/2017)  . Heart murmur   . History of blood transfusion    related to childbirth  . Iron deficiency anemia   . NAFLD (nonalcoholic fatty liver disease) 01/21/2017  . Obesity (BMI 30-39.9) 01/20/2017  . Pacemaker   . Pulmonary edema   . Secondary hyperparathyroidism of renal origin ([place]) 01/22/2017  . Systolic heart failure ([place])   . Thrombocytopenia ([place]) 01/21/2017  . Type II diabetes mellitus ([place])     Past Surgical History:  Procedure Laterality Date  . AV FISTULA PLACEMENT Left ~ 2003  . AV FISTULA REPAIR Left X 2  . CARDIAC CATHETERIZATION     . CATARACT EXTRACTION W/ INTRAOCULAR LENS  IMPLANT, BILATERAL Bilateral 2017  . FRACTURE SURGERY    . INSERT / REPLACE / REMOVE PACEMAKER  ~ 2012  . INTRAMEDULLARY (IM) NAIL INTERTROCHANTERIC Left 01/20/2017   Procedure: INTRAMEDULLARY (IM) NAIL INTERTROCHANTRIC;  Surgeon: Leandrew Koyanagi, MD;  Location: [place];  Service: Orthopedics;  Laterality: Left;  . IR PARACENTESIS  10/06/2017    Family History  Problem Relation Age of Onset  . Diabetes Mother   . Hypertension Father     Social History:  reports that she quit smoking about 21 years ago. Her smoking use included cigarettes. She has a 33.00 pack-year smoking history. she has never used smokeless tobacco. She reports that she does not drink alcohol or use drugs.  Allergies:  Allergies  Allergen Reactions  . Penicillins Swelling    Has patient had a PCN reaction causing immediate rash, facial/tongue/throat swelling, SOB or lightheadedness with hypotension: Yes Has patient had a PCN reaction causing severe rash involving mucus membranes or skin necrosis: No Has patient had a PCN reaction that required hospitalization No Has patient had a PCN reaction occurring within the last 10 years: No If all of the above answers are "NO", then may proceed with Cephalosporin use.     Medications:  I have reviewed the patient's current medications. Prior to Admission:  Medications Prior to Admission  Medication Sig Dispense Refill Last Dose  . albuterol (PROVENTIL) (2.5 MG/3ML) 0.083% nebulizer solution Take 3 mLs (2.5 mg  total) by nebulization every 4 (four) hours as needed for wheezing or shortness of breath. 75 mL 12 unknown at prn  . aspirin EC 81 MG tablet Take 81 mg by mouth daily.   10/05/2017 at Unknown time  . budesonide-formoterol (SYMBICORT) 160-4.5 MCG/ACT inhaler Inhale 2 puffs into the lungs 2 (two) times daily.   10/05/2017 at Unknown time  . cinacalcet (SENSIPAR) 30 MG tablet Take 30 mg by mouth daily with supper.   10/05/2017 at  Unknown time  . escitalopram (LEXAPRO) 10 MG tablet Take 1 tablet (10 mg total) by mouth daily.   10/05/2017 at Unknown time  . folic acid (FOLVITE) 1 MG tablet Take 1 tablet (1 mg total) by mouth daily.   10/05/2017 at Unknown time  . midodrine (PROAMATINE) 5 MG tablet Take 5 mg by mouth 3 (three) times a week. Take 30 minutes prior to dialysis   10/05/2017 at Unknown time  . ondansetron (ZOFRAN-ODT) 4 MG disintegrating tablet Take 4 mg by mouth every 6 (six) hours as needed for nausea or vomiting.   unk  . pantoprazole (PROTONIX) 40 MG tablet Take 40 mg by mouth daily.   10/05/2017 at Unknown time  . polyethylene glycol (MIRALAX / GLYCOLAX) packet Take 17 g by mouth daily. (Patient taking differently: Take 17 g by mouth daily as needed for mild constipation. ) 14 each 0 unk  . pregabalin (LYRICA) 150 MG capsule Take 150 mg by mouth 2 (two) times daily.    10/05/2017 at Unknown time  . sevelamer carbonate (RENVELA) 800 MG tablet Take 800 mg by mouth 3 (three) times daily with meals.   10/05/2017 at Unknown time  . traZODone (DESYREL) 100 MG tablet Take 100 mg by mouth at bedtime.    Past Week at Unknown time  . acetaminophen (TYLENOL) 650 MG CR tablet Take 650 mg by mouth every 8 (eight) hours as needed for pain.   Not Taking at Unknown time  . linagliptin (TRADJENTA) 5 MG TABS tablet Take 1 tablet (5 mg total) by mouth daily. (Patient not taking: Reported on 10/06/2017) 30 tablet  Not Taking at Unknown time  . Nutritional Supplements (FEEDING SUPPLEMENT, NEPRO CARB STEADY,) LIQD Take 237 mLs by mouth daily at 3 pm. (Patient not taking: Reported on 10/06/2017)  0 Not Taking at Unknown time  . senna-docusate (SENOKOT-S) 8.6-50 MG tablet Take 1 tablet by mouth 2 (two) times daily. (Patient taking differently: Take 1 tablet by mouth 2 (two) times daily as needed for mild constipation. )   unk   , Hectoral 4 mcg iv q HD Micera 245mg iv q 2 wk , Venofer 1054miv q wk  Results for orders placed or performed  during the hospital encounter of 10/05/17 (from the past 48 hour(s))  Lipase, blood     Status: None   Collection Time: 10/05/17  7:47 PM  Result Value Ref Range   Lipase 27 11 - 51 U/L  Comprehensive metabolic panel     Status: Abnormal   Collection Time: 10/05/17  7:47 PM  Result Value Ref Range   Sodium 134 (L) 135 - 145 mmol/L   Potassium 3.7 3.5 - 5.1 mmol/L   Chloride 92 (L) 101 - 111 mmol/L   CO2 23 22 - 32 mmol/L   Glucose, Bld 136 (H) 65 - 99 mg/dL   BUN 36 (H) 6 - 20 mg/dL   Creatinine, Ser 7.54 (H) 0.44 - 1.00 mg/dL   Calcium 8.4 (L) 8.9 - 10.3 mg/dL  Total Protein 6.5 6.5 - 8.1 g/dL   Albumin 3.0 (L) 3.5 - 5.0 g/dL   AST 13 (L) 15 - 41 U/L   ALT 6 (L) 14 - 54 U/L   Alkaline Phosphatase 66 38 - 126 U/L   Total Bilirubin 1.6 (H) 0.3 - 1.2 mg/dL   GFR calc non Af Amer 5 (L) >60 mL/min   GFR calc Af Amer 6 (L) >60 mL/min    Comment: (NOTE) The eGFR has been calculated using the CKD EPI equation. This calculation has not been validated in all clinical situations. eGFR's persistently <60 mL/min signify possible Chronic Kidney Disease.    Anion gap 19 (H) 5 - 15  CBC     Status: Abnormal   Collection Time: 10/05/17  7:47 PM  Result Value Ref Range   WBC 4.2 4.0 - 10.5 K/uL   RBC 3.98 3.87 - 5.11 MIL/uL   Hemoglobin 11.3 (L) 12.0 - 15.0 g/dL   HCT 36.4 36.0 - 46.0 %   MCV 91.5 78.0 - 100.0 fL   MCH 28.4 26.0 - 34.0 pg   MCHC 31.0 30.0 - 36.0 g/dL   RDW 16.4 (H) 11.5 - 15.5 %   Platelets 109 (L) 150 - 400 K/uL    Comment: REPEATED TO VERIFY SPECIMEN CHECKED FOR CLOTS PLATELET COUNT CONFIRMED BY SMEAR   I-Stat CG4 Lactic Acid, ED     Status: None   Collection Time: 10/05/17  7:53 PM  Result Value Ref Range   Lactic Acid, Venous 1.67 0.5 - 1.9 mmol/L  Body fluid cell count with differential     Status: Abnormal   Collection Time: 10/05/17 10:57 PM  Result Value Ref Range   Fluid Type-FCT FLUID     Comment: PERITONEAL   Color, Fluid YELLOW YELLOW    Appearance, Fluid HAZY (A) CLEAR   WBC, Fluid 161 0 - 1,000 cu mm   Neutrophil Count, Fluid 0 0 - 25 %   Lymphs, Fluid 22 %   Monocyte-Macrophage-Serous Fluid 78 50 - 90 %   Eos, Fluid 0 %  Glucose, pleural or peritoneal fluid     Status: None   Collection Time: 10/05/17 10:57 PM  Result Value Ref Range   Glucose, Fluid 102 mg/dL    Comment: (NOTE) No normal range established for this test Results should be evaluated in conjunction with serum values    Fluid Type-FGLU PERITONEAL   Body fluid culture     Status: None (Preliminary result)   Collection Time: 10/05/17 11:04 PM  Result Value Ref Range   Specimen Description ABDOMEN PERITONEAL    Special Requests NONE    Gram Stain      FEW WBC PRESENT, PREDOMINANTLY MONONUCLEAR NO ORGANISMS SEEN    Culture NO GROWTH 1 DAY    Report Status PENDING   Comprehensive metabolic panel     Status: Abnormal   Collection Time: 10/06/17  2:45 AM  Result Value Ref Range   Sodium 134 (L) 135 - 145 mmol/L   Potassium 3.6 3.5 - 5.1 mmol/L   Chloride 92 (L) 101 - 111 mmol/L   CO2 23 22 - 32 mmol/L   Glucose, Bld 104 (H) 65 - 99 mg/dL   BUN 36 (H) 6 - 20 mg/dL   Creatinine, Ser 7.90 (H) 0.44 - 1.00 mg/dL   Calcium 8.4 (L) 8.9 - 10.3 mg/dL   Total Protein 6.1 (L) 6.5 - 8.1 g/dL   Albumin 2.9 (L) 3.5 - 5.0 g/dL  AST 13 (L) 15 - 41 U/L   ALT 6 (L) 14 - 54 U/L   Alkaline Phosphatase 54 38 - 126 U/L   Total Bilirubin 1.4 (H) 0.3 - 1.2 mg/dL   GFR calc non Af Amer 5 (L) >60 mL/min   GFR calc Af Amer 5 (L) >60 mL/min    Comment: (NOTE) The eGFR has been calculated using the CKD EPI equation. This calculation has not been validated in all clinical situations. eGFR's persistently <60 mL/min signify possible Chronic Kidney Disease.    Anion gap 19 (H) 5 - 15  CBC     Status: Abnormal   Collection Time: 10/06/17  2:45 AM  Result Value Ref Range   WBC 5.1 4.0 - 10.5 K/uL   RBC 3.81 (L) 3.87 - 5.11 MIL/uL   Hemoglobin 10.7 (L) 12.0 - 15.0 g/dL    HCT 35.0 (L) 36.0 - 46.0 %   MCV 91.9 78.0 - 100.0 fL   MCH 28.1 26.0 - 34.0 pg   MCHC 30.6 30.0 - 36.0 g/dL   RDW 16.3 (H) 11.5 - 15.5 %   Platelets 114 (L) 150 - 400 K/uL    Comment: CONSISTENT WITH PREVIOUS RESULT  CBG monitoring, ED     Status: Abnormal   Collection Time: 10/06/17  8:21 AM  Result Value Ref Range   Glucose-Capillary 102 (H) 65 - 99 mg/dL  CBG monitoring, ED     Status: None   Collection Time: 10/06/17 11:43 AM  Result Value Ref Range   Glucose-Capillary 92 65 - 99 mg/dL  Glucose, capillary     Status: None   Collection Time: 10/06/17  9:43 PM  Result Value Ref Range   Glucose-Capillary 91 65 - 99 mg/dL  Glucose, capillary     Status: None   Collection Time: 10/07/17  7:40 AM  Result Value Ref Range   Glucose-Capillary 82 65 - 99 mg/dL  Glucose, capillary     Status: None   Collection Time: 10/07/17  1:33 PM  Result Value Ref Range   Glucose-Capillary 86 65 - 99 mg/dL    Dg Abdomen Acute W/chest  Result Date: 10/06/2017 CLINICAL DATA:  72 year old female with shortness of breath and vomiting. EXAM: DG ABDOMEN ACUTE W/ 1V CHEST COMPARISON:  Chest radiograph dated 04/20/2017 FINDINGS: There is stable cardiomegaly with vascular congestion. Left lung base densities, likely atelectatic changes. A small left pleural effusion is less likely but not entirely excluded. No pneumothorax. There is atherosclerotic calcification of the aortic arch. The aorta is mildly tortuous. Left pectoral pacemaker device noted. Evaluation of the bowel is limited due to patient's body habitus. No definite bowel dilatation. Vascular calcification noted in the upper abdomen. There is vascular calcification versus stent in the distal aorta and common iliac arteries. Left femoral intramedullary rod. IMPRESSION: 1. Cardiomegaly with mild vascular calcification. 2. No focal consolidation. Minimal left lung base atelectasis. Trace left pleural effusion is less likely. 3. No bowel dilatation.  Electronically Signed   By: Anner Crete M.D.   On: 10/06/2017 00:23   Ir Paracentesis  Result Date: 10/06/2017 INDICATION: Patient with abdominal distention, ascites. Request is made for therapeutic paracentesis. Patient will be set as a 6 L limit as this is her first paracentesis. EXAM: ULTRASOUND GUIDED THERAPEUTIC PARACENTESIS MEDICATIONS: 10 mL 2% lidocaine COMPLICATIONS: None immediate. PROCEDURE: Informed written consent was obtained from the patient after a discussion of the risks, benefits and alternatives to treatment. A timeout was performed prior to the initiation of  the procedure. Initial ultrasound scanning demonstrates a large amount of ascites within the right lateral abdomen. The right lateral abdomen was prepped and draped in the usual sterile fashion. 2% lidocaine was used for local anesthesia. Following this, a 19 gauge, 7-cm, Yueh catheter was introduced. An ultrasound image was saved for documentation purposes. The paracentesis was performed. The catheter was removed and a dressing was applied. The patient tolerated the procedure well without immediate post procedural complication. FINDINGS: A total of approximately 6.0 L of yellow fluid was removed. IMPRESSION: Successful ultrasound-guided therapeutic paracentesis yielding 6.0 liters of peritoneal fluid. Read by: Brynda Greathouse PA-C Electronically Signed   By: Jacqulynn Cadet M.D.   On: 10/06/2017 14:49    ROS Blood pressure 97/78, pulse (!) 53, temperature 98 F (36.7 C), temperature source Oral, resp. rate 16, height _0  (1.702 m), weight 88.4 kg (194 lb 14.2 oz), SpO2 94 %. Physical Exam Physical Examination: General appearance - chronically ill appearing Mental status - alert, oriented to person, place, and time Eyes - pupils equal and reactive, extraocular eye movements intact, funduscopic exam normal, discs flat and sharp Mouth - edentulous Neck - adenopathy noted PCL Lymphatics - posterior cervical nodes Chest -  rales noted bibasilar, decreased air entry noted bilat Heart - S1 and S2 normal, systolic murmur MG5/0 at apex , honking, Gr 2/6 SEM ULSB Abdomen - soft, pos bs, liver down 5 cm Back exam - kyphosis Extremities - arthritic changes in feet, plethoric feet Skin - dry, evidence scratching  Assessment/Plan: 1 NONADHERENCE  Uremic ascites, vol xs. Needs recurrent HD and to dialyze as prescribed.  Not sure exactly why had paracenteses other than diagnostic as will make HD rougher.   2 ESRD: needs long, reg HD 3 Hypertension: not an issue 4. Anemia of ESRD:  Will use esa 5. Metabolic Bone Disease: needs to take binders,  6 DM 7 Pacer 8 NaSH P HD, lower vol, counseled. Educate MD staff.  Lindsey Sutton Rashanna Christiana 10/07/2017, 1:58 PM

## 2017-10-07 NOTE — Progress Notes (Signed)
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 Patient still makes urine. Patient was accompanied by family who reported that she had been very poorly adherent with her dialysis schedule for the past month or more, usually only going once a week or so, and often leaving early.  Patient reports the insidious development of dyspnea, nausea, and has had some nonbloody emesis.  Denies chest pain or palpitations and denies fevers or chills.  No significant abdominal pain, though abdomen has become increasingly distended. Patient underwent paracentesis on 10/06/17 and 6 Liters of fluid was removed.  Patient seen today. Repeat paracentesis done today and 10L of ascitic fluid removed. Patient is cachectic. Will proceed with CT of abdomen and pelvis without contrast (as patient still makes urine). Will also consult Nephrology. Patient is awake and alert, no asterixis, potassium is 3.6 and the CO2 is 23. No leg edema. Patient is cachectic. Will repeat ECHO based on findings of last ECHO done in May, 2018. Previous abdominal ultrasound done in May, 2018 revealed enlarged liver with increased echogenicity. Continue work up for massive ascites.  Assessment & Plan:   Principal Problem:   Acute respiratory distress   Massive ascites   Cachexia   Low albumin Active Problems:   Iron deficiency anemia   A-fib (HCC)   ESRD (end stage renal disease) (HCC)   Ascites   DM  (diabetes mellitus), type 2 with renal complications (HCC)   Essential hypertension   NAFLD (nonalcoholic fatty liver disease)   Thrombocytopenia (HCC)   Repeat paracentesis done today. Follow ascitic fluid analysis Start antibiotics CT Abdomen and pelvis without contrast (patient still makes urine) Consulted Nephrology. Optimize BP and Blood sugar Monitor platelets  Guarded prognosis  DVT prophylaxis: [place] Heparin Code Status: DNR Family Communication:  Disposition Plan: Depends on hospital course   Consultants:   IR  Nephrology  Procedures:   Paracentesis on 10/06/17  Repeat paracentesis on 10/07/17  Antimicrobials:   Start IV Cipro    Subjective: Abdominal distention is improving following paracentesis. SOB and nausea have resolved with paracentesis. No fever or chills No abdominal pain.  Objective: Vitals:   10/07/17 1230 10/07/17 1233 10/07/17 1236 10/07/17 1240  BP: (!) 118/58 107/61 100/60 97/78  Pulse:      Resp:      Temp:      TempSrc:      SpO2:      Weight:      Height:        Intake/Output Summary (Last 24 hours) at 10/07/2017 1603 Last data filed at 10/07/2017 1400 Gross per 24 hour  Intake 640 ml  Output 100 ml  Net 540 ml   Filed Weights   10/06/17 1609 10/07/17 0522  Weight: 88.6 kg (195 lb 5.2 oz) 88.4 kg (194 lb 14.2 oz)    Examination:  General exam: Quite cheerful, and friendly today following repeat paracentesis. AAO X 3. No asterixis. Cachectic.  Respiratory system: Clear to auscultation. Cardiovascular system: S1 & S2. NO LEG EDEMA.. Gastrointestinal system: Abdomen is distended with  shifting dullness. Organs are difficult to assess.Very distended. Dull to percussion. Organs are difficult to assess.  Central nervous system: Alert and oriented. No focal neurological deficits. Extremities: No leg edema  Data Reviewed: I have personally reviewed following labs and imaging studies  CBC: Recent Labs  Lab 10/05/17 1947  10/06/17 0245  WBC 4.2 5.1  HGB 11.3* 10.7*  HCT 36.4 35.0*  MCV 91.5 91.9  PLT 109* 114*   Basic Metabolic Panel: Recent Labs  Lab 10/05/17 1947 10/06/17 0245  NA 134* 134*  K 3.7 3.6  CL 92* 92*  CO2 23 23  GLUCOSE 136* 104*  BUN 36* 36*  CREATININE 7.54* 7.90*  CALCIUM 8.4* 8.4*   GFR: Estimated Creatinine Clearance: 7.5 mL/min (A) (by C-G formula based on SCr of 7.9 mg/dL (H)). Liver Function Tests: Recent Labs  Lab 10/05/17 1947 10/06/17 0245  AST 13* 13*  ALT 6* 6*  ALKPHOS 66 54  BILITOT 1.6* 1.4*  PROT 6.5 6.1*  ALBUMIN 3.0* 2.9*   Recent Labs  Lab 10/05/17 1947  LIPASE 27   No results for input(s): AMMONIA in the last 168 hours. Coagulation Profile: No results for input(s): INR, PROTIME in the last 168 hours. Cardiac Enzymes: No results for input(s): CKTOTAL, CKMB, CKMBINDEX, TROPONINI in the last 168 hours. BNP (last 3 results) No results for input(s): PROBNP in the last 8760 hours. HbA1C: No results for input(s): HGBA1C in the last 72 hours. CBG: Recent Labs  Lab 10/06/17 0821 10/06/17 1143 10/06/17 2143 10/07/17 0740 10/07/17 1333  GLUCAP 102* 92 91 82 86   Lipid Profile: No results for input(s): CHOL, HDL, LDLCALC, TRIG, CHOLHDL, LDLDIRECT in the last 72 hours. Thyroid Function Tests: No results for input(s): TSH, T4TOTAL, FREET4, T3FREE, THYROIDAB in the last 72 hours. Anemia Panel: No results for input(s): VITAMINB12, FOLATE, FERRITIN, TIBC, IRON, RETICCTPCT in the last 72 hours. Urine analysis:    Component Value Date/Time   COLORURINE AMBER (A) 01/25/2017 2240   APPEARANCEUR CLOUDY (A) 01/25/2017 2240   LABSPEC 1.014 01/25/2017 2240   PHURINE 6.0 01/25/2017 2240   GLUCOSEU NEGATIVE 01/25/2017 2240   HGBUR MODERATE (A) 01/25/2017 2240   BILIRUBINUR NEGATIVE 01/25/2017 2240   KETONESUR 5 (A) 01/25/2017 2240   PROTEINUR 100 (A) 01/25/2017 2240   NITRITE NEGATIVE 01/25/2017 2240   LEUKOCYTESUR SMALL (A) 01/25/2017 2240    Sepsis Labs: @LABRCNTIP (procalcitonin:4,lacticidven:4)  ) Recent Results (from the past 240 hour(s))  Body fluid culture     Status: None (Preliminary result)   Collection Time: 10/05/17 11:04 PM  Result Value Ref Range Status   Specimen Description ABDOMEN PERITONEAL  Final   Special Requests NONE  Final   Gram Stain   Final    FEW WBC PRESENT, PREDOMINANTLY MONONUCLEAR NO ORGANISMS SEEN    Culture NO GROWTH 1 DAY  Final   Report Status PENDING  Incomplete         Radiology Studies: Dg Abdomen Acute W/chest  Result Date: 10/06/2017 CLINICAL DATA:  72 year old female with shortness of breath and vomiting. EXAM: DG ABDOMEN ACUTE W/ 1V CHEST COMPARISON:  Chest radiograph dated 04/20/2017 FINDINGS: There is stable cardiomegaly with vascular congestion. Left lung base densities, likely atelectatic changes. A small left pleural effusion is less likely but not entirely excluded. No pneumothorax. There is atherosclerotic calcification of the aortic arch. The aorta is mildly tortuous. Left pectoral pacemaker device noted. Evaluation of the bowel is limited due to patient's body habitus. No definite bowel dilatation. Vascular calcification  noted in the upper abdomen. There is vascular calcification versus stent in the distal aorta and common iliac arteries. Left femoral intramedullary rod. IMPRESSION: 1. Cardiomegaly with mild vascular calcification. 2. No focal consolidation. Minimal left lung base atelectasis. Trace left pleural effusion is less likely. 3. No bowel dilatation. Electronically Signed   By: Elgie Collard M.D.   On: 10/06/2017 00:23   Ir Paracentesis  Result Date: 10/06/2017 INDICATION: Patient with abdominal distention, ascites. Request is made for therapeutic paracentesis. Patient will be set as a 6 L limit as this is her first paracentesis. EXAM: ULTRASOUND GUIDED THERAPEUTIC PARACENTESIS MEDICATIONS: 10 mL 2% lidocaine COMPLICATIONS: None immediate. PROCEDURE: Informed  written consent was obtained from the patient after a discussion of the risks, benefits and alternatives to treatment. A timeout was performed prior to the initiation of the procedure. Initial ultrasound scanning demonstrates a large amount of ascites within the right lateral abdomen. The right lateral abdomen was prepped and draped in the usual sterile fashion. 2% lidocaine was used for local anesthesia. Following this, a 19 gauge, 7-cm, Yueh catheter was introduced. An ultrasound image was saved for documentation purposes. The paracentesis was performed. The catheter was removed and a dressing was applied. The patient tolerated the procedure well without immediate post procedural complication. FINDINGS: A total of approximately 6.0 L of yellow fluid was removed. IMPRESSION: Successful ultrasound-guided therapeutic paracentesis yielding 6.0 liters of peritoneal fluid. Read by: Loyce Dys PA-C Electronically Signed   By: Malachy Moan M.D.   On: 10/06/2017 14:49        Scheduled Meds: . aspirin EC  81 mg Oral Daily  . cinacalcet  30 mg Oral Q supper  . [START ON 10/14/2017] darbepoetin (ARANESP) injection - DIALYSIS  100 mcg Intravenous Q Sat-HD  . [START ON 10/10/2017] doxercalciferol  4 mcg Intravenous Q T,Th,Sa-HD  . escitalopram  10 mg Oral Daily  . feeding supplement (NEPRO CARB STEADY)  237 mL Oral Q1500  . heparin  5,000 Units Subcutaneous Q8H  . insulin aspart  0-5 Units Subcutaneous QHS  . insulin aspart  0-9 Units Subcutaneous TID WC  . iopamidol      . lidocaine (PF)      . mometasone-formoterol  2 puff Inhalation BID  . multivitamin  1 tablet Oral QHS  . polyethylene glycol  17 g Oral Daily  . pregabalin  75 mg Oral BID  . senna-docusate  1 tablet Oral BID  . sevelamer carbonate  3,200 mg Oral TID WC  . sodium chloride flush  3 mL Intravenous Q12H  . sodium chloride flush  3 mL Intravenous Q12H   Continuous Infusions: . sodium chloride    . ciprofloxacin Stopped  (10/06/17 2114)     LOS: 1 day    Time spent: 40 Mins    Berton Mount, MD  [facility] Pager #: [phone] 7PM-7AM contact night coverage as above

## 2017-10-08 LAB — CBC WITH DIFFERENTIAL/PLATELET
Basophils Absolute: 0 10*3/uL (ref 0.0–0.1)
Basophils Relative: 0 %
Eosinophils Absolute: 0.3 10*3/uL (ref 0.0–0.7)
Eosinophils Relative: 4 %
HCT: 26.8 % — ABNORMAL LOW (ref 36.0–46.0)
Hemoglobin: 8.4 g/dL — ABNORMAL LOW (ref 12.0–15.0)
Lymphocytes Relative: 52 %
Lymphs Abs: 3.6 10*3/uL (ref 0.7–4.0)
MCH: 30.2 pg (ref 26.0–34.0)
MCHC: 31.3 g/dL (ref 30.0–36.0)
MCV: 96.4 fL (ref 78.0–100.0)
Monocytes Absolute: 0.7 10*3/uL (ref 0.1–1.0)
Monocytes Relative: 10 %
Neutro Abs: 2.4 10*3/uL (ref 1.7–7.7)
Neutrophils Relative %: 34 %
Platelets: 232 10*3/uL (ref 150–400)
RBC: 2.78 MIL/uL — ABNORMAL LOW (ref 3.87–5.11)
RDW: 14.1 % (ref 11.5–15.5)
WBC: 3.9 10*3/uL — ABNORMAL LOW (ref 4.0–10.5)

## 2017-10-08 LAB — RENAL FUNCTION PANEL
ANION GAP: 16 — AB (ref 5–15)
Albumin: 2.1 g/dL — ABNORMAL LOW (ref 3.5–5.0)
BUN: 56 mg/dL — AB (ref 6–20)
CALCIUM: 6.8 mg/dL — AB (ref 8.9–10.3)
CO2: 21 mmol/L — AB (ref 22–32)
CREATININE: 8.86 mg/dL — AB (ref 0.44–1.00)
Chloride: 90 mmol/L — ABNORMAL LOW (ref 101–111)
GFR calc Af Amer: 5 mL/min — ABNORMAL LOW (ref >60)
GFR calc non Af Amer: 4 mL/min — ABNORMAL LOW (ref >60)
GLUCOSE: 121 mg/dL — AB (ref 65–99)
Phosphorus: 7.5 mg/dL — ABNORMAL HIGH (ref 2.5–4.6)
Potassium: 4.1 mmol/L (ref 3.5–5.1)
SODIUM: 127 mmol/L — AB (ref 135–145)

## 2017-10-08 LAB — COMPREHENSIVE METABOLIC PANEL
ALT: 20 U/L (ref 14–54)
AST: 69 U/L — ABNORMAL HIGH (ref 15–41)
Albumin: 2.2 g/dL — ABNORMAL LOW (ref 3.5–5.0)
Alkaline Phosphatase: 246 U/L — ABNORMAL HIGH (ref 38–126)
Anion gap: 17 — ABNORMAL HIGH (ref 5–15)
BUN: 51 mg/dL — ABNORMAL HIGH (ref 6–20)
CO2: 24 mmol/L (ref 22–32)
Calcium: 7.2 mg/dL — ABNORMAL LOW (ref 8.9–10.3)
Chloride: 90 mmol/L — ABNORMAL LOW (ref 101–111)
Creatinine, Ser: 8.91 mg/dL — ABNORMAL HIGH (ref 0.44–1.00)
GFR calc Af Amer: 5 mL/min — ABNORMAL LOW (ref >60)
GFR calc non Af Amer: 4 mL/min — ABNORMAL LOW (ref >60)
Glucose, Bld: 104 mg/dL — ABNORMAL HIGH (ref 65–99)
Potassium: 3.9 mmol/L (ref 3.5–5.1)
Sodium: 131 mmol/L — ABNORMAL LOW (ref 135–145)
Total Bilirubin: 2.7 mg/dL — ABNORMAL HIGH (ref 0.3–1.2)
Total Protein: 4.9 g/dL — ABNORMAL LOW (ref 6.5–8.1)

## 2017-10-08 LAB — GLUCOSE, CAPILLARY
GLUCOSE-CAPILLARY: 87 mg/dL (ref 65–99)
GLUCOSE-CAPILLARY: 118 mg/dL — AB (ref 65–99)
GLUCOSE-CAPILLARY: 130 mg/dL — AB (ref 65–99)
GLUCOSE-CAPILLARY: 101 mg/dL — AB (ref 65–99)
GLUCOSE-CAPILLARY: 98 mg/dL (ref 65–99)

## 2017-10-08 LAB — CBC
HCT: 33.5 % — ABNORMAL LOW (ref 36.0–46.0)
HCT: 32.4 % — ABNORMAL LOW (ref 36.0–46.0)
HEMOGLOBIN: 10.1 g/dL — AB (ref 12.0–15.0)
HEMOGLOBIN: 10.1 g/dL — AB (ref 12.0–15.0)
MCH: 27.5 pg (ref 26.0–34.0)
MCH: 28.2 pg (ref 26.0–34.0)
MCHC: 30.1 g/dL (ref 30.0–36.0)
MCHC: 31.2 g/dL (ref 30.0–36.0)
MCV: 91.3 fL (ref 78.0–100.0)
MCV: 90.5 fL (ref 78.0–100.0)
Platelets: 94 10*3/uL — ABNORMAL LOW (ref 150–400)
Platelets: 87 10*3/uL — ABNORMAL LOW (ref 150–400)
RBC: 3.67 MIL/uL — ABNORMAL LOW (ref 3.87–5.11)
RBC: 3.58 MIL/uL — ABNORMAL LOW (ref 3.87–5.11)
RDW: 16.3 % — AB (ref 11.5–15.5)
RDW: 16.2 % — AB (ref 11.5–15.5)
WBC: 3.9 10*3/uL — ABNORMAL LOW (ref 4.0–10.5)
WBC: 3.1 10*3/uL — ABNORMAL LOW (ref 4.0–10.5)

## 2017-10-08 LAB — IRON AND TIBC
IRON: 50 ug/dL (ref 28–170)
Saturation Ratios: 48 % — ABNORMAL HIGH (ref 10.4–31.8)
TIBC: 105 ug/dL — AB (ref 250–450)
UIBC: 55 ug/dL

## 2017-10-08 LAB — ALBUMIN: Albumin: 2.2 g/dL — ABNORMAL LOW (ref 3.5–5.0)

## 2017-10-08 MED ORDER — SODIUM CHLORIDE 0.9 % IV SOLN: 100.0000 mL | INTRAVENOUS | Status: DC | PRN

## 2017-10-08 MED ORDER — HEPARIN SODIUM (PORCINE) 1000 UNIT/ML DIALYSIS
1000.0000 [IU] | INTRAMUSCULAR | Status: DC | PRN
  Filled 2017-10-08: qty 1

## 2017-10-08 MED ORDER — ACETAMINOPHEN 325 MG PO TABS
ORAL_TABLET | ORAL | Status: AC
  Administered 2017-10-08: 650 mg via ORAL
  Filled 2017-10-08: qty 2

## 2017-10-08 MED ORDER — ALTEPLASE 2 MG IJ SOLR: 2.0000 mg | Freq: Once | INTRAMUSCULAR | Status: DC | PRN

## 2017-10-08 MED ORDER — HEPARIN SODIUM (PORCINE) 1000 UNIT/ML DIALYSIS
100.0000 [IU]/kg | INTRAMUSCULAR | Status: DC | PRN
  Filled 2017-10-08: qty 9

## 2017-10-08 MED ORDER — LIDOCAINE-PRILOCAINE 2.5-2.5 % EX CREA
1.0000 "application " | TOPICAL_CREAM | CUTANEOUS | Status: DC | PRN
  Filled 2017-10-08: qty 5

## 2017-10-08 MED ORDER — PENTAFLUOROPROP-TETRAFLUOROETH EX AERO: 1.0000 "application " | INHALATION_SPRAY | CUTANEOUS | Status: DC | PRN

## 2017-10-08 MED ORDER — LIDOCAINE HCL (PF) 1 % IJ SOLN: 5.0000 mL | INTRAMUSCULAR | Status: DC | PRN

## 2017-10-08 NOTE — Plan of Care (Signed)
Pt. States she's feeling better and able to participate in ADLs.

## 2017-10-08 NOTE — Progress Notes (Signed)
Subjective: Interval History: has no complaint.  Objective: Vital signs in last 24 hours: Temp:  [98.4 F (36.9 C)-99.2 F (37.3 C)] 99.2 F (37.3 C) (01/20 0644) Pulse Rate:  [51-60] 60 (01/20 0731) Resp:  [16-18] 18 (01/20 0731) BP: (92-118)/(53-78) 102/62 (01/20 0644) SpO2:  [94 %-98 %] 96 % (01/20 0731) Weight:  [78.8 kg (173 lb 11.6 oz)] 78.8 kg (173 lb 11.6 oz) (01/20 0644) Weight change: -9.8 kg (-9.7 oz)  Intake/Output from previous day: 01/19 0701 - 01/20 0700 In: 1660 [P.O.:1560; IV Piggyback:100] Out: 100 [Urine:100] Intake/Output this shift: No intake/output data recorded.  General appearance: alert, cooperative, moderately obese and sallow complexion Resp: rales LLL Cardio: S1, S2 normal and systolic murmur: holosystolic 2/6, honking and 2nd M at ULSB at apex GI: Pos FW, nontender Extremities: AVF   Lab Results: Recent Labs    10/05/17 1947 10/06/17 0245  WBC 4.2 5.1  HGB 11.3* 10.7*  HCT 36.4 35.0*  PLT 109* 114*   BMET:  Recent Labs    10/05/17 1947 10/06/17 0245  NA 134* 134*  K 3.7 3.6  CL 92* 92*  CO2 23 23  GLUCOSE 136* 104*  BUN 36* 36*  CREATININE 7.54* 7.90*  CALCIUM 8.4* 8.4*   No results for input(s): PTH in the last 72 hours. Iron Studies: No results for input(s): IRON, TIBC, TRANSFERRIN, FERRITIN in the last 72 hours.  Studies/Results: Ct Abdomen Pelvis Wo Contrast  Result Date: 10/07/2017 CLINICAL DATA:  Abdominal distention. EXAM: CT ABDOMEN AND PELVIS WITHOUT CONTRAST TECHNIQUE: Multidetector CT imaging of the abdomen and pelvis was performed following the standard protocol without IV contrast. COMPARISON:  Paracentesis earlier today. FINDINGS: Lower chest: Pacer leads seen into the right ventricular apex. Atherosclerotic calcification of the right coronary and aorta. Small pleural effusions and mild atelectasis. Hepatobiliary: Borderline lobulated liver surface.Cholelithiasis. No indication of acute cholecystitis. Pancreas:  Generalized atrophy.  No acute finding. Spleen: Unremarkable. Adrenals/Urinary Tract: 2 cm left adrenal mass with -14 Hounsfield unit densitometry. 2.5 cm right adrenal mass with 30 Hounsfield unit densitometry. No reported history of malignancy. Marked renal atrophy. Dilated left renal pelvis which was also seen on sonography 01/20/2017 where it appeared simple and fluid intensity. Negative urinary bladder. Stomach/Bowel:  No obstruction or inflammatory changes. Vascular/Lymphatic: Confluent arterial calcification. No acute vascular finding. No mass or adenopathy. Reproductive:No pathologic findings. Other: Moderate to large ascites without loculation or definite nodularity. No hemoperitoneum after paracentesis today. Anasarca. Musculoskeletal: No acute finding. Remote appearing L4 transverse process fracture on the left. Remote left proximal femur fracture and repair. IMPRESSION: 1. Moderate to large ascites.  Small pleural effusions. 2. 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 3. Small pleural effusions. 4. Cholelithiasis. Electronically Signed   By: Marnee SpringJonathon  Watts M.D.   On: 10/07/2017 20:26   [place] Paracentesis  Result Date: 10/07/2017 INDICATION: Patient wtih abdominal distention, ascites. Request is made for therapeutic paracentesis. EXAM: ULTRASOUND GUIDED THERAPEUTIC PARACENTESIS MEDICATIONS: 10 mL 1% lidocaine COMPLICATIONS: None immediate. PROCEDURE: Informed written consent was obtained from the patient after a discussion of the risks, benefits and alternatives to treatment. A timeout was performed prior to the initiation of the procedure. Initial ultrasound scanning demonstrates a large amount of ascites within the right lateral abdomen. The right lateral abdomen was prepped and draped in the usual sterile fashion. 1% lidocaine was used for local anesthesia. Following this, a 19 gauge, 7-cm, Yueh catheter was introduced. An ultrasound image was  saved  for documentation purposes. The paracentesis was performed. The catheter was removed and a dressing was applied. The patient tolerated the procedure well without immediate post procedural complication. FINDINGS: A total of approximately 10.0 liters of clear, yellow fluid was removed. IMPRESSION: Successful ultrasound-guided therapeutic paracentesis yielding 10.0 liters of peritoneal fluid. Read by: Loyce Dys PA-C Electronically Signed   By: Richarda Overlie M.D.   On: 10/07/2017 13:29   Ir Paracentesis  Result Date: 10/06/2017 INDICATION: Patient with abdominal distention, ascites. Request is made for therapeutic paracentesis. Patient will be set as a 6 L limit as this is her first paracentesis. EXAM: ULTRASOUND GUIDED THERAPEUTIC PARACENTESIS MEDICATIONS: 10 mL 2% lidocaine COMPLICATIONS: None immediate. PROCEDURE: Informed written consent was obtained from the patient after a discussion of the risks, benefits and alternatives to treatment. A timeout was performed prior to the initiation of the procedure. Initial ultrasound scanning demonstrates a large amount of ascites within the right lateral abdomen. The right lateral abdomen was prepped and draped in the usual sterile fashion. 2% lidocaine was used for local anesthesia. Following this, a 19 gauge, 7-cm, Yueh catheter was introduced. An ultrasound image was saved for documentation purposes. The paracentesis was performed. The catheter was removed and a dressing was applied. The patient tolerated the procedure well without immediate post procedural complication. FINDINGS: A total of approximately 6.0 L of yellow fluid was removed. IMPRESSION: Successful ultrasound-guided therapeutic paracentesis yielding 6.0 liters of peritoneal fluid. Read by: Loyce Dys PA-C Electronically Signed   By: Malachy Moan M.D.   On: 10/06/2017 14:49    I have reviewed the patient's current medications.  Assessment/Plan: 1 ESRD did not get HD yest due to back  up.  Vol xs Uremic, uremic ascites 2 Uremic ascites 3 Anemia esa 4 HPTH vit D 5 NONADHERENCE primary issue 6 DM P HD esa, counseled    LOS: 2 days   Fayrene Fearing Mikale Silversmith 10/08/2017,9:00 AM

## 2017-10-08 NOTE — Plan of Care (Signed)
  Education: Knowledge of General Education information will improve 10/08/2017 0306 - Progressing by Elnita Maxwellodoo, Rontavious Albright A, RN   Clinical Measurements: Ability to maintain clinical measurements within normal limits will improve 10/08/2017 0306 - Progressing by Elnita Maxwellodoo, Yoshie Kosel A, RN   Clinical Measurements: Diagnostic test results will improve 10/08/2017 0306 - Progressing by Elnita Maxwellodoo, Maudell Stanbrough A, RN   Clinical Measurements: Respiratory complications will improve 10/08/2017 0306 - Progressing by Elnita Maxwellodoo, Memory Heinrichs A, RN   Activity: Risk for activity intolerance will decrease 10/08/2017 0306 - Progressing by Elnita Maxwellodoo, Loella Hickle A, RN

## 2017-10-08 NOTE — Progress Notes (Deleted)
Subjective: Interval History: has complaints arms sore but willing to move them.  Objective: Vital signs in last 24 hours: Temp:  [98.4 F (36.9 C)-99.2 F (37.3 C)] 99.2 F (37.3 C) (01/20 0644) Pulse Rate:  [51-60] 60 (01/20 0731) Resp:  [16-18] 18 (01/20 0731) BP: (92-118)/(53-78) 102/62 (01/20 0644) SpO2:  [94 %-98 %] 96 % (01/20 0731) Weight:  [78.8 kg (173 lb 11.6 oz)] 78.8 kg (173 lb 11.6 oz) (01/20 0644) Weight change: -9.8 kg (-9.7 oz)  Intake/Output from previous day: 01/19 0701 - 01/20 0700 In: 1660 [P.O.:1560; IV Piggyback:100] Out: 100 [Urine:100] Intake/Output this shift: No intake/output data recorded.  General appearance: alert, cooperative, no distress and pale Resp: clear to auscultation bilaterally Chest wall: RIJ PC Cardio: S1, S2 normal and systolic murmur: holosystolic 2/6, blowing at apex GI: soft, nontender, liver down 4 cm Extremities: AVF LUA B&T, less swelling  Lab Results: Recent Labs    10/05/17 1947 10/06/17 0245  WBC 4.2 5.1  HGB 11.3* 10.7*  HCT 36.4 35.0*  PLT 109* 114*   BMET:  Recent Labs    10/05/17 1947 10/06/17 0245  NA 134* 134*  K 3.7 3.6  CL 92* 92*  CO2 23 23  GLUCOSE 136* 104*  BUN 36* 36*  CREATININE 7.54* 7.90*  CALCIUM 8.4* 8.4*   No results for input(s): PTH in the last 72 hours. Iron Studies: No results for input(s): IRON, TIBC, TRANSFERRIN, FERRITIN in the last 72 hours.  Studies/Results: Ct Abdomen Pelvis Wo Contrast  Result Date: 10/07/2017 CLINICAL DATA:  Abdominal distention. EXAM: CT ABDOMEN AND PELVIS WITHOUT CONTRAST TECHNIQUE: Multidetector CT imaging of the abdomen and pelvis was performed following the standard protocol without IV contrast. COMPARISON:  Paracentesis earlier today. FINDINGS: Lower chest: Pacer leads seen into the right ventricular apex. Atherosclerotic calcification of the right coronary and aorta. Small pleural effusions and mild atelectasis. Hepatobiliary: Borderline lobulated  liver surface.Cholelithiasis. No indication of acute cholecystitis. Pancreas: Generalized atrophy.  No acute finding. Spleen: Unremarkable. Adrenals/Urinary Tract: 2 cm left adrenal mass with -14 Hounsfield unit densitometry. 2.5 cm right adrenal mass with 30 Hounsfield unit densitometry. No reported history of malignancy. Marked renal atrophy. Dilated left renal pelvis which was also seen on sonography 01/20/2017 where it appeared simple and fluid intensity. Negative urinary bladder. Stomach/Bowel:  No obstruction or inflammatory changes. Vascular/Lymphatic: Confluent arterial calcification. No acute vascular finding. No mass or adenopathy. Reproductive:No pathologic findings. Other: Moderate to large ascites without loculation or definite nodularity. No hemoperitoneum after paracentesis today. Anasarca. Musculoskeletal: No acute finding. Remote appearing L4 transverse process fracture on the left. Remote left proximal femur fracture and repair. IMPRESSION: 1. Moderate to large ascites.  Small pleural effusions. 2. 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 3. Small pleural effusions. 4. Cholelithiasis. Electronically Signed   By: Marnee Spring M.D.   On: 10/07/2017 20:26   US Paracentesis  Result Date: 10/07/2017 INDICATION: Patient wtih abdominal distention, ascites. Request is made for therapeutic paracentesis. EXAM: ULTRASOUND GUIDED THERAPEUTIC PARACENTESIS MEDICATIONS: 10 mL 1% lidocaine COMPLICATIONS: None immediate. PROCEDURE: Informed written consent was obtained from the patient after a discussion of the risks, benefits and alternatives to treatment. A timeout was performed prior to the initiation of the procedure. Initial ultrasound scanning demonstrates a large amount of ascites within the right lateral abdomen. The right lateral abdomen was prepped and draped in the usual sterile fashion. 1% lidocaine was used for local anesthesia. Following  this, a  19 gauge, 7-cm, Yueh catheter was introduced. An ultrasound image was saved for documentation purposes. The paracentesis was performed. The catheter was removed and a dressing was applied. The patient tolerated the procedure well without immediate post procedural complication. FINDINGS: A total of approximately 10.0 liters of clear, yellow fluid was removed. IMPRESSION: Successful ultrasound-guided therapeutic paracentesis yielding 10.0 liters of peritoneal fluid. Read by: Loyce DysKacie Matthews PA-C Electronically Signed   By: Richarda OverlieAdam  Henn M.D.   On: 10/07/2017 13:29   Ir Paracentesis  Result Date: 10/06/2017 INDICATION: Patient with abdominal distention, ascites. Request is made for therapeutic paracentesis. Patient will be set as a 6 L limit as this is her first paracentesis. EXAM: ULTRASOUND GUIDED THERAPEUTIC PARACENTESIS MEDICATIONS: 10 mL 2% lidocaine COMPLICATIONS: None immediate. PROCEDURE: Informed written consent was obtained from the patient after a discussion of the risks, benefits and alternatives to treatment. A timeout was performed prior to the initiation of the procedure. Initial ultrasound scanning demonstrates a large amount of ascites within the right lateral abdomen. The right lateral abdomen was prepped and draped in the usual sterile fashion. 2% lidocaine was used for local anesthesia. Following this, a 19 gauge, 7-cm, Yueh catheter was introduced. An ultrasound image was saved for documentation purposes. The paracentesis was performed. The catheter was removed and a dressing was applied. The patient tolerated the procedure well without immediate post procedural complication. FINDINGS: A total of approximately 6.0 L of yellow fluid was removed. IMPRESSION: Successful ultrasound-guided therapeutic paracentesis yielding 6.0 liters of peritoneal fluid. Read by: Loyce DysKacie Matthews PA-C Electronically Signed   By: Malachy MoanHeath  McCullough M.D.   On: 10/06/2017 14:49    I have reviewed the patient's  current medications.  Assessment/Plan: 1 ESRD HD TTS 2 HTN better with vol lower 3 Anemia stable on esa 4 HPTH vit D 5 Substance abuse 6 DVT?? tx x 6 wk. 7 Cardiac arrest from not dialyzing P HD TTS, cont esa, vit D, mobilize, NHP    LOS: 2 days   Lindsey Sutton 10/08/2017,8:55 AM

## 2017-10-08 NOTE — Progress Notes (Signed)
Patient arrived to unit per bed.  Reviewed treatment plan and this RN agrees.  Report received from bedside RN, Clydie BraunKaren.  Consent obtained.  Patient A & O X 4. Lung sounds diminished to ausculation in all fields. No edema. Cardiac: Afib.  Prepped LLAVF with alcohol and cannulated with two 15 gauge needles.  Pulsation of blood noted.  Flushed access well with saline per protocol.  Connected and secured lines and initiated tx at 2145.  UF goal of 3500 mL and net fluid removal of 3000 mL.  Will continue to monitor.

## 2017-10-08 NOTE — Progress Notes (Signed)
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 Patient still makes urine. Patient was accompanied by family who reported that she had been very poorly adherent with her dialysis schedule for the past month or more, usually only going once a week or so, and often leaving early.  Patient reports the insidious development of dyspnea, nausea, and has had some nonbloody emesis.  Denies chest pain or palpitations and denies fevers or chills.  No significant abdominal pain, though abdomen has become increasingly distended. Patient underwent paracentesis on 10/06/17 and 6 Liters of fluid was removed.  10/07/18 - Patient seen today. Repeat paracentesis done today and 10L of ascitic fluid removed. Patient is cachectic. Will proceed with CT of abdomen and pelvis without contrast (as patient still makes urine). Will also consult Nephrology. Patient is awake and alert, no asterixis, potassium is 3.6 and the CO2 is 23. No leg edema. Patient is cachectic. Will repeat ECHO based on findings of last ECHO done in May, 2018. Previous abdominal ultrasound done in May, 2018 revealed enlarged liver with increased echogenicity. Continue work up for massive ascites.  10/08/17 - Patient seen. Remains stable. Nephrology input is appreciated. CT Scan of abdomen and pelvis revealed bilateral adrenal mass (Left measuring 2cm and right measuring 2.5cm). Adrenal mass will need further work up.  Will add cytology to ascitic fluid analysis that is in process. Likely repeat Paracentesis in am. No new complaints. No fever or chills. No SOB.  Assessment & Plan:   Principal Problem:   Acute respiratory distress   Massive ascites   Cachexia   Low albumin   Bilateral adrenal mass Active Problems:   Iron deficiency anemia   A-fib (HCC)   ESRD (end stage renal disease) (HCC)   Ascites   DM (diabetes mellitus), type 2 with renal complications (HCC)   Essential hypertension   NAFLD (nonalcoholic fatty liver disease)   Thrombocytopenia (HCC)   Repeat paracentesis in am possibly. Follow ascitic fluid analysis Add cytology to ascitic fluid analysis Continue Cipro until culture of the fluid is finalized CT Abdomen and pelvis without contrast (patient still makes urine) result noted Nephrology input is appreciated Optimize BP and Blood sugar Monitor platelets  Guarded prognosis  DVT prophylaxis: [place] Heparin Code Status: DNR Family Communication:  Disposition Plan: Depends on hospital course   Consultants:   IR  Nephrology  Procedures:   Paracentesis on 10/06/17  Repeat paracentesis on 10/07/17  Antimicrobials:   Start IV Cipro    Subjective: Abdominal distention is stable. SOB and nausea have resolved with paracentesis. No fever or chills No abdominal pain.  Objective: Vitals:   10/07/17 2122 10/08/17 0644 10/08/17 0731 10/08/17 1145  BP: 103/74 102/62  123/62  Pulse: (!) 58 (!) 51 60 61  Resp: 18 18 18 18   Temp: 98.4 F (36.9 C) 99.2 F (37.3 C)  98.9 F (37.2 C)  TempSrc: Oral Oral  Oral  SpO2: 95% 98% 96% 95%  Weight:  78.8 kg (173 lb 11.6 oz)    Height:  Intake/Output Summary (Last 24 hours) at 10/08/2017 1158 Last data filed at 10/08/2017 0942 Gross per 24 hour  Intake 1580 ml  Output 101 ml  Net 1479 ml   Filed Weights   10/06/17 1609 10/07/17 0522 10/08/17 0644  Weight: 88.6 kg (195 lb 5.2 oz) 88.4 kg (194 lb 14.2 oz) 78.8 kg  (173 lb 11.6 oz)    Examination:  General exam: Quite cheerful, and friendly today following repeat paracentesis. AAO X 3. No asterixis. Cachectic.  Respiratory system: Decreased air entry posteriorly. Cardiovascular system: S1 & S2. NO LEG EDEMA.. Gastrointestinal system: Abdomen is distended with shifting dullness. Organs are difficult to assess.Very distended. Dull to percussion. Organs are difficult to assess.  Central nervous system: Alert and oriented. No focal neurological deficits. Extremities: No leg edema  Data Reviewed: I have personally reviewed following labs and imaging studies  CBC: Recent Labs  Lab 10/05/17 1947 10/06/17 0245 10/08/17 0711 10/08/17 0841  WBC 4.2 5.1 3.9* 3.9*  NEUTROABS  --   --   --  2.4  HGB 11.3* 10.7* 10.1* 8.4*  HCT 36.4 35.0* 33.5* 26.8*  MCV 91.5 91.9 91.3 96.4  PLT 109* 114* 94* 232   Basic Metabolic Panel: Recent Labs  Lab 10/05/17 1947 10/06/17 0245 10/08/17 0711  NA 134* 134* 131*  K 3.7 3.6 3.9  CL 92* 92* 90*  CO2 23 23 24   GLUCOSE 136* 104* 104*  BUN 36* 36* 51*  CREATININE 7.54* 7.90* 8.91*  CALCIUM 8.4* 8.4* 7.2*   GFR: Estimated Creatinine Clearance: 6.3 mL/min (A) (by C-G formula based on SCr of 8.91 mg/dL (H)). Liver Function Tests: Recent Labs  Lab 10/05/17 1947 10/06/17 0245 10/08/17 0711  AST 13* 13* 69*  ALT 6* 6* 20  ALKPHOS 66 54 246*  BILITOT 1.6* 1.4* 2.7*  PROT 6.5 6.1* 4.9*  ALBUMIN 3.0* 2.9* 2.2*   Recent Labs  Lab 10/05/17 1947  LIPASE 27   No results for input(s): AMMONIA in the last 168 hours. Coagulation Profile: No results for input(s): INR, PROTIME in the last 168 hours. Cardiac Enzymes: No results for input(s): CKTOTAL, CKMB, CKMBINDEX, TROPONINI in the last 168 hours. BNP (last 3 results) No results for input(s): PROBNP in the last 8760 hours. HbA1C: No results for input(s): HGBA1C in the last 72 hours. CBG: Recent Labs  Lab 10/07/17 1333 10/07/17 1639 10/07/17 2101  10/08/17 0727 10/08/17 1143  GLUCAP 86 103* 105* 118* 130*   Lipid Profile: No results for input(s): CHOL, HDL, LDLCALC, TRIG, CHOLHDL, LDLDIRECT in the last 72 hours. Thyroid Function Tests: No results for input(s): TSH, T4TOTAL, FREET4, T3FREE, THYROIDAB in the last 72 hours. Anemia Panel: Recent Labs    10/08/17 0711  TIBC 105*  IRON 50   Urine analysis:    Component Value Date/Time   COLORURINE AMBER (A) 01/25/2017 2240   APPEARANCEUR CLOUDY (A) 01/25/2017 2240   LABSPEC 1.014 01/25/2017 2240   PHURINE 6.0 01/25/2017 2240   GLUCOSEU NEGATIVE 01/25/2017 2240   HGBUR MODERATE (A) 01/25/2017 2240   BILIRUBINUR NEGATIVE 01/25/2017 2240   KETONESUR 5 (A) 01/25/2017 2240   PROTEINUR 100 (A) 01/25/2017 2240   NITRITE NEGATIVE 01/25/2017 2240   LEUKOCYTESUR SMALL (A) 01/25/2017 2240   Sepsis Labs: @LABRCNTIP (procalcitonin:4,lacticidven:4)  ) Recent Results (from the past 240 hour(s))  Body fluid culture     Status: None (Preliminary result)   Collection Time: 10/05/17 11:04 PM  Result Value Ref Range Status   Specimen Description ABDOMEN PERITONEAL  Final  Special Requests NONE  Final   Gram Stain   Final    FEW WBC PRESENT, PREDOMINANTLY MONONUCLEAR NO ORGANISMS SEEN    Culture NO GROWTH 2 DAYS  Final   Report Status PENDING  Incomplete         Radiology Studies: Ct Abdomen Pelvis Wo Contrast  Result Date: 10/07/2017 CLINICAL DATA:  Abdominal distention. EXAM: CT ABDOMEN AND PELVIS WITHOUT CONTRAST TECHNIQUE: Multidetector CT imaging of the abdomen and pelvis was performed following the standard protocol without IV contrast. COMPARISON:  Paracentesis earlier today. FINDINGS: Lower chest: Pacer leads seen into the right ventricular apex. Atherosclerotic calcification of the right coronary and aorta. Small pleural effusions and mild atelectasis. Hepatobiliary: Borderline lobulated liver surface.Cholelithiasis. No indication of acute cholecystitis. Pancreas:  Generalized atrophy.  No acute finding. Spleen: Unremarkable. Adrenals/Urinary Tract: 2 cm left adrenal mass with -14 Hounsfield unit densitometry. 2.5 cm right adrenal mass with 30 Hounsfield unit densitometry. No reported history of malignancy. Marked renal atrophy. Dilated left renal pelvis which was also seen on sonography 01/20/2017 where it appeared simple and fluid intensity. Negative urinary bladder. Stomach/Bowel:  No obstruction or inflammatory changes. Vascular/Lymphatic: Confluent arterial calcification. No acute vascular finding. No mass or adenopathy. Reproductive:No pathologic findings. Other: Moderate to large ascites without loculation or definite nodularity. No hemoperitoneum after paracentesis today. Anasarca. Musculoskeletal: No acute finding. Remote appearing L4 transverse process fracture on the left. Remote left proximal femur fracture and repair. IMPRESSION: 1. Moderate to large ascites.  Small pleural effusions. 2. 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 3. Small pleural effusions. 4. Cholelithiasis. Electronically Signed   By: Marnee SpringJonathon  Watts M.D.   On: 10/07/2017 20:26   [place] Paracentesis  Result Date: 10/07/2017 INDICATION: Patient wtih abdominal distention, ascites. Request is made for therapeutic paracentesis. EXAM: ULTRASOUND GUIDED THERAPEUTIC PARACENTESIS MEDICATIONS: 10 mL 1% lidocaine COMPLICATIONS: None immediate. PROCEDURE: Informed written consent was obtained from the patient after a discussion of the risks, benefits and alternatives to treatment. A timeout was performed prior to the initiation of the procedure. Initial ultrasound scanning demonstrates a large amount of ascites within the right lateral abdomen. The right lateral abdomen was prepped and draped in the usual sterile fashion. 1% lidocaine was used for local anesthesia. Following this, a 19 gauge, 7-cm, Yueh catheter was introduced. An ultrasound image was  saved for documentation purposes. The paracentesis was performed. The catheter was removed and a dressing was applied. The patient tolerated the procedure well without immediate post procedural complication. FINDINGS: A total of approximately 10.0 liters of clear, yellow fluid was removed. IMPRESSION: Successful ultrasound-guided therapeutic paracentesis yielding 10.0 liters of peritoneal fluid. Read by: Loyce DysKacie Matthews PA-C Electronically Signed   By: Richarda OverlieAdam  Henn M.D.   On: 10/07/2017 13:29   [place] Paracentesis  Result Date: 10/06/2017 INDICATION: Patient with abdominal distention, ascites. Request is made for therapeutic paracentesis. Patient will be set as a 6 L limit as this is her first paracentesis. EXAM: ULTRASOUND GUIDED THERAPEUTIC PARACENTESIS MEDICATIONS: 10 mL 2% lidocaine COMPLICATIONS: None immediate. PROCEDURE: Informed written consent was obtained from the patient after a discussion of the risks, benefits and alternatives to treatment. A timeout was performed prior to the initiation of the procedure. Initial ultrasound scanning demonstrates a large amount of ascites within the right lateral abdomen. The right lateral abdomen was prepped and draped in the usual sterile fashion. 2% lidocaine was used for local anesthesia. Following this, a 19 gauge, 7-cm, Yueh catheter was  introduced. An ultrasound image was saved for documentation purposes. The paracentesis was performed. The catheter was removed and a dressing was applied. The patient tolerated the procedure well without immediate post procedural complication. FINDINGS: A total of approximately 6.0 L of yellow fluid was removed. IMPRESSION: Successful ultrasound-guided therapeutic paracentesis yielding 6.0 liters of peritoneal fluid. Read by: Loyce Dys PA-C Electronically Signed   By: Malachy Moan M.D.   On: 10/06/2017 14:49        Scheduled Meds: . aspirin EC  81 mg Oral Daily  . cinacalcet  30 mg Oral Q supper  . [START ON  10/14/2017] darbepoetin (ARANESP) injection - DIALYSIS  100 mcg Intravenous Q Sat-HD  . [START ON 10/10/2017] doxercalciferol  4 mcg Intravenous Q T,Th,Sa-HD  . escitalopram  10 mg Oral Daily  . feeding supplement (NEPRO CARB STEADY)  237 mL Oral Q1500  . heparin  5,000 Units Subcutaneous Q8H  . insulin aspart  0-5 Units Subcutaneous QHS  . insulin aspart  0-9 Units Subcutaneous TID WC  . mometasone-formoterol  2 puff Inhalation BID  . multivitamin  1 tablet Oral QHS  . polyethylene glycol  17 g Oral Daily  . pregabalin  75 mg Oral BID  . senna-docusate  1 tablet Oral BID  . sevelamer carbonate  3,200 mg Oral TID WC  . sodium chloride flush  3 mL Intravenous Q12H  . sodium chloride flush  3 mL Intravenous Q12H   Continuous Infusions: . sodium chloride    . ciprofloxacin Stopped (10/07/17 1744)     LOS: 2 days    Time spent: 40 Mins    Berton Mount, MD  [facility] Pager #: [phone] 7PM-7AM contact night coverage as above

## 2017-10-08 NOTE — Progress Notes (Signed)
HD attempted report at 2055.  Bedside RN not yet assessed pt, will return call.

## 2017-10-09 ENCOUNTER — Inpatient Hospital Stay (HOSPITAL_COMMUNITY): Payer: Medicare Other

## 2017-10-09 DIAGNOSIS — I361 Nonrheumatic tricuspid (valve) insufficiency: Secondary | ICD-10-CM

## 2017-10-09 LAB — GLUCOSE, CAPILLARY
GLUCOSE-CAPILLARY: 117 mg/dL — AB (ref 65–99)
Glucose-Capillary: 90 mg/dL (ref 65–99)
Glucose-Capillary: 81 mg/dL (ref 65–99)
Glucose-Capillary: 113 mg/dL — ABNORMAL HIGH (ref 65–99)

## 2017-10-09 LAB — PATHOLOGIST SMEAR REVIEW

## 2017-10-09 LAB — RENAL FUNCTION PANEL
Albumin: 2.1 g/dL — ABNORMAL LOW (ref 3.5–5.0)
Anion gap: 13 (ref 5–15)
BUN: 26 mg/dL — ABNORMAL HIGH (ref 6–20)
CO2: 25 mmol/L (ref 22–32)
Calcium: 7 mg/dL — ABNORMAL LOW (ref 8.9–10.3)
Chloride: 92 mmol/L — ABNORMAL LOW (ref 101–111)
Creatinine, Ser: 5.49 mg/dL — ABNORMAL HIGH (ref 0.44–1.00)
GFR calc Af Amer: 8 mL/min — ABNORMAL LOW (ref >60)
GFR calc non Af Amer: 7 mL/min — ABNORMAL LOW (ref >60)
Glucose, Bld: 84 mg/dL (ref 65–99)
Phosphorus: 4.4 mg/dL (ref 2.5–4.6)
Potassium: 3.4 mmol/L — ABNORMAL LOW (ref 3.5–5.1)
Sodium: 130 mmol/L — ABNORMAL LOW (ref 135–145)

## 2017-10-09 LAB — CBC WITH DIFFERENTIAL/PLATELET
Basophils Absolute: 0 10*3/uL (ref 0.0–0.1)
Basophils Relative: 1 %
Eosinophils Absolute: 0.1 10*3/uL (ref 0.0–0.7)
Eosinophils Relative: 4 %
HCT: 31.3 % — ABNORMAL LOW (ref 36.0–46.0)
Hemoglobin: 9.5 g/dL — ABNORMAL LOW (ref 12.0–15.0)
Lymphocytes Relative: 26 %
Lymphs Abs: 0.8 10*3/uL (ref 0.7–4.0)
MCH: 27.8 pg (ref 26.0–34.0)
MCHC: 30.4 g/dL (ref 30.0–36.0)
MCV: 91.5 fL (ref 78.0–100.0)
Monocytes Absolute: 0.3 10*3/uL (ref 0.1–1.0)
Monocytes Relative: 9 %
Neutro Abs: 1.8 10*3/uL (ref 1.7–7.7)
Neutrophils Relative %: 60 %
Platelets: 85 10*3/uL — ABNORMAL LOW (ref 150–400)
RBC: 3.42 MIL/uL — ABNORMAL LOW (ref 3.87–5.11)
RDW: 16.2 % — ABNORMAL HIGH (ref 11.5–15.5)
WBC: 2.9 10*3/uL — ABNORMAL LOW (ref 4.0–10.5)

## 2017-10-09 LAB — ECHOCARDIOGRAM COMPLETE
Height: 67 in
Weight: 2768.98 oz

## 2017-10-09 LAB — BODY FLUID CULTURE: Culture: NO GROWTH

## 2017-10-09 MED ORDER — OXYCODONE HCL 5 MG PO TABS
ORAL_TABLET | ORAL | Status: AC
  Administered 2017-10-09: 5 mg via ORAL
  Filled 2017-10-09: qty 1

## 2017-10-09 NOTE — Progress Notes (Signed)
Pt. High fall risk. Refuses bed alarm. Pt. Educated about fall safety. Stated she will call for assistance.

## 2017-10-09 NOTE — Progress Notes (Signed)
  Echocardiogram 2D Echocardiogram has been performed.  Leta JunglingCooper, Latecia Miler M 10/09/2017, 4:20 PM

## 2017-10-09 NOTE — Progress Notes (Signed)
Subjective: Interval History: has no complaint.  Objective: Vital signs in last 24 hours: Temp:  [97.9 F (36.6 C)-98.3 F (36.8 C)] 97.9 F (36.6 C) (01/21 0215) Pulse Rate:  [51-70] 56 (01/21 1227) Resp:  [16-18] 18 (01/21 1227) BP: (76-126)/(40-68) 88/53 (01/21 1227) SpO2:  [96 %-100 %] 98 % (01/21 1227) Weight:  [78.5 kg (173 lb 1 oz)-82.6 kg (182 lb 1.6 oz)] 78.5 kg (173 lb 1 oz) (01/21 0249) Weight change: 3.8 kg (8 lb 6 oz)  Intake/Output from previous day: 01/20 0701 - 01/21 0700 In: 860 [P.O.:860] Out: 2151 [Urine:150; Stool:1] Intake/Output this shift: No intake/output data recorded.  General appearance: alert, cooperative, moderately obese and sallow complexion Resp: rales LLL Cardio: S1, S2 normal and systolic murmur: holosystolic 2/6, honking and 2nd M at ULSB at apex GI: Pos FW, nontender Extremities: AVF   Lab Results: Recent Labs    10/08/17 2145 10/09/17 0430  WBC 3.1* 2.9*  HGB 10.1* 9.5*  HCT 32.4* 31.3*  PLT 87* 85*   BMET:  Recent Labs    10/08/17 2144 10/09/17 0945  NA 127* 130*  K 4.1 3.4*  CL 90* 92*  CO2 21* 25  GLUCOSE 121* 84  BUN 56* 26*  CREATININE 8.86* 5.49*  CALCIUM 6.8* 7.0*   No results for input(s): PTH in the last 72 hours. Iron Studies:  Recent Labs    10/08/17 0711  IRON 50  TIBC 105*    Studies/Results: Ct Abdomen Pelvis Wo Contrast  Result Date: 10/07/2017 CLINICAL DATA:  Abdominal distention. EXAM: CT ABDOMEN AND PELVIS WITHOUT CONTRAST TECHNIQUE: Multidetector CT imaging of the abdomen and pelvis was performed following the standard protocol without IV contrast. COMPARISON:  Paracentesis earlier today. FINDINGS: Lower chest: Pacer leads seen into the right ventricular apex. Atherosclerotic calcification of the right coronary and aorta. Small pleural effusions and mild atelectasis. Hepatobiliary: Borderline lobulated liver surface.Cholelithiasis. No indication of acute cholecystitis. Pancreas: Generalized  atrophy.  No acute finding. Spleen: Unremarkable. Adrenals/Urinary Tract: 2 cm left adrenal mass with -14 Hounsfield unit densitometry. 2.5 cm right adrenal mass with 30 Hounsfield unit densitometry. No reported history of malignancy. Marked renal atrophy. Dilated left renal pelvis which was also seen on sonography 01/20/2017 where it appeared simple and fluid intensity. Negative urinary bladder. Stomach/Bowel:  No obstruction or inflammatory changes. Vascular/Lymphatic: Confluent arterial calcification. No acute vascular finding. No mass or adenopathy. Reproductive:No pathologic findings. Other: Moderate to large ascites without loculation or definite nodularity. No hemoperitoneum after paracentesis today. Anasarca. Musculoskeletal: No acute finding. Remote appearing L4 transverse process fracture on the left. Remote left proximal femur fracture and repair. IMPRESSION: 1. Moderate to large ascites.  Small pleural effusions. 2. 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 3. Small pleural effusions. 4. Cholelithiasis. Electronically Signed   By: Marnee SpringJonathon  Watts M.D.   On: 10/07/2017 20:26    I have reviewed the patient's current medications.  Dialyzes [facility] TTS since 8/18 4h   95kg (?)  2/2.25 bath  Hep 4000  LUA AVF   Assessment: 1 ESRD uremic, vol excess from missed HD. Feels better after hd this am 2 Uremic ascites 3 Anemia esa 4 HPTH vit D 5 NONADHERENCE primary issue 6 DM  P HD tomorrow, UF as tol   LOS: 3 days   Maree Krabbeobert D Annalucia Laino 10/09/2017,1:59 PM

## 2017-10-09 NOTE — Progress Notes (Signed)
Dialysis treatment completed.  2600 mL ultrafiltrated and net fluid removal 2000 mL.    Patient status unchanged. Lung sounds diminished to ausculation in all fields. Generalized edema. Cardiac: NSR.  Disconnected lines and removed needles.  Pressure held for 10 minutes and band aid/gauze dressing applied.  Report given to bedside RN, Clydie BraunKaren.

## 2017-10-09 NOTE — Progress Notes (Signed)
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 Patient still makes urine. Patient was accompanied by family who reported that she had been very poorly adherent with her dialysis schedule for the past month or more, usually only going once a week or so, and often leaving early.  Patient reports the insidious development of dyspnea, nausea, and has had some nonbloody emesis.  Denies chest pain or palpitations and denies fevers or chills.  No significant abdominal pain, though abdomen has become increasingly distended. Patient underwent paracentesis on 10/06/17 and 6 Liters of fluid was removed. Repeat paracentesis was done on 10/07/2017 and 10L of ascitic fluid was removed.   10/09/17 - Patient underwent first dialysis early this morning. Discussed with CT Abd/pelvis finding with Radiology. Also discussed renal issues with Nephrology. Patient is not keen on being discharged back home. Patient still has significant ascitic fluid but BP is low normal following HD. Further paracentesis can be done as outpatient if ascites does not resolve with HD. There will be need to repeat CT Abdomen and pelvis in the next 6-12 Months considering findings of bilateral adrenal mass. Will also check INR in am. Patient may also have liver cirrhosis secondary to NASH, and this may be contributing significantly to the ascitic fluid. Likely GI follow up on discharge. Patient has been non  compliant.    Assessment & Plan:   Principal Problem:   Acute respiratory distress   Massive ascites   Cachexia   Low albumin   Bilateral adrenal mass Active Problems:   Iron deficiency anemia   A-fib (HCC)   ESRD (end stage renal disease) (HCC)   Ascites   DM (diabetes mellitus), type 2 with renal complications (HCC)   Essential hypertension   NAFLD (nonalcoholic fatty liver disease)   Thrombocytopenia (HCC)   Follow ascitic fluid analysis Follow ascitic fluid cytology. DC Cipro CT Abdomen and pelvis without contrast (patient still makes urine) result noted Nephrology input is appreciated Optimize BP and Blood sugar Monitor platelets INR in am  Guarded prognosis  DVT prophylaxis: [place] Heparin Code Status: DNR Family Communication:  Disposition Plan: Depends on hospital course   Consultants:   IR  Nephrology  Procedures:   Paracentesis on 10/06/17  Repeat paracentesis on 10/07/17  Antimicrobials:   Start IV Cipro    Subjective: Abdominal distention is stable. SOB and nausea have resolved with paracentesis. No fever or chills No abdominal pain.  Objective: Vitals:   10/09/17 0249 10/09/17 0832 10/09/17 0842 10/09/17 1227  BP:   98/64 (!) 88/53  Pulse:   (!) 55 (!) 56  Resp:    18  Temp:      TempSrc:      SpO2:  96%  98%  Weight: 78.5 kg (173 lb 1 oz)     Height:        Intake/Output Summary (Last 24 hours) at 10/09/2017 1856 Last data filed at 10/09/2017 1841 Gross per 24 hour  Intake 840 ml  Output 2000 ml  Net -1160 ml   [employer]  10/08/17 2130 10/09/17 0215 10/09/17 0249  Weight: 82.6 kg (182 lb 1.6 oz) 80.6 kg (177 lb 11.1 oz) 78.5 kg (173 lb 1 oz)    Examination:  General exam: Quite cheerful, and friendly today following repeat paracentesis. AAO X 3. No asterixis. Cachectic.  Respiratory system: Decreased air entry posteriorly. Cardiovascular system: S1 & S2. NO LEG EDEMA.. Gastrointestinal system: Abdomen is distended  with shifting dullness. Organs are difficult to assess.Very distended. Dull to percussion. Organs are difficult to assess.  Central nervous system: Alert and oriented. No focal neurological deficits. Extremities: No leg edema  Data Reviewed: I have personally reviewed following labs and imaging studies  CBC: Recent Labs  Lab 10/06/17 0245 10/08/17 0711 10/08/17 0841 10/08/17 2145 10/09/17 0430  WBC 5.1 3.9* 3.9* 3.1* 2.9*  NEUTROABS  --   --  2.4  --  1.8  HGB 10.7* 10.1* 8.4* 10.1* 9.5*  HCT 35.0* 33.5* 26.8* 32.4* 31.3*  MCV 91.9 91.3 96.4 90.5 91.5  PLT 114* 94* 232 87* 85*   Basic Metabolic Panel: Recent Labs  Lab 10/05/17 1947 10/06/17 0245 10/08/17 0711 10/08/17 2144 10/09/17 0945  NA 134* 134* 131* 127* 130*  K 3.7 3.6 3.9 4.1 3.4*  CL 92* 92* 90* 90* 92*  CO2 23 23 24  21* 25  GLUCOSE 136* 104* 104* 121* 84  BUN 36* 36* 51* 56* 26*  CREATININE 7.54* 7.90* 8.91* 8.86* 5.49*  CALCIUM 8.4* 8.4* 7.2* 6.8* 7.0*  PHOS  --   --   --  7.5* 4.4   GFR: Estimated Creatinine Clearance: 10.1 mL/min (A) (by C-G formula based on SCr of 5.49 mg/dL (H)). Liver Function Tests: Recent Labs  Lab 10/05/17 1947 10/06/17 0245 10/08/17 0711 10/08/17 0841 10/08/17 2144 10/09/17 0945  AST 13* 13* 69*  --   --   --   ALT 6* 6* 20  --   --   --   ALKPHOS 66 54 246*  --   --   --   BILITOT 1.6* 1.4* 2.7*  --   --   --   PROT 6.5 6.1* 4.9*  --   --   --   ALBUMIN 3.0* 2.9* 2.2* 2.2* 2.1* 2.1*   Recent Labs  Lab 10/05/17 1947  LIPASE 27   No results for input(s): AMMONIA in the last 168 hours. Coagulation Profile: No results for input(s): INR, PROTIME in the last 168 hours. Cardiac Enzymes: No results for input(s): CKTOTAL, CKMB, CKMBINDEX, TROPONINI in the last 168 hours. BNP (last 3 results) No results for input(s): PROBNP in the last 8760 hours. HbA1C: No results for input(s): HGBA1C in the last 72 hours. CBG: Recent Labs  Lab 10/08/17 1623 10/08/17 2113  10/09/17 0725 10/09/17 1117 10/09/17 1619  GLUCAP 101* 98 90 81 117*   Lipid Profile: No results for input(s): CHOL, HDL, LDLCALC, TRIG, CHOLHDL, LDLDIRECT in the last 72 hours. Thyroid Function Tests: No results for input(s): TSH, T4TOTAL, FREET4, T3FREE, THYROIDAB in the last 72 hours. Anemia Panel: Recent Labs    10/08/17 0711  TIBC 105*  IRON 50   Urine analysis:    Component Value Date/Time   COLORURINE AMBER (A) 01/25/2017 2240   APPEARANCEUR CLOUDY (A) 01/25/2017 2240   LABSPEC 1.014 01/25/2017 2240   PHURINE 6.0 01/25/2017 2240   GLUCOSEU NEGATIVE 01/25/2017 2240   HGBUR MODERATE (A) 01/25/2017 2240   BILIRUBINUR NEGATIVE 01/25/2017 2240   KETONESUR 5 (A) 01/25/2017 2240   PROTEINUR 100 (A) 01/25/2017 2240  NITRITE NEGATIVE 01/25/2017 2240   LEUKOCYTESUR SMALL (A) 01/25/2017 2240   Sepsis Labs: @LABRCNTIP (procalcitonin:4,lacticidven:4)  ) Recent Results (from the past 240 hour(s))  Body fluid culture     Status: None   Collection Time: 10/05/17 11:04 PM  Result Value Ref Range Status   Specimen Description ABDOMEN PERITONEAL  Final   Special Requests NONE  Final   Gram Stain   Final    FEW WBC PRESENT, PREDOMINANTLY MONONUCLEAR NO ORGANISMS SEEN    Culture NO GROWTH 3 DAYS  Final   Report Status 10/09/2017 FINAL  Final         Radiology Studies: No results found.      Scheduled Meds: . aspirin EC  81 mg Oral Daily  . cinacalcet  30 mg Oral Q supper  . [START ON 10/14/2017] darbepoetin (ARANESP) injection - DIALYSIS  100 mcg Intravenous Q Sat-HD  . [START ON 10/10/2017] doxercalciferol  4 mcg Intravenous Q T,Th,Sa-HD  . escitalopram  10 mg Oral Daily  . feeding supplement (NEPRO CARB STEADY)  237 mL Oral Q1500  . heparin  5,000 Units Subcutaneous Q8H  . insulin aspart  0-5 Units Subcutaneous QHS  . insulin aspart  0-9 Units Subcutaneous TID WC  . mometasone-formoterol  2 puff Inhalation BID  . multivitamin  1 tablet Oral QHS  .  polyethylene glycol  17 g Oral Daily  . pregabalin  75 mg Oral BID  . senna-docusate  1 tablet Oral BID  . sevelamer carbonate  3,200 mg Oral TID WC  . sodium chloride flush  3 mL Intravenous Q12H  . sodium chloride flush  3 mL Intravenous Q12H   Continuous Infusions: . sodium chloride       LOS: 3 days    Time spent: 40 Mins    Berton Mount, MD  [facility] Pager #: [phone] 7PM-7AM contact night coverage as above

## 2017-10-09 NOTE — Progress Notes (Signed)
Pt refusing bed alarm  Pt educated on high fall risk. Pt still refuses, aware of risks including fall and injury  Will continue to monitor

## 2017-10-10 DIAGNOSIS — K760 Fatty (change of) liver, not elsewhere classified: Secondary | ICD-10-CM

## 2017-10-10 DIAGNOSIS — I350 Nonrheumatic aortic (valve) stenosis: Secondary | ICD-10-CM

## 2017-10-10 LAB — PROTIME-INR
INR: 1.28
Prothrombin Time: 15.9 seconds — ABNORMAL HIGH (ref 11.4–15.2)

## 2017-10-10 LAB — RENAL FUNCTION PANEL
ALBUMIN: 2 g/dL — AB (ref 3.5–5.0)
Anion gap: 16 — ABNORMAL HIGH (ref 5–15)
BUN: 39 mg/dL — AB (ref 6–20)
CALCIUM: 6.9 mg/dL — AB (ref 8.9–10.3)
CO2: 20 mmol/L — AB (ref 22–32)
Chloride: 93 mmol/L — ABNORMAL LOW (ref 101–111)
Creatinine, Ser: 6.35 mg/dL — ABNORMAL HIGH (ref 0.44–1.00)
GFR calc Af Amer: 7 mL/min — ABNORMAL LOW (ref >60)
GFR calc non Af Amer: 6 mL/min — ABNORMAL LOW (ref >60)
GLUCOSE: 134 mg/dL — AB (ref 65–99)
PHOSPHORUS: 4.9 mg/dL — AB (ref 2.5–4.6)
Potassium: 3.1 mmol/L — ABNORMAL LOW (ref 3.5–5.1)
SODIUM: 129 mmol/L — AB (ref 135–145)

## 2017-10-10 LAB — CBC
HCT: 30.1 % — ABNORMAL LOW (ref 36.0–46.0)
HEMOGLOBIN: 9.3 g/dL — AB (ref 12.0–15.0)
MCH: 28.4 pg (ref 26.0–34.0)
MCHC: 30.9 g/dL (ref 30.0–36.0)
MCV: 92 fL (ref 78.0–100.0)
Platelets: 81 10*3/uL — ABNORMAL LOW (ref 150–400)
RBC: 3.27 MIL/uL — ABNORMAL LOW (ref 3.87–5.11)
RDW: 16.3 % — AB (ref 11.5–15.5)
WBC: 2.7 10*3/uL — ABNORMAL LOW (ref 4.0–10.5)

## 2017-10-10 LAB — GLUCOSE, CAPILLARY: GLUCOSE-CAPILLARY: 93 mg/dL (ref 65–99)

## 2017-10-10 MED ORDER — DOXERCALCIFEROL 4 MCG/2ML IV SOLN
INTRAVENOUS | Status: AC
  Filled 2017-10-10: qty 2

## 2017-10-10 MED ORDER — RENA-VITE PO TABS: 1.0000 | ORAL_TABLET | Freq: Every day | ORAL | 0 refills | Status: AC

## 2017-10-10 MED ORDER — ALBUMIN HUMAN 25 % IV SOLN
25.0000 g | Freq: Once | INTRAVENOUS | Status: AC
  Administered 2017-10-10: 25 g via INTRAVENOUS

## 2017-10-10 MED ORDER — ALBUMIN HUMAN 25 % IV SOLN
INTRAVENOUS | Status: AC
  Filled 2017-10-10: qty 100

## 2017-10-10 MED ORDER — SEVELAMER CARBONATE 800 MG PO TABS: 3200.0000 mg | ORAL_TABLET | Freq: Three times a day (TID) | ORAL | 0 refills | Status: AC

## 2017-10-10 MED ORDER — DOXERCALCIFEROL 4 MCG/2ML IV SOLN: 4.0000 ug | INTRAVENOUS | 0 refills | Status: AC

## 2017-10-10 MED ORDER — DARBEPOETIN ALFA 100 MCG/0.5ML IJ SOSY: 100.0000 ug | PREFILLED_SYRINGE | INTRAMUSCULAR | 0 refills | Status: AC

## 2017-10-10 NOTE — Plan of Care (Signed)
  Education: Knowledge of General Education information will improve 10/10/2017 0007 - Progressing by Elnita Maxwellodoo, Kerrie Timm A, RN   Health Behavior/Discharge Planning: Ability to manage health-related needs will improve 10/10/2017 0007 - Progressing by Elnita Maxwellodoo, Casten Floren A, RN   Clinical Measurements: Will remain free from infection 10/10/2017 0007 - Progressing by Elnita Maxwellodoo, Edder Bellanca A, RN   Clinical Measurements: Respiratory complications will improve 10/10/2017 0007 - Progressing by Elnita Maxwellodoo, Myrtie Leuthold A, RN

## 2017-10-10 NOTE — Consult Note (Signed)
Cardiology Consultation:   Patient ID: Lindsey Sutton; [id number]; 1945/10/17   Admit date: 10/05/2017 Date of Consult: 10/10/2017  Primary Care Provider: [name], NP Primary Cardiologist: New to Dr. Rennis Golden   Patient Profile:   Lindsey Sutton is a 72 y.o. 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 Dartha Lodge.   She moved from [place], [place] last year to liver with her daughter. Seems she was followed by [facility] there. However, pt is unsure. Denies prior hx of CAD or MI. However had cath 2-3 years ago.  She has long standing hx of atrial fibrillation and aortic stenosis.  The patient also has permanent pacemaker.  Patient states that it was done for dialysis and syncope.  Last seen by cardiologist 2 years ago.  History of Present Illness:   Lindsey Sutton was admitted 10/06/17 for shortness of breath, abdominal distention and nausea after several minutes dialysis sessions for past 1 month prior to presentation. Patient underwent paracentesis on 10/06/17 and 6 Liters of fluid was removed. Repeat paracentesis was done on 10/07/2017 and 10L of ascitic fluid was removed.  Patient underwent first dialysis yesterday.  CT of abdominal pelvis 10/07/17 IMPRESSION: 1. Moderate to large ascites.  Small pleural effusions. 2. 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 3. Small pleural effusions. 4. Cholelithiasis.  Cardiology is consulted for worsening aortic stenosis per echocardiogram as described below:  Study Conclusions  - Left ventricle: The cavity size was normal. Wall thickness was   increased in a pattern of severe LVH.  Systolic function was   normal. The estimated ejection fraction was in the range of 55%   to 60%. - Aortic valve: Gradients have progressed since echo 01/24/17 when   mean was 34 mmHg and peak 61 mmHg. There was severe stenosis.  Now Mean gradient (S): 41 mm Hg. Peak gradient (S): 70 mm Hg.   There was trivial regurgitation. Valve area (VTI): 0.73 cm^2.   Valve area (Vmax): 0.73 cm^2. Valve area (Vmean): 0.71 cm^2. - Mitral valve: Severely calcified annulus. Severely thickened,   severely calcified leaflets . There was mild regurgitation. Valve   area by continuity equation (using LVOT flow): 1.9 cm^2. - Left atrium: The atrium was moderately dilated. - Right ventricle: The cavity size was mildly dilated. Wall   thickness was normal. - Right atrium: The atrium was mildly dilated. - Atrial septum: No defect or patent foramen ovale was identified. - Pulmonary arteries: PA peak pressure: 40 mm Hg (S).  Limited ambulation.  He does stretching exercise at home.  She denies any chest pain, shortness of breath, orthopnea, PND, syncope, lower extremity edema or melena.  Past Medical History:  Diagnosis Date  . A-fib (HCC)   . Anxiety   . Arthritis    "legs" (04/20/2017)  . COPD (chronic obstructive pulmonary disease) (HCC)   . Depression   . ESRD (end stage renal disease) on dialysis (HCC)    "TTS; [facility]; [place]" (04/20/2017)  . Heart murmur   . History of blood transfusion    related to childbirth  . Iron deficiency anemia   . NAFLD (nonalcoholic fatty liver disease) 4/0/9811  .  Obesity (BMI 30-39.9) 01/20/2017  . Pacemaker   . Pulmonary edema   . Secondary hyperparathyroidism of renal origin (HCC) 01/22/2017  . Systolic heart failure (HCC)   . Thrombocytopenia (HCC) 01/21/2017  . Type II diabetes mellitus (HCC)     Past Surgical History:  Procedure Laterality Date  . AV FISTULA PLACEMENT Left ~ 2003  . AV FISTULA REPAIR Left X 2  . CARDIAC CATHETERIZATION    . CATARACT  EXTRACTION W/ INTRAOCULAR LENS  IMPLANT, BILATERAL Bilateral 2017  . FRACTURE SURGERY    . INSERT / REPLACE / REMOVE PACEMAKER  ~ 2012  . INTRAMEDULLARY (IM) NAIL INTERTROCHANTERIC Left 01/20/2017   Procedure: INTRAMEDULLARY (IM) NAIL INTERTROCHANTRIC;  Surgeon: Tarry Kos, MD;  Location: [facility] OR;  Service: Orthopedics;  Laterality: Left;  . IR PARACENTESIS  10/06/2017     Inpatient Medications: Scheduled Meds: . aspirin EC  81 mg Oral Daily  . cinacalcet  30 mg Oral Q supper  . [START ON 10/14/2017] darbepoetin (ARANESP) injection - DIALYSIS  100 mcg Intravenous Q Sat-HD  . doxercalciferol  4 mcg Intravenous Q T,Th,Sa-HD  . escitalopram  10 mg Oral Daily  . feeding supplement (NEPRO CARB STEADY)  237 mL Oral Q1500  . heparin  5,000 Units Subcutaneous Q8H  . insulin aspart  0-5 Units Subcutaneous QHS  . insulin aspart  0-9 Units Subcutaneous TID WC  . mometasone-formoterol  2 puff Inhalation BID  . multivitamin  1 tablet Oral QHS  . polyethylene glycol  17 g Oral Daily  . pregabalin  75 mg Oral BID  . senna-docusate  1 tablet Oral BID  . sevelamer carbonate  3,200 mg Oral TID WC  . sodium chloride flush  3 mL Intravenous Q12H  . sodium chloride flush  3 mL Intravenous Q12H   Continuous Infusions: . sodium chloride     PRN Meds: sodium chloride, acetaminophen **OR** acetaminophen, albuterol, ondansetron **OR** ondansetron (ZOFRAN) IV, oxyCODONE, sodium chloride flush  Allergies:    Allergies  Allergen Reactions  . Penicillins Swelling    Has patient had a PCN reaction causing immediate rash, facial/tongue/throat swelling, SOB or lightheadedness with hypotension: Yes Has patient had a PCN reaction causing severe rash involving mucus membranes or skin necrosis: No Has patient had a PCN reaction that required hospitalization No Has patient had a PCN reaction occurring within the last 10 years: No If all of the above answers are "NO", then may proceed with Cephalosporin use.      Social History:   Social History   Socioeconomic History  . Marital status: Widowed    Spouse name: Not on file  . Number of children: Not on file  . Years of education: Not on file  . Highest education level: Not on file  Social Needs  . Financial resource strain: Not on file  . Food insecurity - worry: Not on file  . Food insecurity - inability: Not on file  . Transportation needs - medical: Not on file  . Transportation needs - non-medical: Not on file  Occupational History  . Not on file  Tobacco Use  . Smoking status: Former Smoker    Packs/day: 1.00    Years: 33.00    Pack years: 33.00    Types: Cigarettes    Last attempt to quit: 09/23/1996    Years since quitting: 21.0  . Smokeless tobacco: Never Used  Substance and Sexual Activity  . Alcohol use: No    Comment: 04/20/2017 "nothing in the 2000s"  .  Drug use: No  . Sexual activity: No  Other Topics Concern  . Not on file  Social History Narrative  . Not on file    Family History:    Family History  Problem Relation Age of Onset  . Diabetes Mother   . Hypertension Father      ROS:  Please see the history of present illness.  ROS All other ROS reviewed and negative.     Physical Exam/Data:   Vitals:   10/10/17 1030 10/10/17 1100 10/10/17 1112 10/10/17 1257  BP: 115/76 (!) 100/50 (!) 103/52 (!) 106/46  Pulse: 71 63 61 (!) 52  Resp: 17 18 18 18   Temp:   97.7 F (36.5 C) 97.8 F (36.6 C)  TempSrc:   Oral Oral  SpO2:   95% 100%  Weight:   174 lb 13.2 oz (79.3 kg)   Height:        Intake/Output Summary (Last 24 hours) at 10/10/2017 1344 Last data filed at 10/10/2017 1258 Gross per 24 hour  Intake 1200 ml  Output 2800 ml  Net -1600 ml   Filed Weights   10/10/17 0429 10/10/17 0652 10/10/17 1112  Weight: 173 lb 4.8 oz (78.6 kg) 180 lb 16 oz (82.1 kg) 174 lb 13.2 oz (79.3 kg)   Body mass index is 27.38 kg/m.  General: Frail ill-appearing female in no acute distress HEENT: normal Lymph: no  adenopathy Neck: no JVD Endocrine:  No thryomegaly Vascular: No carotid bruits; FA pulses 2+ bilaterally without bruits  Cardiac:  normal S1, S2, irregularly irregular rhythm, 4/6systolic ejaculation murmur Lungs:  clear to auscultation bilaterally, no wheezing, rhonchi or rales  Abd: soft, nontender, distended  Ext: no edema Musculoskeletal:  No deformities, BUE and BLE strength normal and equal Skin: warm and dry  Neuro:  CNs 2-12 intact, no focal abnormalities noted Psych:  Normal affect   EKG:  The EKG was personally reviewed and demonstrates: Atrial fibrillation at rate of 80 bpm Telemetry:  Telemetry was personally reviewed and demonstrates: Atrial fibrillation with controlled ventricular rate  Relevant CV Studies: As summarized above  Laboratory Data:  Chemistry Recent Labs  Lab 10/08/17 2144 10/09/17 0945 10/10/17 0638  NA 127* 130* 129*  K 4.1 3.4* 3.1*  CL 90* 92* 93*  CO2 21* 25 20*  GLUCOSE 121* 84 134*  BUN 56* 26* 39*  CREATININE 8.86* 5.49* 6.35*  CALCIUM 6.8* 7.0* 6.9*  GFRNONAA 4* 7* 6*  GFRAA 5* 8* 7*  ANIONGAP 16* 13 16*    Recent Labs  Lab 10/05/17 1947 10/06/17 0245 10/08/17 0711  10/08/17 2144 10/09/17 0945 10/10/17 0638  PROT 6.5 6.1* 4.9*  --   --   --   --   ALBUMIN 3.0* 2.9* 2.2*   < > 2.1* 2.1* 2.0*  AST 13* 13* 69*  --   --   --   --   ALT 6* 6* 20  --   --   --   --   ALKPHOS 66 54 246*  --   --   --   --   BILITOT 1.6* 1.4* 2.7*  --   --   --   --    < > = values in this interval not displayed.   Hematology Recent Labs  Lab 10/08/17 2145 10/09/17 0430 10/10/17 0625  WBC 3.1* 2.9* 2.7*  RBC 3.58* 3.42* 3.27*  HGB 10.1* 9.5* 9.3*  HCT 32.4* 31.3* 30.1*  MCV 90.5 91.5 92.0  MCH 28.2 27.8  28.4  MCHC 31.2 30.4 30.9  RDW 16.2* 16.2* 16.3*  PLT 87* 85* 81*   Radiology/Studies:  Ct Abdomen Pelvis Wo Contrast  Result Date: 10/07/2017 CLINICAL DATA:  Abdominal distention. EXAM: CT ABDOMEN AND PELVIS WITHOUT CONTRAST  TECHNIQUE: Multidetector CT imaging of the abdomen and pelvis was performed following the standard protocol without IV contrast. COMPARISON:  Paracentesis earlier today. FINDINGS: Lower chest: Pacer leads seen into the right ventricular apex. Atherosclerotic calcification of the right coronary and aorta. Small pleural effusions and mild atelectasis. Hepatobiliary: Borderline lobulated liver surface.Cholelithiasis. No indication of acute cholecystitis. Pancreas: Generalized atrophy.  No acute finding. Spleen: Unremarkable. Adrenals/Urinary Tract: 2 cm left adrenal mass with -14 Hounsfield unit densitometry. 2.5 cm right adrenal mass with 30 Hounsfield unit densitometry. No reported history of malignancy. Marked renal atrophy. Dilated left renal pelvis which was also seen on sonography 01/20/2017 where it appeared simple and fluid intensity. Negative urinary bladder. Stomach/Bowel:  No obstruction or inflammatory changes. Vascular/Lymphatic: Confluent arterial calcification. No acute vascular finding. No mass or adenopathy. Reproductive:No pathologic findings. Other: Moderate to large ascites without loculation or definite nodularity. No hemoperitoneum after paracentesis today. Anasarca. Musculoskeletal: No acute finding. Remote appearing L4 transverse process fracture on the left. Remote left proximal femur fracture and repair. IMPRESSION: 1. Moderate to large ascites.  Small pleural effusions. 2. 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 3. Small pleural effusions. 4. Cholelithiasis. Electronically Signed   By: Marnee Spring M.D.   On: 10/07/2017 20:26   US Paracentesis  Result Date: 10/07/2017 INDICATION: Patient wtih abdominal distention, ascites. Request is made for therapeutic paracentesis. EXAM: ULTRASOUND GUIDED THERAPEUTIC PARACENTESIS MEDICATIONS: 10 mL 1% lidocaine COMPLICATIONS: None immediate. PROCEDURE: Informed written consent was obtained  from the patient after a discussion of the risks, benefits and alternatives to treatment. A timeout was performed prior to the initiation of the procedure. Initial ultrasound scanning demonstrates a large amount of ascites within the right lateral abdomen. The right lateral abdomen was prepped and draped in the usual sterile fashion. 1% lidocaine was used for local anesthesia. Following this, a 19 gauge, 7-cm, Yueh catheter was introduced. An ultrasound image was saved for documentation purposes. The paracentesis was performed. The catheter was removed and a dressing was applied. The patient tolerated the procedure well without immediate post procedural complication. FINDINGS: A total of approximately 10.0 liters of clear, yellow fluid was removed. IMPRESSION: Successful ultrasound-guided therapeutic paracentesis yielding 10.0 liters of peritoneal fluid. Read by: Loyce Dys PA-C Electronically Signed   By: Richarda Overlie M.D.   On: 10/07/2017 13:29    Assessment and Plan:   1. Severe aortic stenosis -Echo showed worsening of aortic stenosis. Now  Mean gradient (S): 41 mm Hg. Peak gradient (S): 70 mm Hg.  She denies any chest pain or shortness of breath. -She is not interested in further evaluation or valve replacement/repair.  2.  Persistent atrial fibrillation -Rate well controlled without any medication.  Not on anticoagulation due to fall risk per note. CHADSVASC score of 3 for (Age, sex and DM)  3.  Bilateral adrenal mass -Per primary team  4.  Ascites with hx of NAFLD - S/p paracentesis x 2. Per primary team  5.  History of permanent pacemaker placement -Obtain records.  Follow-up in A. fib clinic/EP afterwards.  For questions or updates, please contact [facility] Please consult [email or web address] for contact info under Cardiology/STEMI.   Lorelei Pont, [place]  10/10/2017 1:44 PM

## 2017-10-10 NOTE — Discharge Summary (Signed)
Physician Discharge Summary  Patient ID: Lindsey Sutton MRN: [id number] DOB/AGE: 71/10/1945 71 y.o.  Admit date: 10/05/2017 Discharge date: 10/10/2017  Admission Diagnoses:  Discharge Diagnoses:  Principal Problem:   Acute respiratory distress   Massive Ascites S/p Paracentesis on 2 occasions with total of 16L of ascitic fluid removed   Severe Aortic Stenosis   Bilateral Adrenal Mass (2cm on the left and 2.5cm on the right. Repeat scan in 6-12 Months)   Non compliance with Hemodialysis   Possible liver cirrhosis Active Problems:   Iron deficiency anemia   A-fib (HCC)   ESRD (end stage renal disease) (HCC)   Ascites   DM (diabetes mellitus), type 2 with renal complications (HCC)   Essential hypertension   NAFLD (nonalcoholic fatty liver disease)   Thrombocytopenia (HCC)   Discharged Condition: stable  Hospital Course:  Patient is a 72 year old Caucasianfemalewith past medical history significant forchronic pain, depression, nonalcoholic liver disease, moderate to severe aortic stenosis, severe pulmonary hypertension, 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 Patient presented to the emergency department with massive ascites and abdominal distention, shortness of breath, nausea, and malaise. Patient still makes urine. Patient was accompanied by family who reported that patient had been very poorly adherent with her dialysis schedule for the past month or more, usually only going once a week or so, and often leaving early. Patient reported insidious development of dyspnea, nausea, with some nonbloody emesis. Patient denied associated denied fever or chill. No significant abdominal pain, though abdomen has become increasingly distended. Patient was admitted for further assessment and management. The shortness of breath was thought to be secondary to massive ascites. Patient underwent paracentesis on  10/06/17 and 6 Liters of fluid was removed. Repeat paracentesis was done on 10/07/2017 and 10L of ascitic fluid was removed. Nephrology was consulted and the patient was eventually dialyzed on 10/09/17 and 10/10/17. Repeat ECHO revealed severe aortic stenosis. Patient has not been compliant with medical management, and has not been following up with Healthcare providers. Need to comply with treatment was discussed with the patient extensively. CT of the abdomen and pelvis without contrast done revealed bilateral adrenal masses, 2cm on the left and 2.5cm on the right. There will be need to repeat CT Abdomen and Pelvis in the next 6-12 months. Follow cytology result of the ascitic fluid as this is still pending. Patient still has moderate to large ascites despite having taking out 16 liters of ascitic fluid. PCP may arrange further thoracentesis on outpatient basis if the ascites does not resolve with Hemodialysis. The challenge is that patient blood pressure remains low during Hemodialysis and this limits ultrafiltration.   Overall, patient's prognosis is guarded.   Consults: cardiology and nephrology  Significant Diagnostic Studies: CT Abdomen and pelvis - Moderate to large ascites.  Small pleural effusions. 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 Small pleural effusions. Cholelithiasis.  Treatments: see above  Discharge Exam: Blood pressure (!) 106/46, pulse (!) 52, temperature 97.8 F (36.6 C), temperature source Oral, resp. rate 18, height 5\' 7"  (1.702 m), weight 79.3 kg (174 lb 13.2 oz), SpO2 100 %.   Disposition: 03-Skilled Nursing Facility  Discharge Instructions    Call MD for:   Complete by:  As directed    Call MD if symptoms worsen.   Diet - low sodium heart healthy   Complete by:  As directed    Cardiac/Renal  Diet   Discharge instructions   Complete by:  As directed    Please comply with outpatient Hemodialysis. Follow up  with PCP and Cardiology on discharge. Your PCP may need to arrange paracentesis in the future if ascites remains significant.   Increase activity slowly   Complete by:  As directed      Allergies as of 10/10/2017      Reactions   Penicillins Swelling   Has patient had a PCN reaction causing immediate rash, facial/tongue/throat swelling, SOB or lightheadedness with hypotension: Yes Has patient had a PCN reaction causing severe rash involving mucus membranes or skin necrosis: No Has patient had a PCN reaction that required hospitalization No Has patient had a PCN reaction occurring within the last 10 years: No If all of the above answers are "NO", then may proceed with Cephalosporin use.      Medication List    STOP taking these medications   acetaminophen 650 MG CR tablet Commonly known as:  TYLENOL   linagliptin 5 MG Tabs tablet Commonly known as:  TRADJENTA   ondansetron 4 MG disintegrating tablet Commonly known as:  ZOFRAN-ODT   traZODone 100 MG tablet Commonly known as:  DESYREL     TAKE these medications   albuterol (2.5 MG/3ML) 0.083% nebulizer solution Commonly known as:  PROVENTIL Take 3 mLs (2.5 mg total) by nebulization every 4 (four) hours as needed for wheezing or shortness of breath.   aspirin EC 81 MG tablet Take 81 mg by mouth daily.   budesonide-formoterol 160-4.5 MCG/ACT inhaler Commonly known as:  SYMBICORT Inhale 2 puffs into the lungs 2 (two) times daily.   Darbepoetin Alfa 100 MCG/0.5ML Sosy injection Commonly known as:  ARANESP Inject 0.5 mLs (100 mcg total) into the vein every Saturday with hemodialysis. Start taking on:  10/14/2017   doxercalciferol 4 MCG/2ML injection Commonly known as:  HECTOROL Inject 2 mLs (4 mcg total) into the vein Every Tuesday,Thursday,and Saturday with dialysis.   escitalopram 10 MG tablet Commonly known as:  LEXAPRO Take 1 tablet (10 mg total) by mouth daily.   feeding supplement (NEPRO CARB STEADY) Liqd Take 237  mLs by mouth daily at 3 pm.   folic acid 1 MG tablet Commonly known as:  FOLVITE Take 1 tablet (1 mg total) by mouth daily.   midodrine 5 MG tablet Commonly known as:  PROAMATINE Take 5 mg by mouth 3 (three) times a week. Take 30 minutes prior to dialysis   multivitamin Tabs tablet Take 1 tablet by mouth at bedtime.   pantoprazole 40 MG tablet Commonly known as:  PROTONIX Take 40 mg by mouth daily.   polyethylene glycol packet Commonly known as:  MIRALAX / GLYCOLAX Take 17 g by mouth daily. What changed:    when to take this  reasons to take this   pregabalin 150 MG capsule Commonly known as:  LYRICA Take 150 mg by mouth 2 (two) times daily.   senna-docusate 8.6-50 MG tablet Commonly known as:  Senokot-S Take 1 tablet by mouth 2 (two) times daily. What changed:    when to take this  reasons to take this   SENSIPAR 30 MG tablet Generic drug:  cinacalcet Take 30 mg by mouth daily with supper.   sevelamer carbonate 800 MG tablet Commonly known as:  RENVELA Take 4 tablets (3,200 mg total) by mouth 3 (three) times daily with meals. What changed:  how much to take        Signed: Barnetta Chapel  10/10/2017, 1:46 PM

## 2017-10-10 NOTE — Progress Notes (Signed)
Subjective: Interval History: has no complaint.  Objective: Vital signs in last 24 hours: Temp:  [97.6 F (36.4 C)-98.2 F (36.8 C)] 97.8 F (36.6 C) (01/22 1257) Pulse Rate:  [52-71] 52 (01/22 1257) Resp:  [16-18] 18 (01/22 1257) BP: (78-118)/(43-84) 106/46 (01/22 1257) SpO2:  [95 %-100 %] 100 % (01/22 1257) Weight:  [78.6 kg (173 lb 4.8 oz)-82.1 kg (180 lb 16 oz)] 79.3 kg (174 lb 13.2 oz) (01/22 1112) Weight change: -3.992 kg (-12.8 oz)  Intake/Output from previous day: 01/21 0701 - 01/22 0700 In: 840 [P.O.:840] Out: -  Intake/Output this shift: Total I/O In: 360 [P.O.:360] Out: 2800 [Other:2800]  General appearance: alert, cooperative, moderately obese and sallow complexion Resp: rales LLL Cardio: S1, S2 normal and systolic murmur: holosystolic 2/6, honking and 2nd M at ULSB at apex GI: Pos FW, nontender Extremities: AVF   Lab Results: Recent Labs    10/09/17 0430 10/10/17 0625  WBC 2.9* 2.7*  HGB 9.5* 9.3*  HCT 31.3* 30.1*  PLT 85* 81*   BMET:  Recent Labs    10/09/17 0945 10/10/17 0638  NA 130* 129*  K 3.4* 3.1*  CL 92* 93*  CO2 25 20*  GLUCOSE 84 134*  BUN 26* 39*  CREATININE 5.49* 6.35*  CALCIUM 7.0* 6.9*   No results for input(s): PTH in the last 72 hours. Iron Studies:  Recent Labs    10/08/17 0711  IRON 50  TIBC 105*    Studies/Results: No results found.  I have reviewed the patient's current medications.  Dialyzes [facility] TTS since 8/18 4h   95kg (?)  2/2.25 bath  Hep 4000  LUA AVF   Assessment: 1 ESRD uremic, vol excess from missed HD. Down 10 kg from paracentesis + HD 2 Uremic ascites 3 Anemia esa 4 HPTH vit D 5 NONADHERENCE primary issue 6 DM 7 Dispo - ok for dc from renal standpoint  P - HD today, UF as tol   LOS: 4 days   Lindsey KrabbeRobert D Marc Sutton 10/10/2017,1:04 PM

## 2017-10-19 ENCOUNTER — Other Ambulatory Visit (HOSPITAL_COMMUNITY): Payer: Self-pay | Admitting: Interventional Radiology

## 2017-10-19 DIAGNOSIS — K7460 Unspecified cirrhosis of liver: Secondary | ICD-10-CM

## 2017-10-20 ENCOUNTER — Other Ambulatory Visit (HOSPITAL_COMMUNITY): Payer: Medicare Other

## 2017-10-23 ENCOUNTER — Telehealth: Payer: Self-pay | Admitting: Adult Health

## 2017-10-23 NOTE — Telephone Encounter (Signed)
-----   Message from Saunders Lake[place]hav[place]umar Bhagat, [place] sent at 10/10/2017  2:50 PM EST -----  Please schedule this patient for a follow-up appointment.  Primary Cardiologist: New to Dr. Rennis GoldenHilty Date of Discharge: 10/10/2017  [facility] [place]. Please get medical records including prior cath and echo.  She will need follow up with Dr. Rennis GoldenHilty in 3-4 week with device check. She has pacemaker.   Thank you! Chelsea AusVin Bhagat, PA-C

## 2017-10-23 NOTE — Telephone Encounter (Signed)
Patient has appointment with Harriet PhoK. Lawrence, DNP on Oct 25, 2017

## 2017-10-25 ENCOUNTER — Ambulatory Visit: Payer: Medicare Other | Admitting: Adult Health

## 2017-10-25 NOTE — Progress Notes (Deleted)
Cardiology Office Note   Date:  10/25/2017   ID:  Lindsey PearCarol Heesch, DOB 04/22/1946, MRN [id number]  PCP:  Pola CornBrown, Rita Harbison, NP  Cardiologist: Dr. Rennis GoldenHilty  No chief complaint on file.    History of Present Illness: Lindsey Sutton is a 72 y.o. female who presents for post hospitalization follow-up after being seen in the hospital on consultation, in the setting of aortic valve stenosis. Other history includes liver cirrhosis, end-stage renal disease on hemodialysis, persistent atrial fibrillation but not on anticoagulation due to fall risk, pacemaker in situ. The patient originally was admitted by overload requiring dialysis and paracentesis.  Echocardiogram during recent hospitalization revealed severe aortic valve stenosis with a mean gradient of 41 mmHg, peak gradient of 70 mg of mercury, and Aortic Valve Area around 0.7 Cm. The Patient Had Normal Ejection Fraction 55% to 60%. She was found to be a poor candidate or TAVR. [place] from [place]. She will need a pacemaker interrogation. The patient is going to be considered for  Consultation by valve clinic, for further evaluation.  Past Medical History:  Diagnosis Date  . A-fib (HCC)   . Anxiety   . Arthritis    "legs" (04/20/2017)  . COPD (chronic obstructive pulmonary disease) (HCC)   . Depression   . ESRD (end stage renal disease) on dialysis (HCC)    "TTS; [facility]; [place]" (04/20/2017)  . Heart murmur   . History of blood transfusion    related to childbirth  . Iron deficiency anemia   . NAFLD (nonalcoholic fatty liver disease) 4/0/98115/01/2017  . Obesity (BMI 30-39.9) 01/20/2017  . Pacemaker   . Pulmonary edema   . Secondary hyperparathyroidism of renal origin (HCC) 01/22/2017  . Systolic heart failure (HCC)   . Thrombocytopenia (HCC) 01/21/2017  . Type II diabetes mellitus (HCC)     Past Surgical History:  Procedure Laterality Date  . AV FISTULA PLACEMENT Left ~ 2003  . AV FISTULA REPAIR Left X  2  . CARDIAC CATHETERIZATION    . CATARACT EXTRACTION W/ INTRAOCULAR LENS  IMPLANT, BILATERAL Bilateral 2017  . FRACTURE SURGERY    . INSERT / REPLACE / REMOVE PACEMAKER  ~ 2012  . INTRAMEDULLARY (IM) NAIL INTERTROCHANTERIC Left 01/20/2017   Procedure: INTRAMEDULLARY (IM) NAIL INTERTROCHANTRIC;  Surgeon: Tarry KosXu, Naiping M, MD;  Location: [facility] OR;  Service: Orthopedics;  Laterality: Left;  . IR PARACENTESIS  10/06/2017     Current Outpatient Medications  Medication Sig Dispense Refill  . albuterol (PROVENTIL) (2.5 MG/3ML) 0.083% nebulizer solution Take 3 mLs (2.5 mg total) by nebulization every 4 (four) hours as needed for wheezing or shortness of breath. 75 mL 12  . aspirin EC 81 MG tablet Take 81 mg by mouth daily.    . budesonide-formoterol (SYMBICORT) 160-4.5 MCG/ACT inhaler Inhale 2 puffs into the lungs 2 (two) times daily.    . cinacalcet (SENSIPAR) 30 MG tablet Take 30 mg by mouth daily with supper.    . Darbepoetin Alfa (ARANESP) 100 MCG/0.5ML SOSY injection Inject 0.5 mLs (100 mcg total) into the vein every Saturday with hemodialysis. 4.2 mL 0  . doxercalciferol (HECTOROL) 4 MCG/2ML injection Inject 2 mLs (4 mcg total) into the vein Every Tuesday,Thursday,and Saturday with dialysis. 2 mL 0  . escitalopram (LEXAPRO) 10 MG tablet Take 1 tablet (10 mg total) by mouth daily.    . folic acid (FOLVITE) 1 MG tablet Take 1 tablet (1 mg total) by mouth daily.    . midodrine (PROAMATINE) 5  MG tablet Take 5 mg by mouth 3 (three) times a week. Take 30 minutes prior to dialysis    . multivitamin (RENA-VIT) TABS tablet Take 1 tablet by mouth at bedtime. 30 tablet 0  . Nutritional Supplements (FEEDING SUPPLEMENT, NEPRO CARB STEADY,) LIQD Take 237 mLs by mouth daily at 3 pm. (Patient not taking: Reported on 10/06/2017)  0  . pantoprazole (PROTONIX) 40 MG tablet Take 40 mg by mouth daily.    . polyethylene glycol (MIRALAX / GLYCOLAX) packet Take 17 g by mouth daily. (Patient taking differently: Take 17 g by  mouth daily as needed for mild constipation. ) 14 each 0  . pregabalin (LYRICA) 150 MG capsule Take 150 mg by mouth 2 (two) times daily.     Marland Kitchen senna-docusate (SENOKOT-S) 8.6-50 MG tablet Take 1 tablet by mouth 2 (two) times daily. (Patient taking differently: Take 1 tablet by mouth 2 (two) times daily as needed for mild constipation. )    . sevelamer carbonate (RENVELA) 800 MG tablet Take 4 tablets (3,200 mg total) by mouth 3 (three) times daily with meals. 360 tablet 0   No current facility-administered medications for this visit.     Allergies:   Penicillins    Social History:  The patient  reports that she quit smoking about 21 years ago. Her smoking use included cigarettes. She has a 33.00 pack-year smoking history. she has never used smokeless tobacco. She reports that she does not drink alcohol or use drugs.   Family History:  The patient's family history includes Diabetes in her mother; Hypertension in her father.    ROS: All other systems are reviewed and negative. Unless otherwise mentioned in H&P    PHYSICAL EXAM: VS:  There were no vitals taken for this visit. , BMI There is no height or weight on file to calculate BMI. GEN: Well nourished, well developed, in no acute distress  HEENT: normal  Neck: no JVD, carotid bruits, or masses Cardiac: ***RRR; no murmurs, rubs, or gallops,no edema  Respiratory:  clear to auscultation bilaterally, normal work of breathing GI: soft, nontender, nondistended, + BS MS: no deformity or atrophy  Skin: warm and dry, no rash Neuro:  Strength and sensation are intact Psych: euthymic mood, full affect   EKG:  EKG {ACTION; IS/IS [id number]} ordered today. The ekg ordered today demonstrates ***   Recent Labs: 04/23/2017: Magnesium 1.9 10/08/2017: ALT 20 10/10/2017: BUN 39; Creatinine, Ser 6.35; Hemoglobin 9.3; Platelets 81; Potassium 3.1; Sodium 129    Lipid Panel No results found for: CHOL, TRIG, HDL, CHOLHDL, VLDL, LDLCALC, LDLDIRECT      Wt Readings from Last 3 Encounters:  10/10/17 174 lb 13.2 oz (79.3 kg)  04/25/17 205 lb 14.6 oz (93.4 kg)  01/25/17 204 lb 2.3 oz (92.6 kg)      Other studies Reviewed: Additional studies/ records that were reviewed today include: ***. Review of the above records demonstrates: ***   ASSESSMENT AND PLAN:  1.  ***   Current medicines are reviewed at length with the patient today.    Labs/ tests ordered today include: *** Bettey Mare. Liborio Nixon, ANP, AACC   10/25/2017 7:43 AM    [facility] [address] [place], [place] [address] Phone: [phone]; Fax: [phone]

## 2017-10-26 ENCOUNTER — Encounter: Payer: Self-pay | Admitting: *Deleted

## 2017-10-27 ENCOUNTER — Encounter (HOSPITAL_COMMUNITY): Payer: Self-pay | Admitting: General Surgery

## 2017-10-27 ENCOUNTER — Ambulatory Visit (HOSPITAL_COMMUNITY)
Admission: RE | Admit: 2017-10-27 | Discharge: 2017-10-27 | Disposition: A | Discharge status: Home / Self Care | Payer: Medicare Other | Source: Ambulatory Visit | Attending: Interventional Radiology | Admitting: Interventional Radiology

## 2017-10-27 DIAGNOSIS — R188 Other ascites: Secondary | ICD-10-CM | POA: Diagnosis not present

## 2017-10-27 DIAGNOSIS — N186 End stage renal disease: Secondary | ICD-10-CM | POA: Insufficient documentation

## 2017-10-27 DIAGNOSIS — Z992 Dependence on renal dialysis: Secondary | ICD-10-CM | POA: Diagnosis not present

## 2017-10-27 DIAGNOSIS — K7460 Unspecified cirrhosis of liver: Secondary | ICD-10-CM

## 2017-10-27 HISTORY — PX: IR PARACENTESIS: IMG2679

## 2017-10-27 MED ORDER — ALBUMIN HUMAN 25 % IV SOLN
100.0000 g | Freq: Once | INTRAVENOUS | Status: AC
  Administered 2017-10-27: 100 g via INTRAVENOUS
  Filled 2017-10-27: qty 400

## 2017-10-27 MED ORDER — LIDOCAINE 2% (20 MG/ML) 5 ML SYRINGE
INTRAMUSCULAR | Status: AC
  Filled 2017-10-27: qty 10

## 2017-10-27 MED ORDER — LIDOCAINE HCL (PF) 2 % IJ SOLN
INTRAMUSCULAR | Status: AC | PRN
  Administered 2017-10-27: 10 mL

## 2017-10-27 NOTE — Procedures (Signed)
Ultrasound-guided therapeutic paracentesis performed yielding 13 liters of serous colored fluid. No immediate complications. Please see dictation for full details regarding procedure.  [name] 3:54 PM 10/27/2017

## 2017-11-13 ENCOUNTER — Other Ambulatory Visit (HOSPITAL_COMMUNITY): Payer: Self-pay | Admitting: Nephrology

## 2017-11-13 DIAGNOSIS — K7031 Alcoholic cirrhosis of liver with ascites: Secondary | ICD-10-CM

## 2017-11-13 DIAGNOSIS — K7030 Alcoholic cirrhosis of liver without ascites: Secondary | ICD-10-CM

## 2017-11-15 ENCOUNTER — Ambulatory Visit (HOSPITAL_COMMUNITY)
Admission: RE | Admit: 2017-11-15 | Discharge: 2017-11-15 | Disposition: A | Discharge status: Home / Self Care | Payer: Medicare Other | Source: Ambulatory Visit | Attending: Nephrology | Admitting: Nephrology

## 2017-11-15 ENCOUNTER — Encounter (HOSPITAL_COMMUNITY): Payer: Self-pay | Admitting: Student

## 2017-11-15 DIAGNOSIS — K7031 Alcoholic cirrhosis of liver with ascites: Secondary | ICD-10-CM

## 2017-11-15 DIAGNOSIS — R188 Other ascites: Secondary | ICD-10-CM | POA: Diagnosis not present

## 2017-11-15 DIAGNOSIS — K7030 Alcoholic cirrhosis of liver without ascites: Secondary | ICD-10-CM

## 2017-11-15 HISTORY — PX: IR PARACENTESIS: IMG2679

## 2017-11-15 MED ORDER — LIDOCAINE HCL (PF) 2 % IJ SOLN
INTRAMUSCULAR | Status: DC | PRN
  Administered 2017-11-15: 10 mL

## 2017-11-15 MED ORDER — LIDOCAINE 2% (20 MG/ML) 5 ML SYRINGE
INTRAMUSCULAR | Status: AC
  Filled 2017-11-15: qty 10

## 2017-11-15 MED ORDER — ALBUMIN HUMAN 25 % IV SOLN
50.0000 g | Freq: Once | INTRAVENOUS | Status: AC
  Administered 2017-11-15: 50 g via INTRAVENOUS
  Filled 2017-11-15: qty 200

## 2017-11-15 NOTE — Procedures (Signed)
PROCEDURE SUMMARY:  Successful US guided paracentesis from left lateral abdomen.  Yielded 13.0 liters of clear, yellow fluid.  No immediate complications.  Pt tolerated well.   Specimen was not sent for labs. Patient received albumin after last paracentesis and daughter requests again today to ensure stable and ready for dialysis tomorrow.  Will order 6g 25% albumin for every liter after 5.   Hoyt KochKacie Sue-Ellen Matthews PA-C 11/15/2017 2:35 PM

## 2017-11-23 ENCOUNTER — Observation Stay (HOSPITAL_COMMUNITY)
Admission: EM | Admit: 2017-11-23 | Discharge: 2017-11-24 | Disposition: A | Discharge status: Home / Self Care | Payer: Medicare Other | Attending: Family Medicine | Admitting: Family Medicine

## 2017-11-23 ENCOUNTER — Emergency Department (HOSPITAL_COMMUNITY): Payer: Medicare Other

## 2017-11-23 ENCOUNTER — Encounter (HOSPITAL_COMMUNITY): Payer: Self-pay | Admitting: Emergency Medicine

## 2017-11-23 ENCOUNTER — Other Ambulatory Visit: Payer: Self-pay

## 2017-11-23 DIAGNOSIS — R2689 Other abnormalities of gait and mobility: Secondary | ICD-10-CM | POA: Diagnosis not present

## 2017-11-23 DIAGNOSIS — F329 Major depressive disorder, single episode, unspecified: Secondary | ICD-10-CM | POA: Diagnosis not present

## 2017-11-23 DIAGNOSIS — Z992 Dependence on renal dialysis: Secondary | ICD-10-CM | POA: Diagnosis not present

## 2017-11-23 DIAGNOSIS — N2581 Secondary hyperparathyroidism of renal origin: Secondary | ICD-10-CM | POA: Diagnosis not present

## 2017-11-23 DIAGNOSIS — N186 End stage renal disease: Secondary | ICD-10-CM | POA: Diagnosis present

## 2017-11-23 DIAGNOSIS — E669 Obesity, unspecified: Secondary | ICD-10-CM | POA: Diagnosis not present

## 2017-11-23 DIAGNOSIS — R188 Other ascites: Secondary | ICD-10-CM | POA: Insufficient documentation

## 2017-11-23 DIAGNOSIS — Z7982 Long term (current) use of aspirin: Secondary | ICD-10-CM | POA: Insufficient documentation

## 2017-11-23 DIAGNOSIS — I9589 Other hypotension: Secondary | ICD-10-CM | POA: Insufficient documentation

## 2017-11-23 DIAGNOSIS — E1122 Type 2 diabetes mellitus with diabetic chronic kidney disease: Secondary | ICD-10-CM | POA: Diagnosis not present

## 2017-11-23 DIAGNOSIS — J90 Pleural effusion, not elsewhere classified: Secondary | ICD-10-CM | POA: Insufficient documentation

## 2017-11-23 DIAGNOSIS — I5023 Acute on chronic systolic (congestive) heart failure: Principal | ICD-10-CM | POA: Insufficient documentation

## 2017-11-23 DIAGNOSIS — M8580 Other specified disorders of bone density and structure, unspecified site: Secondary | ICD-10-CM | POA: Diagnosis not present

## 2017-11-23 DIAGNOSIS — Z87891 Personal history of nicotine dependence: Secondary | ICD-10-CM | POA: Insufficient documentation

## 2017-11-23 DIAGNOSIS — D631 Anemia in chronic kidney disease: Secondary | ICD-10-CM | POA: Diagnosis not present

## 2017-11-23 DIAGNOSIS — Z6834 Body mass index (BMI) 34.0-34.9, adult: Secondary | ICD-10-CM | POA: Diagnosis not present

## 2017-11-23 DIAGNOSIS — I132 Hypertensive heart and chronic kidney disease with heart failure and with stage 5 chronic kidney disease, or end stage renal disease: Secondary | ICD-10-CM | POA: Diagnosis not present

## 2017-11-23 DIAGNOSIS — Z950 Presence of cardiac pacemaker: Secondary | ICD-10-CM | POA: Insufficient documentation

## 2017-11-23 DIAGNOSIS — K760 Fatty (change of) liver, not elsewhere classified: Secondary | ICD-10-CM | POA: Diagnosis not present

## 2017-11-23 DIAGNOSIS — J449 Chronic obstructive pulmonary disease, unspecified: Secondary | ICD-10-CM | POA: Insufficient documentation

## 2017-11-23 DIAGNOSIS — K219 Gastro-esophageal reflux disease without esophagitis: Secondary | ICD-10-CM | POA: Insufficient documentation

## 2017-11-23 DIAGNOSIS — E8770 Fluid overload, unspecified: Secondary | ICD-10-CM

## 2017-11-23 DIAGNOSIS — R0602 Shortness of breath: Secondary | ICD-10-CM | POA: Diagnosis present

## 2017-11-23 DIAGNOSIS — I4891 Unspecified atrial fibrillation: Secondary | ICD-10-CM | POA: Diagnosis not present

## 2017-11-23 DIAGNOSIS — D509 Iron deficiency anemia, unspecified: Secondary | ICD-10-CM | POA: Insufficient documentation

## 2017-11-23 DIAGNOSIS — I700 Atherosclerosis of aorta: Secondary | ICD-10-CM | POA: Insufficient documentation

## 2017-11-23 DIAGNOSIS — Z79899 Other long term (current) drug therapy: Secondary | ICD-10-CM | POA: Insufficient documentation

## 2017-11-23 DIAGNOSIS — F419 Anxiety disorder, unspecified: Secondary | ICD-10-CM | POA: Diagnosis not present

## 2017-11-23 DIAGNOSIS — Z880 Allergy status to penicillin: Secondary | ICD-10-CM | POA: Insufficient documentation

## 2017-11-23 DIAGNOSIS — E1129 Type 2 diabetes mellitus with other diabetic kidney complication: Secondary | ICD-10-CM | POA: Diagnosis present

## 2017-11-23 LAB — CBC
HCT: 25.3 % — ABNORMAL LOW (ref 36.0–46.0)
Hemoglobin: 8 g/dL — ABNORMAL LOW (ref 12.0–15.0)
MCH: 29.7 pg (ref 26.0–34.0)
MCHC: 31.6 g/dL (ref 30.0–36.0)
MCV: 94.1 fL (ref 78.0–100.0)
Platelets: 178 10*3/uL (ref 150–400)
RBC: 2.69 MIL/uL — AB (ref 3.87–5.11)
RDW: 16.8 % — ABNORMAL HIGH (ref 11.5–15.5)
WBC: 3.6 10*3/uL — AB (ref 4.0–10.5)

## 2017-11-23 LAB — I-STAT TROPONIN, ED: Troponin i, poc: 0.04 ng/mL (ref 0.00–0.08)

## 2017-11-23 LAB — COMPREHENSIVE METABOLIC PANEL
ALT: 9 U/L — AB (ref 14–54)
ANION GAP: 14 (ref 5–15)
AST: 13 U/L — AB (ref 15–41)
Albumin: 2.3 g/dL — ABNORMAL LOW (ref 3.5–5.0)
Alkaline Phosphatase: 69 U/L (ref 38–126)
BUN: 35 mg/dL — ABNORMAL HIGH (ref 6–20)
CHLORIDE: 93 mmol/L — AB (ref 101–111)
CO2: 26 mmol/L (ref 22–32)
Calcium: 8 mg/dL — ABNORMAL LOW (ref 8.9–10.3)
Creatinine, Ser: 4.1 mg/dL — ABNORMAL HIGH (ref 0.44–1.00)
GFR calc non Af Amer: 10 mL/min — ABNORMAL LOW (ref >60)
GFR, EST AFRICAN AMERICAN: 12 mL/min — AB (ref >60)
GLUCOSE: 89 mg/dL (ref 65–99)
POTASSIUM: 4.4 mmol/L (ref 3.5–5.1)
Sodium: 133 mmol/L — ABNORMAL LOW (ref 135–145)
Total Bilirubin: 0.9 mg/dL (ref 0.3–1.2)
Total Protein: 5.2 g/dL — ABNORMAL LOW (ref 6.5–8.1)

## 2017-11-23 LAB — BRAIN NATRIURETIC PEPTIDE: B Natriuretic Peptide: 1539.3 pg/mL — ABNORMAL HIGH (ref 0.0–100.0)

## 2017-11-23 MED ORDER — ACETAMINOPHEN 650 MG RE SUPP: 650.0000 mg | Freq: Four times a day (QID) | RECTAL | Status: DC | PRN

## 2017-11-23 MED ORDER — PREGABALIN 25 MG PO CAPS
150.0000 mg | ORAL_CAPSULE | Freq: Two times a day (BID) | ORAL | Status: DC
  Administered 2017-11-23 – 2017-11-24 (×2): 150 mg via ORAL
  Filled 2017-11-23: qty 1
  Filled 2017-11-23: qty 2

## 2017-11-23 MED ORDER — RENA-VITE PO TABS
1.0000 | ORAL_TABLET | Freq: Every day | ORAL | Status: DC
  Administered 2017-11-23: 1 via ORAL
  Filled 2017-11-23: qty 1

## 2017-11-23 MED ORDER — ONDANSETRON HCL 4 MG/2ML IJ SOLN: 4.0000 mg | Freq: Four times a day (QID) | INTRAMUSCULAR | Status: DC | PRN

## 2017-11-23 MED ORDER — ESCITALOPRAM OXALATE 10 MG PO TABS
10.0000 mg | ORAL_TABLET | Freq: Every day | ORAL | Status: DC
  Administered 2017-11-24: 10 mg via ORAL
  Filled 2017-11-23: qty 1

## 2017-11-23 MED ORDER — ALBUTEROL SULFATE (2.5 MG/3ML) 0.083% IN NEBU: 2.5000 mg | INHALATION_SOLUTION | RESPIRATORY_TRACT | Status: DC | PRN

## 2017-11-23 MED ORDER — ALBUMIN HUMAN 25 % IV SOLN
12.5000 g | INTRAVENOUS | Status: DC | PRN
  Administered 2017-11-24: 50 g via INTRAVENOUS
  Filled 2017-11-23: qty 200

## 2017-11-23 MED ORDER — ONDANSETRON HCL 4 MG PO TABS: 4.0000 mg | ORAL_TABLET | Freq: Four times a day (QID) | ORAL | Status: DC | PRN

## 2017-11-23 MED ORDER — FOLIC ACID 1 MG PO TABS
1.0000 mg | ORAL_TABLET | Freq: Every day | ORAL | Status: DC
  Administered 2017-11-24: 1 mg via ORAL
  Filled 2017-11-23: qty 1

## 2017-11-23 MED ORDER — MOMETASONE FURO-FORMOTEROL FUM 200-5 MCG/ACT IN AERO
2.0000 | INHALATION_SPRAY | Freq: Two times a day (BID) | RESPIRATORY_TRACT | Status: DC
  Administered 2017-11-24: 2 via RESPIRATORY_TRACT
  Filled 2017-11-23: qty 8.8

## 2017-11-23 MED ORDER — SEVELAMER CARBONATE 800 MG PO TABS
3200.0000 mg | ORAL_TABLET | Freq: Three times a day (TID) | ORAL | Status: DC
  Administered 2017-11-24 (×2): 3200 mg via ORAL
  Filled 2017-11-23 (×2): qty 4

## 2017-11-23 MED ORDER — PANTOPRAZOLE SODIUM 40 MG PO TBEC
40.0000 mg | DELAYED_RELEASE_TABLET | Freq: Every day | ORAL | Status: DC
  Administered 2017-11-24: 40 mg via ORAL
  Filled 2017-11-23: qty 1

## 2017-11-23 MED ORDER — SENNOSIDES-DOCUSATE SODIUM 8.6-50 MG PO TABS
1.0000 | ORAL_TABLET | Freq: Two times a day (BID) | ORAL | Status: DC
  Administered 2017-11-23 – 2017-11-24 (×2): 1 via ORAL
  Filled 2017-11-23 (×2): qty 1

## 2017-11-23 MED ORDER — CINACALCET HCL 30 MG PO TABS
30.0000 mg | ORAL_TABLET | Freq: Every day | ORAL | Status: DC
  Filled 2017-11-23: qty 1

## 2017-11-23 MED ORDER — ASPIRIN EC 81 MG PO TBEC
81.0000 mg | DELAYED_RELEASE_TABLET | Freq: Every day | ORAL | Status: DC
  Administered 2017-11-24: 81 mg via ORAL
  Filled 2017-11-23: qty 1

## 2017-11-23 MED ORDER — ACETAMINOPHEN 325 MG PO TABS: 650.0000 mg | ORAL_TABLET | Freq: Four times a day (QID) | ORAL | Status: DC | PRN

## 2017-11-23 MED ORDER — MIDODRINE HCL 5 MG PO TABS
5.0000 mg | ORAL_TABLET | ORAL | Status: DC
  Administered 2017-11-24: 5 mg via ORAL
  Filled 2017-11-23: qty 1

## 2017-11-23 NOTE — ED Notes (Signed)
Patient transported to X-ray 

## 2017-11-23 NOTE — H&P (Addendum)
History and Physical    Lindsey PearCarol Sutton WUX:[id number] DOB: 09/29/1945 DOA: 11/23/2017  PCP: Pola CornBrown, Rita Harbison, NP  Patient coming from: Home  I have personally briefly reviewed [facility]  Chief Complaint: SOB  HPI: Lindsey PearCarol Sutton is a 72 y.o. female with medical history significant of ESRD on HD TTS, systolic CHF, A.Fib with PPM, DM2, NAFLD requiring biweekly paracentesis.  Patient presents to the ED for evaluation of chest pressure and SOB onset about 330 this afternoon while at dialysis.  Was able to complete dialysis and returned home but daughter concerned about WOB so patient brought to ED.  Per family about 50lbs of wt gain since Sat.  Abd distention and swelling.  Last paracentesis 1 week ago, re accumulating fluid faster than she usually does.  Did miss dialysis on Tues this week.   ED Course: CXR shows some pulm vasc congestion.  BNP 1500.  Trop neg.   Review of Systems: As per HPI otherwise 10 point review of systems negative.   Past Medical History:  Diagnosis Date  . A-fib (HCC)   . Anxiety   . Arthritis    "legs" (04/20/2017)  . COPD (chronic obstructive pulmonary disease) (HCC)   . Depression   . ESRD (end stage renal disease) on dialysis (HCC)    "TTS; [facility]; [place]" (04/20/2017)  . Heart murmur   . History of blood transfusion    related to childbirth  . Iron deficiency anemia   . NAFLD (nonalcoholic fatty liver disease) 2/5/36645/01/2017  . Obesity (BMI 30-39.9) 01/20/2017  . Pacemaker   . Pulmonary edema   . Secondary hyperparathyroidism of renal origin (HCC) 01/22/2017  . Systolic heart failure (HCC)   . Thrombocytopenia (HCC) 01/21/2017  . Type II diabetes mellitus (HCC)     Past Surgical History:  Procedure Laterality Date  . AV FISTULA PLACEMENT Left ~ 2003  . AV FISTULA REPAIR Left X 2  . CARDIAC CATHETERIZATION    . CATARACT EXTRACTION W/ INTRAOCULAR LENS  IMPLANT, BILATERAL Bilateral 2017  . FRACTURE SURGERY      . INSERT / REPLACE / REMOVE PACEMAKER  ~ 2012  . INTRAMEDULLARY (IM) NAIL INTERTROCHANTERIC Left 01/20/2017   Procedure: INTRAMEDULLARY (IM) NAIL INTERTROCHANTRIC;  Surgeon: Tarry KosXu, Naiping M, MD;  Location: [facility] OR;  Service: Orthopedics;  Laterality: Left;  . IR PARACENTESIS  10/06/2017  . IR PARACENTESIS  10/27/2017  . IR PARACENTESIS  11/15/2017     reports that she quit smoking about 21 years ago. Her smoking use included cigarettes. She has a 33.00 pack-year smoking history. she has never used smokeless tobacco. She reports that she does not drink alcohol or use drugs.  Allergies  Allergen Reactions  . Penicillins Swelling    Has patient had a PCN reaction causing immediate rash, facial/tongue/throat swelling, SOB or lightheadedness with hypotension: Yes Has patient had a PCN reaction causing severe rash involving mucus membranes or skin necrosis: No Has patient had a PCN reaction that required hospitalization No Has patient had a PCN reaction occurring within the last 10 years: No If all of the above answers are "NO", then may proceed with Cephalosporin use.     Family History  Problem Relation Age of Onset  . Diabetes Mother   . Hypertension Father      Prior to Admission medications   Medication Sig Start Date End Date Taking? Authorizing Provider  budesonide-formoterol (SYMBICORT) 160-4.5 MCG/ACT inhaler Inhale 2 puffs into the lungs 2 (two) times  daily.   Yes [provider]  cinacalcet (SENSIPAR) 30 MG tablet Take 30 mg by mouth daily with supper.   Yes [provider]  Darbepoetin Alfa (ARANESP) 100 MCG/0.5ML SOSY injection Inject 0.5 mLs (100 mcg total) into the vein every Saturday with hemodialysis. 10/14/17  Yes Barnetta Chapel, MD  doxercalciferol (HECTOROL) 4 MCG/2ML injection Inject 2 mLs (4 mcg total) into the vein Every Tuesday,Thursday,and Saturday with dialysis. 10/10/17  Yes Berton Mount I, MD  escitalopram (LEXAPRO) 10 MG tablet Take 1 tablet  (10 mg total) by mouth daily. 04/25/17  Yes Johnson, Clanford L, MD  midodrine (PROAMATINE) 5 MG tablet Take 5 mg by mouth 3 (three) times a week. Take 30 minutes prior to dialysis   Yes [provider]  multivitamin (RENA-VIT) TABS tablet Take 1 tablet by mouth at bedtime. 10/10/17  Yes Berton Mount I, MD  pregabalin (LYRICA) 150 MG capsule Take 150 mg by mouth 2 (two) times daily.    Yes [provider]  sevelamer carbonate (RENVELA) 800 MG tablet Take 4 tablets (3,200 mg total) by mouth 3 (three) times daily with meals. 10/10/17  Yes Berton Mount I, MD  albuterol (PROVENTIL) (2.5 MG/3ML) 0.083% nebulizer solution Take 3 mLs (2.5 mg total) by nebulization every 4 (four) hours as needed for wheezing or shortness of breath. Patient not taking: Reported on 11/23/2017 04/25/17   Cleora Fleet, MD  aspirin EC 81 MG tablet Take 81 mg by mouth daily.    [provider]  folic acid (FOLVITE) 1 MG tablet Take 1 tablet (1 mg total) by mouth daily. Patient not taking: Reported on 11/23/2017 04/25/17   Cleora Fleet, MD  Nutritional Supplements (FEEDING SUPPLEMENT, NEPRO CARB STEADY,) LIQD Take 237 mLs by mouth daily at 3 pm. Patient not taking: Reported on 10/06/2017 04/25/17   Cleora Fleet, MD  pantoprazole (PROTONIX) 40 MG tablet Take 40 mg by mouth daily.    [provider]  polyethylene glycol (MIRALAX / GLYCOLAX) packet Take 17 g by mouth daily. Patient not taking: Reported on 11/23/2017 04/25/17   Standley Dakins L, MD  senna-docusate (SENOKOT-S) 8.6-50 MG tablet Take 1 tablet by mouth 2 (two) times daily. Patient not taking: Reported on 11/23/2017 01/26/17   Cathren Harsh, MD    Physical Exam: Vitals:   11/23/17 1850 11/23/17 1851 11/23/17 1900 11/23/17 2034  BP: 100/67  (!) 97/59 100/68  Pulse:   66 68  Resp: 16  17 16   Temp: 97.8 F (36.6 C)     TempSrc: Oral     SpO2: 98%  100% 100%  Weight:  79.4 kg (175 lb)    Height:  5\' 7"  (1.702 m)        Constitutional: NAD, calm, comfortable Eyes: PERRL, lids and conjunctivae icteric ENMT: Mucous membranes are moist. Posterior pharynx clear of any exudate or lesions.Normal dentition.  Neck: normal, supple, no masses, no thyromegaly Respiratory: clear to auscultation bilaterally, no wheezing, no crackles. Normal respiratory effort. No accessory muscle use.  Cardiovascular: Regular rate and rhythm, no murmurs / rubs / gallops. 1+ extremity edema. 2+ pedal pulses. No carotid bruits.  Abdomen: Distended with massive ascites Musculoskeletal: no clubbing / cyanosis. No joint deformity upper and lower extremities. Good ROM, no contractures. Normal muscle tone.  Skin: L foot ulcers, jaundiced Neurologic: CN 2-12 grossly intact. Sensation intact, DTR normal. Strength 5/5 in all 4.  Psychiatric: Normal judgment and insight. Alert and oriented x 3. Normal mood.  Labs on Admission: I have personally reviewed following labs and imaging studies  CBC: Recent Labs  Lab 11/23/17 1839  WBC 3.6*  HGB 8.0*  HCT 25.3*  MCV 94.1  PLT 178   Basic Metabolic Panel: Recent Labs  Lab 11/23/17 1839  NA 133*  K 4.4  CL 93*  CO2 26  GLUCOSE 89  BUN 35*  CREATININE 4.10*  CALCIUM 8.0*   GFR: Estimated Creatinine Clearance: 13.6 mL/min (A) (by C-G formula based on SCr of 4.1 mg/dL (H)). Liver Function Tests: Recent Labs  Lab 11/23/17 1839  AST 13*  ALT 9*  ALKPHOS 69  BILITOT 0.9  PROT 5.2*  ALBUMIN 2.3*   No results for input(s): LIPASE, AMYLASE in the last 168 hours. No results for input(s): AMMONIA in the last 168 hours. Coagulation Profile: No results for input(s): INR, PROTIME in the last 168 hours. Cardiac Enzymes: No results for input(s): CKTOTAL, CKMB, CKMBINDEX, TROPONINI in the last 168 hours. BNP (last 3 results) No results for input(s): PROBNP in the last 8760 hours. HbA1C: No results for input(s): HGBA1C in the last 72 hours. CBG: No results for input(s): GLUCAP in  the last 168 hours. Lipid Profile: No results for input(s): CHOL, HDL, LDLCALC, TRIG, CHOLHDL, LDLDIRECT in the last 72 hours. Thyroid Function Tests: No results for input(s): TSH, T4TOTAL, FREET4, T3FREE, THYROIDAB in the last 72 hours. Anemia Panel: No results for input(s): VITAMINB12, FOLATE, FERRITIN, TIBC, IRON, RETICCTPCT in the last 72 hours. Urine analysis:    Component Value Date/Time   COLORURINE AMBER (A) 01/25/2017 2240   APPEARANCEUR CLOUDY (A) 01/25/2017 2240   LABSPEC 1.014 01/25/2017 2240   PHURINE 6.0 01/25/2017 2240   GLUCOSEU NEGATIVE 01/25/2017 2240   HGBUR MODERATE (A) 01/25/2017 2240   BILIRUBINUR NEGATIVE 01/25/2017 2240   KETONESUR 5 (A) 01/25/2017 2240   PROTEINUR 100 (A) 01/25/2017 2240   NITRITE NEGATIVE 01/25/2017 2240   LEUKOCYTESUR SMALL (A) 01/25/2017 2240    Radiological Exams on Admission: Dg Chest 2 View  Result Date: 11/23/2017 CLINICAL DATA:  Chest pain EXAM: CHEST - 2 VIEW COMPARISON:  10/05/2017 FINDINGS: Small pleural effusions. Cardiomegaly with vascular congestion and mild diffuse interstitial opacities suggesting edema. Streaky atelectasis at the left lung base. Aortic atherosclerosis. Left-sided pacing device as before. IMPRESSION: Cardiomegaly with vascular congestion, small pleural effusions, and mild diffuse interstitial opacities suggesting edema. Electronically Signed   By: Jasmine Pang M.D.   On: 11/23/2017 19:38   Dg Foot Complete Left  Result Date: 11/23/2017 CLINICAL DATA:  Wound EXAM: LEFT FOOT - COMPLETE 3+ VIEW COMPARISON:  None. FINDINGS: No definite acute displaced fracture is seen. Osteopenia. Vascular calcifications. Partially visualized surgical hardware in the distal tibia and fibula. Advanced arthropathy at the first through fifth TMT joints with mild inferior positioning of the metatarsals and cuneiform bones with respect to the navicular and talus. Erosions within the medial aspect of the first cuneiform bone. Cyst in the  head of the first metatarsal. No periostitis. No definitive bony destruction. IMPRESSION: 1. No definite acute osseous abnormality. 2. 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 Mild inferior positioning of the cuneiform and metatarsal bones with respect to the talus and navicular. 3. Partially visualized hardware in the distal tibia and fibula. Electronically Signed   By: Jasmine Pang M.D.   On: 11/23/2017 19:43    EKG: Independently reviewed.  Assessment/Plan Principal Problem:   Ascites Active Problems:   ESRD (end stage renal disease) (HCC)  DM (diabetes mellitus), type 2 with renal complications (HCC)   NAFLD (nonalcoholic fatty liver disease)    1. Ascites - 1. IR paracentesis in AM 2. Albumin with paracentesis 3. Cytology from last time was negative 4. 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 2. ESRD - 1. Left message with nephrology for eval for possible repeat dialysis if needed since missed Tues this week 2. But did just have dialysis today. 3. Chronic hypotension - Continue midodrine 4. DM2 - not currently requiring meds to treat, will leave on carb mod / renal diet  DVT prophylaxis: SCDs Code Status: Full code - confirmed directly with daughter, they are not happy about the DNR that was put in on patient last admit. Family Communication: Daughter at bedside Disposition Plan: Home after admit Consults called: left message for nephrology eval in AM, order put in for US paracentesis in AM Admission status: Place in obs   [name]. DO [facility] Pager [phone]  If 7AM-7PM, please contact day team taking care of patient [email or web address] Password [email or web address]  11/23/2017, 9:21 PM

## 2017-11-23 NOTE — ED Provider Notes (Signed)
[facility] EMERGENCY DEPARTMENT Provider Note   CSN: [id number] Arrival date & time: 11/23/17  1832     History   Chief Complaint Chief Complaint  Patient presents with  . Chest Pain    HPI  Lindsey Sutton is a 71 y.o. Female with a history of ESRD on HD (TThS), systolic heart failure, A. fib with pacemaker, COPD, type 2 diabetes, nonalcoholic fatty liver disease requiring biweekly paracentesis, who presents to the ED for evaluation of chest pressure and shortness of breath which started about 330 this afternoon while the patient was at dialysis, she was able to complete dialysis and returned home but her daughter was concerned about her increased work of breathing so called for EMS to transport her here to the hospital for evaluation.  Patient also notes that at the end of dialysis today her blood pressure was lower than usual, she estimates about 80/50.  Patient continues to endorse some shortness of breath although GERD reports she is feeling a bit better than when, she reports mild chest pressure.  Patient also reports she has had swelling and fluid accumulation on her abdomen, she reports she has paracentesis twice a month typically and last one was 1 week ago, she is reaccumulated fluid much faster than she typically does.  Her daughters report a 50 pound weight gain since Saturday.  Patient denies abdominal pain just discomfort from the distention, no fevers or chills, nausea or vomiting.  No diarrhea or blood in the stool.  Patient reports she often has been short of breath sensation when she needs a paracentesis.  Daughters reports she missed dialysis on Tuesday due to feeling generally unwell, but is able to complete her dialysis today.  Daughters report some noncompliance with medications.  There is also report that at dialysis they have noted some wounds to her left foot that they feel she should have evaluated.       Past Medical History:  Diagnosis Date  .  A-fib (HCC)   . Anxiety   . Arthritis    "legs" (04/20/2017)  . COPD (chronic obstructive pulmonary disease) (HCC)   . Depression   . ESRD (end stage renal disease) on dialysis (HCC)    "TTS; [facility]; [place]" (04/20/2017)  . Heart murmur   . History of blood transfusion    related to childbirth  . Iron deficiency anemia   . NAFLD (nonalcoholic fatty liver disease) 4/0/9811  . Obesity (BMI 30-39.9) 01/20/2017  . Pacemaker   . Pulmonary edema   . Secondary hyperparathyroidism of renal origin (HCC) 01/22/2017  . Systolic heart failure (HCC)   . Thrombocytopenia (HCC) 01/21/2017  . Type II diabetes mellitus [facility])     Patient Active Problem List   Diagnosis Date Noted  . Acute respiratory distress 10/06/2017  . Palliative care by specialist   . Acute metabolic encephalopathy 04/20/2017  . Gait instability 04/20/2017  . Frequent falls 04/20/2017  . Anemia in chronic kidney disease (CKD) 04/20/2017  . Pancytopenia (HCC) 01/23/2017  . Secondary hyperparathyroidism of renal origin (HCC) 01/22/2017  . Hyperphosphatemia 01/22/2017  . Aortic stenosis, moderate-severe 01/22/2017  . Essential hypertension 01/21/2017  . Hyponatremia 01/21/2017  . NAFLD (nonalcoholic fatty liver disease) 91/47/8295  . Thrombocytopenia (HCC) 01/21/2017  . Obesity (BMI 30-39.9) 01/20/2017  . Hip fx (HCC) 01/19/2017  . A-fib (HCC) 01/19/2017  . ESRD (end stage renal disease) (HCC) 01/19/2017  . Pacemaker   . Iron deficiency anemia   . Systolic heart failure (HCC)   .  Ascites   . DM (diabetes mellitus), type 2 with renal complications (HCC)   . Nondisplaced intertrochanteric fracture of left femur, initial encounter for closed fracture [facility](HCC)     Past Surgical History:  Procedure Laterality Date  . AV FISTULA PLACEMENT Left ~ 2003  . AV FISTULA REPAIR Left X 2  . CARDIAC CATHETERIZATION    . CATARACT EXTRACTION W/ INTRAOCULAR LENS  IMPLANT, BILATERAL Bilateral 2017  . FRACTURE SURGERY    . INSERT /  REPLACE / REMOVE PACEMAKER  ~ 2012  . INTRAMEDULLARY (IM) NAIL INTERTROCHANTERIC Left 01/20/2017   Procedure: INTRAMEDULLARY (IM) NAIL INTERTROCHANTRIC;  Surgeon: Tarry KosXu, Naiping M, MD;  Location: [facility] OR;  Service: Orthopedics;  Laterality: Left;  . IR PARACENTESIS  10/06/2017  . IR PARACENTESIS  10/27/2017  . IR PARACENTESIS  11/15/2017    OB History    No data available       Home Medications    Prior to Admission medications   Medication Sig Start Date End Date Taking? Authorizing Provider  albuterol (PROVENTIL) (2.5 MG/3ML) 0.083% nebulizer solution Take 3 mLs (2.5 mg total) by nebulization every 4 (four) hours as needed for wheezing or shortness of breath. 04/25/17   Johnson, Clanford L, MD  aspirin EC 81 MG tablet Take 81 mg by mouth daily.    [provider]  budesonide-formoterol (SYMBICORT) 160-4.5 MCG/ACT inhaler Inhale 2 puffs into the lungs 2 (two) times daily.    [provider]  cinacalcet (SENSIPAR) 30 MG tablet Take 30 mg by mouth daily with supper.    [provider]  Darbepoetin Alfa (ARANESP) 100 MCG/0.5ML SOSY injection Inject 0.5 mLs (100 mcg total) into the vein every Saturday with hemodialysis. 10/14/17   Barnetta Chapelgbata, Sylvester I, MD  doxercalciferol (HECTOROL) 4 MCG/2ML injection Inject 2 mLs (4 mcg total) into the vein Every Tuesday,Thursday,and Saturday with dialysis. 10/10/17   Barnetta Chapelgbata, Sylvester I, MD  escitalopram (LEXAPRO) 10 MG tablet Take 1 tablet (10 mg total) by mouth daily. 04/25/17   Johnson, Clanford L, MD  folic acid (FOLVITE) 1 MG tablet Take 1 tablet (1 mg total) by mouth daily. 04/25/17   Johnson, Clanford L, MD  midodrine (PROAMATINE) 5 MG tablet Take 5 mg by mouth 3 (three) times a week. Take 30 minutes prior to dialysis    [provider]  multivitamin (RENA-VIT) TABS tablet Take 1 tablet by mouth at bedtime. 10/10/17   Barnetta Chapelgbata, Sylvester I, MD  Nutritional Supplements (FEEDING SUPPLEMENT, NEPRO CARB STEADY,) LIQD Take 237 mLs by mouth  daily at 3 pm. Patient not taking: Reported on 10/06/2017 04/25/17   Cleora FleetJohnson, Clanford L, MD  pantoprazole (PROTONIX) 40 MG tablet Take 40 mg by mouth daily.    [provider]  polyethylene glycol (MIRALAX / GLYCOLAX) packet Take 17 g by mouth daily. Patient taking differently: Take 17 g by mouth daily as needed for mild constipation.  04/25/17   Johnson, Clanford L, MD  pregabalin (LYRICA) 150 MG capsule Take 150 mg by mouth 2 (two) times daily.     [provider]  senna-docusate (SENOKOT-S) 8.6-50 MG tablet Take 1 tablet by mouth 2 (two) times daily. Patient taking differently: Take 1 tablet by mouth 2 (two) times daily as needed for mild constipation.  01/26/17   Rai, Ripudeep Kirtland BouchardK, MD  sevelamer carbonate (RENVELA) 800 MG tablet Take 4 tablets (3,200 mg total) by mouth 3 (three) times daily with meals. 10/10/17   Barnetta Chapelgbata, Sylvester I, MD    Family History Family  History  Problem Relation Age of Onset  . Diabetes Mother   . Hypertension Father     Social History Social History   Tobacco Use  . Smoking status: Former Smoker    Packs/day: 1.00    Years: 33.00    Pack years: 33.00    Types: Cigarettes    Last attempt to quit: 09/23/1996    Years since quitting: 21.1  . Smokeless tobacco: Never Used  Substance Use Topics  . Alcohol use: No    Comment: 04/20/2017 "nothing in the 2000s"  . Drug use: No     Allergies   Penicillins   Review of Systems Review of Systems  Constitutional: Negative for chills and fever.  HENT: Negative for congestion, rhinorrhea and sore throat.   Eyes: Negative for visual disturbance.  Respiratory: Positive for shortness of breath. Negative for cough, chest tightness and wheezing.   Cardiovascular: Positive for chest pain and leg swelling. Negative for palpitations.  Gastrointestinal: Positive for abdominal distention. Negative for abdominal pain, diarrhea, nausea and vomiting.  Genitourinary: Negative for dysuria.  Musculoskeletal:  Negative for arthralgias.  Skin: Positive for color change (Jaundice) and wound.  Neurological: Negative for weakness and numbness.  All other systems reviewed and are negative.    Physical Exam Updated Vital Signs BP (!) 97/59   Pulse 66   Temp 97.8 F (36.6 C) (Oral)   Resp 17   Ht 5\' 7"  (1.702 m)   Wt 79.4 kg (175 lb)   SpO2 100%   BMI 27.41 kg/m   Physical Exam  Constitutional: She appears well-developed and well-nourished. She appears ill. No distress.  HENT:  Head: Normocephalic and atraumatic.  Eyes: EOM are normal. Pupils are equal, round, and reactive to light. Right eye exhibits no discharge. Left eye exhibits no discharge.  Neck: Normal range of motion. Neck supple.  Cardiovascular: Intact distal pulses.  Pulmonary/Chest: Effort normal. No respiratory distress. She has wheezes.  Normal respiratory effort, respirations equal, patient able to speak in full sentences, diffuse faint expiratory wheezes throughout bilateral lung fields with good air movement, 100% O2 sat on room air  Abdominal: She exhibits distension.  Abdomen is very distended, bowel sounds present, nontender to palpation in all quadrants without peritoneal signs  Musculoskeletal: She exhibits edema.  Mild 1+ pitting edema to bilateral lower extremities 2 chronic appearing wounds on the heel of the left lower extremity, no drainage, surrounding erythema or warmth  Neurological: She is alert. Coordination normal.  Skin: Skin is warm and dry. Capillary refill takes less than 2 seconds. She is not diaphoretic.  Jaundice  Psychiatric: She has a normal mood and affect. Her behavior is normal.  Nursing note and vitals reviewed.    ED Treatments / Results  Labs (all labs ordered are listed, but only abnormal results are displayed) Labs Reviewed  CBC - Abnormal; Notable for the following components:      Result Value   WBC 3.6 (*)    RBC 2.69 (*)    Hemoglobin 8.0 (*)    HCT 25.3 (*)    RDW 16.8 (*)     All other components within normal limits  COMPREHENSIVE METABOLIC PANEL - Abnormal; Notable for the following components:   Sodium 133 (*)    Chloride 93 (*)    BUN 35 (*)    Creatinine, Ser 4.10 (*)    Calcium 8.0 (*)    Total Protein 5.2 (*)    Albumin 2.3 (*)    AST 13 (*)  ALT 9 (*)    GFR calc non Af Amer 10 (*)    GFR calc Af Amer 12 (*)    All other components within normal limits  BRAIN NATRIURETIC PEPTIDE - Abnormal; Notable for the following components:   B Natriuretic Peptide 1,539.3 (*)    All other components within normal limits  CBC  BASIC METABOLIC PANEL  I-STAT TROPONIN, ED    EKG  EKG Interpretation  Date/Time:  Thursday November 23 2017 18:46:07 EST Ventricular Rate:  67 PR Interval:    QRS Duration: 123 QT Interval:  486 QTC Calculation: 514 R Axis:   -75 Text Interpretation:  Atrial fibrillation Nonspecific IVCD with LAD Since last EKG, PVCs are new Otherwise no significant change Confirmed by Shaune Pollack [phone]) on 11/23/2017 6:54:12 PM       Radiology Dg Chest 2 View  Result Date: 11/23/2017 CLINICAL DATA:  Chest pain EXAM: CHEST - 2 VIEW COMPARISON:  10/05/2017 FINDINGS: Small pleural effusions. Cardiomegaly with vascular congestion and mild diffuse interstitial opacities suggesting edema. Streaky atelectasis at the left lung base. Aortic atherosclerosis. Left-sided pacing device as before. IMPRESSION: Cardiomegaly with vascular congestion, small pleural effusions, and mild diffuse interstitial opacities suggesting edema. Electronically Signed   By: Jasmine Pang M.D.   On: 11/23/2017 19:38   Dg Foot Complete Left  Result Date: 11/23/2017 CLINICAL DATA:  Wound EXAM: LEFT FOOT - COMPLETE 3+ VIEW COMPARISON:  None. FINDINGS: No definite acute displaced fracture is seen. Osteopenia. Vascular calcifications. Partially visualized surgical hardware in the distal tibia and fibula. Advanced arthropathy at the first through fifth TMT joints with mild  inferior positioning of the metatarsals and cuneiform bones with respect to the navicular and talus. Erosions within the medial aspect of the first cuneiform bone. Cyst in the head of the first metatarsal. No periostitis. No definitive bony destruction. IMPRESSION: 1. No definite acute osseous abnormality. 2. 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 Mild inferior positioning of the cuneiform and metatarsal bones with respect to the talus and navicular. 3. Partially visualized hardware in the distal tibia and fibula. Electronically Signed   By: Jasmine Pang M.D.   On: 11/23/2017 19:43    Procedures Procedures (including critical care time)  Medications Ordered in ED Medications - No data to display   Initial Impression / Assessment and Plan / ED Course  I have reviewed the triage vital signs and the nursing notes.  Pertinent labs & imaging results that were available during my care of the patient were reviewed by me and considered in my medical decision making (see chart for details).  Patient presents to the ED for evaluation of shortness of breath and chest pressure.  Patient has accumulated 50 pounds of fluid in less than a week, with severely distended abdomen.  Fluid overload is likely multifactorial as patient has end-stage renal disease, liver disease and heart failure, she did complete her dialysis session today.  On initial evaluation vitals are normal, patient appears to be breathing comfortably on room air, there are diffuse scattered end expiratory wheezes, abdomen is distended but nontender, not concerning for SBP or other acute abdominal process.  She reports mild chest pressure, nonradiating.  Patient reports these are the symptoms she typically has when she needs a paracentesis.   Chest x-ray shows mild pulmonary vascular congestion with small pleural effusions and mild diffuse interstitial opacity suggesting edema.  EKG shows A. fib and PVCs otherwise  unchanged, troponin negative, BNP is 1539.3.  Cardiac workup shows that chest pain is much more likely due to fluid overload rather than ACS.  Patient with chronic electrolyte derangements due to ESRD, all of which are baseline, potassium is 4.4.  There is no leukocytosis, hemoglobin is 8.0 patient with chronic anemia in the setting of CKD.  Family reports wounds to left heel, unsure how long they have been there, x-ray shows no evidence of osteomyelitis but does show malfunction of hardware of the distal tibia and fibula, patient will need to follow-up outpatient with orthopedics regarding this.  Patient with multifactorial fluid overload in the setting of kidney disease, liver disease and CHF.  She will need to be admitted for paracentesis and further management since she is acutely symptomatic with shortness of breath and chest pressure.  Will consult for unassigned medical admission.  9:02 PM Dr. Julian Reil to see and admit the patient, will plan for ultrasound-guided paracentesis tomorrow.  Final Clinical Impressions(s) / ED Diagnoses   Final diagnoses:  Hypervolemia, unspecified hypervolemia type  ESRD (end stage renal disease) on dialysis (HCC)  NAFLD (nonalcoholic fatty liver disease)  Shortness of breath  Acute on chronic systolic heart failure [facility])    ED Discharge Orders    None       Legrand Rams 11/23/17 2200    Shaune Pollack, MD 11/24/17 [phone]

## 2017-11-23 NOTE — ED Triage Notes (Signed)
Pt BIB EMS from home for CP. Pt states it started today, central chest tightness with intermittent SOB, sats 100% RA. Abdominal ascites noted on assessment. Resp e/u; A&Ox4; NAD at this time.

## 2017-11-24 ENCOUNTER — Observation Stay (HOSPITAL_COMMUNITY): Payer: Medicare Other

## 2017-11-24 ENCOUNTER — Other Ambulatory Visit: Payer: Self-pay

## 2017-11-24 ENCOUNTER — Encounter (HOSPITAL_COMMUNITY): Payer: Self-pay | Admitting: Physician Assistant

## 2017-11-24 DIAGNOSIS — Z992 Dependence on renal dialysis: Secondary | ICD-10-CM | POA: Diagnosis not present

## 2017-11-24 DIAGNOSIS — N186 End stage renal disease: Secondary | ICD-10-CM | POA: Diagnosis not present

## 2017-11-24 DIAGNOSIS — I5023 Acute on chronic systolic (congestive) heart failure: Secondary | ICD-10-CM | POA: Diagnosis not present

## 2017-11-24 DIAGNOSIS — R188 Other ascites: Secondary | ICD-10-CM | POA: Diagnosis not present

## 2017-11-24 DIAGNOSIS — R0602 Shortness of breath: Secondary | ICD-10-CM | POA: Diagnosis not present

## 2017-11-24 DIAGNOSIS — E8770 Fluid overload, unspecified: Secondary | ICD-10-CM

## 2017-11-24 DIAGNOSIS — E1122 Type 2 diabetes mellitus with diabetic chronic kidney disease: Secondary | ICD-10-CM | POA: Diagnosis not present

## 2017-11-24 HISTORY — PX: IR PARACENTESIS: IMG2679

## 2017-11-24 LAB — BASIC METABOLIC PANEL
ANION GAP: 13 (ref 5–15)
BUN: 41 mg/dL — AB (ref 6–20)
CO2: 26 mmol/L (ref 22–32)
Calcium: 7.8 mg/dL — ABNORMAL LOW (ref 8.9–10.3)
Chloride: 93 mmol/L — ABNORMAL LOW (ref 101–111)
Creatinine, Ser: 4.64 mg/dL — ABNORMAL HIGH (ref 0.44–1.00)
GFR calc Af Amer: 10 mL/min — ABNORMAL LOW (ref >60)
GFR, EST NON AFRICAN AMERICAN: 9 mL/min — AB (ref >60)
GLUCOSE: 83 mg/dL (ref 65–99)
POTASSIUM: 5 mmol/L (ref 3.5–5.1)
Sodium: 132 mmol/L — ABNORMAL LOW (ref 135–145)

## 2017-11-24 LAB — CBC
HEMATOCRIT: 23.8 % — AB (ref 36.0–46.0)
Hemoglobin: 7.6 g/dL — ABNORMAL LOW (ref 12.0–15.0)
MCH: 30.3 pg (ref 26.0–34.0)
MCHC: 31.9 g/dL (ref 30.0–36.0)
MCV: 94.8 fL (ref 78.0–100.0)
PLATELETS: 173 10*3/uL (ref 150–400)
RBC: 2.51 MIL/uL — AB (ref 3.87–5.11)
RDW: 16.8 % — ABNORMAL HIGH (ref 11.5–15.5)
WBC: 3.8 10*3/uL — ABNORMAL LOW (ref 4.0–10.5)

## 2017-11-24 LAB — MRSA PCR SCREENING: MRSA by PCR: NEGATIVE

## 2017-11-24 MED ORDER — PRO-STAT SUGAR FREE PO LIQD: 30.0000 mL | Freq: Two times a day (BID) | ORAL | Status: DC

## 2017-11-24 MED ORDER — LIDOCAINE HCL (PF) 2 % IJ SOLN
INTRAMUSCULAR | Status: DC | PRN
  Administered 2017-11-24: 10 mL

## 2017-11-24 MED ORDER — MUPIROCIN 2 % EX OINT: 1.0000 "application " | TOPICAL_OINTMENT | Freq: Two times a day (BID) | CUTANEOUS | Status: DC

## 2017-11-24 MED ORDER — NEPRO/CARBSTEADY PO LIQD
237.0000 mL | Freq: Three times a day (TID) | ORAL | Status: DC
Start: 2017-11-24 — End: 2017-11-24

## 2017-11-24 MED ORDER — DOXERCALCIFEROL 4 MCG/2ML IV SOLN: 4.0000 ug | INTRAVENOUS | Status: DC

## 2017-11-24 MED ORDER — DARBEPOETIN ALFA 200 MCG/0.4ML IJ SOSY: 200.0000 ug | PREFILLED_SYRINGE | INTRAMUSCULAR | Status: DC

## 2017-11-24 MED ORDER — CHLORHEXIDINE GLUCONATE CLOTH 2 % EX PADS: 6.0000 | MEDICATED_PAD | Freq: Every day | CUTANEOUS | Status: DC

## 2017-11-24 MED ORDER — LIDOCAINE HCL (PF) 2 % IJ SOLN
INTRAMUSCULAR | Status: AC
  Filled 2017-11-24: qty 10

## 2017-11-24 NOTE — Evaluation (Addendum)
Physical Therapy Evaluation Patient Details Name: Lindsey Sutton MRN: [id number] DOB: 05-16-1946 Today's Date: 11/24/2017   History of Present Illness  Pt is a 72 y.o. female admitted 11/23/17 with chest pressure and SOB; worked up for ascites. S/p paracentesis 3/8. PMH includes ESRD (HD TTS), CHF, COPD, a-fib, pacemaker, NAFLD requiring biweekly paracentesis.    Clinical Impression  Pt presents with an overall decrease in functional mobility secondary to above. PTA, pt mod indep with RW for household ambulation and ADLs; also uses manual w/c in house. Daughters present and report recent decline in pt's function and requiring increased assist. Pt lives with daughter who is typically available for 24/7 supervision, but not able to provide physical assist. Today, pt able to transfer and amb short distance with RW and min guard for balance; dependent for pericare. Recommend SNF-level therapies at d/c to maximize functional mobility and decrease caregiver burden; daughters in strong agreement with this, but pt refusing follow-up PT services. Educ on fall risk reduction and importance of mobility at home. Do not feel pt is safe to return home at this time due to debility, significant fall risk, and the fact that she will not have initial 24/7 support available. Will follow acutely to address established.   Sitting BP 91/53 Standing BP 80/50 Standing 2 min BP 98/43    Follow Up Recommendations SNF;Supervision/Assistance - 24 hour(refusing SNF/follow-up PT)    Equipment Recommendations  None recommended by PT    Recommendations for Other Services       Precautions / Restrictions Precautions Precautions: Fall Restrictions Weight Bearing Restrictions: No      Mobility  Bed Mobility Overal bed mobility: Modified Independent             General bed mobility comments: Use of bed rail; increased time and effort, but no physical assist required  Transfers Overall transfer level: Needs  assistance Equipment used: Rolling walker (2 wheeled) Transfers: Sit to/from Stand Sit to Stand: Min guard         General transfer comment: Able to stand on second attempt from bed with RW and min guard for balance; c/o dizziness requiring return to sit. Stood again from bed and BSC with RW and min guard for balance  Ambulation/Gait Ambulation/Gait assistance: Min guard Ambulation Distance (Feet): 15 Feet Assistive device: Rolling walker (2 wheeled) Gait Pattern/deviations: Shuffle;Trunk flexed Gait velocity: Decreased Gait velocity interpretation: <1.8 ft/sec, indicative of risk for recurrent falls General Gait Details: Slow, forward flexed amb with RW and min guard for balance; pt limited by c/o fatigue   Stairs            Wheelchair Mobility    Modified Rankin (Stroke Patients Only)       Balance Overall balance assessment: Needs assistance   Sitting balance-Leahy Scale: Good Sitting balance - Comments: Able to don R sock; assist to don L sock     Standing balance-Leahy Scale: Poor Standing balance comment: Reliant on UE support; able to perform pericare, but required assist to complete                              Pertinent Vitals/Pain Pain Assessment: No/denies pain    Home Living Family/patient expects to be discharged to:: Private residence Living Arrangements: Children Available Help at Discharge: Family;Available 24 hours/day Type of Home: House Home Access: Level entry     Home Layout: One level Home Equipment: Walker - 2 wheels;Walker - 4 wheels;Bedside commode;Shower  seat;Shower seat - built in;Wheelchair - manual;Hand held shower head;Grab bars - toilet;Grab bars - tub/shower Additional Comments: home is set up to be handicapped accessible including widened doorways.     Prior Function Level of Independence: Needs assistance   Gait / Transfers Assistance Needed: Amb short household distances with RW; sits in w/c most of day (able  to propel indep)  ADL's / Homemaking Assistance Needed: Rolls w/c into shower for transfers; requiring increased assist for ADLs  Comments: Daughters present and report declining function at home     Hand Dominance        Extremity/Trunk Assessment   Upper Extremity Assessment Upper Extremity Assessment: Generalized weakness    Lower Extremity Assessment Lower Extremity Assessment: Generalized weakness       Communication   Communication: No difficulties  Cognition Arousal/Alertness: Awake/alert Behavior During Therapy: WFL for tasks assessed/performed Overall Cognitive Status: Within Functional Limits for tasks assessed                                        General Comments General comments (skin integrity, edema, etc.): Daughters present during session    Exercises     Assessment/Plan    PT Assessment Patient needs continued PT services  PT Problem List Decreased strength;Decreased activity tolerance;Decreased balance;Decreased mobility;Decreased knowledge of use of DME       PT Treatment Interventions DME instruction;Gait training;Functional mobility training;Therapeutic activities;Therapeutic exercise;Balance training;Patient/family education;Wheelchair mobility training    PT Goals (Current goals can be found in the Care Plan section)  Acute Rehab PT Goals Patient Stated Goal: Return home PT Goal Formulation: With patient Time For Goal Achievement: 12/08/17 Potential to Achieve Goals: Fair    Frequency Min 2X/week   Barriers to discharge        Co-evaluation               AM-PAC PT "6 Clicks" Daily Activity  Outcome Measure Difficulty turning over in bed (including adjusting bedclothes, sheets and blankets)?: A Little Difficulty moving from lying on back to sitting on the side of the bed? : A Little Difficulty sitting down on and standing up from a chair with arms (e.g., wheelchair, bedside commode, etc,.)?: A Little Help  needed moving to and from a bed to chair (including a wheelchair)?: A Little Help needed walking in hospital room?: A Lot Help needed climbing 3-5 steps with a railing? : A Lot 6 Click Score: 16    End of Session Equipment Utilized During Treatment: Gait belt Activity Tolerance: Patient limited by fatigue Patient left: in bed;with call bell/phone within reach;with bed alarm set Nurse Communication: Mobility status PT Visit Diagnosis: Other abnormalities of gait and mobility (R26.89);Muscle weakness (generalized) (M62.81)    Time: [id number] PT Time Calculation (min) (ACUTE ONLY): 40 min   Charges:   PT Evaluation $PT Eval Moderate Complexity: 1 Mod PT Treatments $Therapeutic Activity: 8-22 mins $Self Care/Home Management: 8-22   PT G Codes:       Ina HomesJaclyn Alonia Dibuono, PT, DPT Acute Rehab Services  Pager: [phone]  Malachy ChamberJaclyn L Shyra Emile 11/24/2017, 3:22 PM

## 2017-11-24 NOTE — Consult Note (Signed)
WOC Nurse wound consult note Reason for Consult: Consult requested for BLE Wound type: Pt has a partial thickness linear abrasion to anterior great toe; .1X.5X.1cm, pink and dry.  Left arch of foot with protruding bone and dry callous area; no topical treatment is indicated. Right foot and bilat legs without wounds. Dressing procedure/placement/frequency: Bandaid to protect left great toe. Please re-consult if further assistance is needed.  Thank-you,  Cammie Mcgeeawn Gayla Benn MSN, RN, CWOCN, [place], CNS [phone]

## 2017-11-24 NOTE — Procedures (Signed)
PROCEDURE SUMMARY:  Successful US guided paracentesis from left lateral abdomen.  Yielded 15 liters of clear yellow fluid.  No immediate complications.  Pt tolerated well.   Jerry CarasWENDY S Shafter Jupin PA-C 11/24/2017 12:46 PM

## 2017-11-24 NOTE — Consult Note (Addendum)
[facility] Renal Consultation Note    Indication for Consultation:  Management of ESRD/hemodialysis; anemia, hypertension/volume and secondary hyperparathyroidism  ZOX:WRUEA, Mickel Crow, NP  HPI: Lindsey Sutton is a 72 y.o. female. ESRD likely 2/2 DMT2 on HD TTS at [facility]. Past medical history significant for DMT2, HTN, DJD, depression, systolic CHF, h/o Afib with pacemaker, AS, NAFLD requiring biweekly paracentesis, and non adherence to dialysis.  Patient has missed at least 1 treatment a week for the last year and rarely ever stays her full prescribed time.  Of note her last OP HD was 3/7 after missing 3/5, arriving 22.5L over EDW, signing off early and leaving a total of 18.3L over EDW.  NP rounding at dialysis center had end of life discussion with patient regarding compliance with life saving interventions such as dialysis and she stated she would like to continue treatments.   Patient presented to ED 1 day ago with complaints of chest pressure and SOB that caused her to sign off of dialysis early.  Pertinent findings in the ED included severe ascites, CXR indicating alveolar edema and NBP 1500.  Seen and examined at bedside. Admits to 20kg weight gain, SOB, bloating and tightness.  States she accumulated ascites much faster than usual this time.  Denies fever, chills, n/v/d, headache, dizziness and weakness.  Patient admitted for management of volume overload .  Past Medical History:  Diagnosis Date  . A-fib (HCC)   . Anxiety   . Arthritis    "legs" (04/20/2017)  . COPD (chronic obstructive pulmonary disease) (HCC)   . Depression   . ESRD (end stage renal disease) on dialysis (HCC)    "TTS; [facility]; [place]" (04/20/2017)  . Heart murmur   . History of blood transfusion    related to childbirth  . Iron deficiency anemia   . NAFLD (nonalcoholic fatty liver disease) 01/21/980  . Obesity (BMI 30-39.9) 01/20/2017  . Pacemaker   . Pulmonary edema   . Secondary  hyperparathyroidism of renal origin (HCC) 01/22/2017  . Systolic heart failure (HCC)   . Thrombocytopenia (HCC) 01/21/2017  . Type II diabetes mellitus (HCC)    Past Surgical History:  Procedure Laterality Date  . AV FISTULA PLACEMENT Left ~ 2003  . AV FISTULA REPAIR Left X 2  . CARDIAC CATHETERIZATION    . CATARACT EXTRACTION W/ INTRAOCULAR LENS  IMPLANT, BILATERAL Bilateral 2017  . FRACTURE SURGERY    . INSERT / REPLACE / REMOVE PACEMAKER  ~ 2012  . INTRAMEDULLARY (IM) NAIL INTERTROCHANTERIC Left 01/20/2017   Procedure: INTRAMEDULLARY (IM) NAIL INTERTROCHANTRIC;  Surgeon: Tarry Kos, MD;  Location: [facility] OR;  Service: Orthopedics;  Laterality: Left;  . IR PARACENTESIS  10/06/2017  . IR PARACENTESIS  10/27/2017  . IR PARACENTESIS  11/15/2017   Family History  Problem Relation Age of Onset  . Diabetes Mother   . Hypertension Father    Social History:  reports that she quit smoking about 21 years ago. Her smoking use included cigarettes. She has a 33.00 pack-year smoking history. she has never used smokeless tobacco. She reports that she does not drink alcohol or use drugs. Allergies  Allergen Reactions  . Penicillins Swelling    Has patient had a PCN reaction causing immediate rash, facial/tongue/throat swelling, SOB or lightheadedness with hypotension: Yes Has patient had a PCN reaction causing severe rash involving mucus membranes or skin necrosis: No Has patient had a PCN reaction that required hospitalization No Has patient had a PCN reaction occurring within the  last 10 years: No If all of the above answers are "NO", then may proceed with Cephalosporin use.    Prior to Admission medications   Medication Sig Start Date End Date Taking? Authorizing Provider  budesonide-formoterol (SYMBICORT) 160-4.5 MCG/ACT inhaler Inhale 2 puffs into the lungs 2 (two) times daily.   Yes [provider]  cinacalcet (SENSIPAR) 30 MG tablet Take 30 mg by mouth daily with supper.   Yes [provider]  Darbepoetin Alfa (ARANESP) 100 MCG/0.5ML SOSY injection Inject 0.5 mLs (100 mcg total) into the vein every Saturday with hemodialysis. 10/14/17  Yes Barnetta Chapelgbata, Sylvester I, MD  doxercalciferol (HECTOROL) 4 MCG/2ML injection Inject 2 mLs (4 mcg total) into the vein Every Tuesday,Thursday,and Saturday with dialysis. 10/10/17  Yes Berton Mountgbata, Sylvester I, MD  escitalopram (LEXAPRO) 10 MG tablet Take 1 tablet (10 mg total) by mouth daily. 04/25/17  Yes Johnson, Clanford L, MD  midodrine (PROAMATINE) 5 MG tablet Take 5 mg by mouth 3 (three) times a week. Take 30 minutes prior to dialysis   Yes [provider]  multivitamin (RENA-VIT) TABS tablet Take 1 tablet by mouth at bedtime. 10/10/17  Yes Berton Mountgbata, Sylvester I, MD  pregabalin (LYRICA) 150 MG capsule Take 150 mg by mouth 2 (two) times daily.    Yes [provider]  sevelamer carbonate (RENVELA) 800 MG tablet Take 4 tablets (3,200 mg total) by mouth 3 (three) times daily with meals. 10/10/17  Yes Berton Mountgbata, Sylvester I, MD  albuterol (PROVENTIL) (2.5 MG/3ML) 0.083% nebulizer solution Take 3 mLs (2.5 mg total) by nebulization every 4 (four) hours as needed for wheezing or shortness of breath. Patient not taking: Reported on 11/23/2017 04/25/17   Cleora FleetJohnson, Clanford L, MD  aspirin EC 81 MG tablet Take 81 mg by mouth daily.    [provider]  folic acid (FOLVITE) 1 MG tablet Take 1 tablet (1 mg total) by mouth daily. Patient not taking: Reported on 11/23/2017 04/25/17   Cleora FleetJohnson, Clanford L, MD  Nutritional Supplements (FEEDING SUPPLEMENT, NEPRO CARB STEADY,) LIQD Take 237 mLs by mouth daily at 3 pm. Patient not taking: Reported on 10/06/2017 04/25/17   Cleora FleetJohnson, Clanford L, MD  pantoprazole (PROTONIX) 40 MG tablet Take 40 mg by mouth daily.    [provider]  polyethylene glycol (MIRALAX / GLYCOLAX) packet Take 17 g by mouth daily. Patient not taking: Reported on 11/23/2017 04/25/17   Standley DakinsJohnson, Clanford L, MD  senna-docusate (SENOKOT-S)  8.6-50 MG tablet Take 1 tablet by mouth 2 (two) times daily. Patient not taking: Reported on 11/23/2017 01/26/17   Cathren Harshai, Ripudeep K, MD   Current Facility-Administered Medications  Medication Dose Route Frequency Provider Last Rate Last Dose  . acetaminophen (TYLENOL) tablet 650 mg  650 mg Oral Q6H PRN Hillary BowGardner, Jared M, DO       Or  . acetaminophen (TYLENOL) suppository 650 mg  650 mg Rectal Q6H PRN Hillary BowGardner, Jared M, DO      . albumin human 25 % solution 12.5-50 g  12.5-50 g Intravenous PRN Hillary BowGardner, Jared M, DO   50 g at 11/24/17 1132  . albuterol (PROVENTIL) (2.5 MG/3ML) 0.083% nebulizer solution 2.5 mg  2.5 mg Nebulization Q4H PRN Hillary BowGardner, Jared M, DO      . aspirin EC tablet 81 mg  81 mg Oral Daily Julian ReilGardner, Jared M, DO      . cinacalcet (SENSIPAR) tablet 30 mg  30 mg Oral Q supper Hillary BowGardner, Jared M, DO      . escitalopram (LEXAPRO)  tablet 10 mg  10 mg Oral Daily Lyda Perone M, DO      . folic acid (FOLVITE) tablet 1 mg  1 mg Oral Daily Julian Reil, Jared M, DO      . lidocaine (XYLOCAINE) 2 % injection           . lidocaine (XYLOCAINE) 2 % injection    PRN Gershon Crane, PA-C   10 mL at 11/24/17 1016  . midodrine (PROAMATINE) tablet 5 mg  5 mg Oral Once per day on Mon Wed Fri Gardner, Jared M, DO   5 mg at 11/24/17 0900  . mometasone-formoterol (DULERA) 200-5 MCG/ACT inhaler 2 puff  2 puff Inhalation BID [name], DO   2 puff at 11/24/17 0831  . multivitamin (RENA-VIT) tablet 1 tablet  1 tablet Oral QHS [name], DO   1 tablet at 11/23/17 2335  . ondansetron (ZOFRAN) tablet 4 mg  4 mg Oral Q6H PRN [name], DO       Or  . ondansetron [facility]) injection 4 mg  4 mg Intravenous Q6H PRN [name], DO      . pantoprazole (PROTONIX) EC tablet 40 mg  40 mg Oral Daily Lyda Perone M, DO      . pregabalin (LYRICA) capsule 150 mg  150 mg Oral BID [name], DO   150 mg at 11/23/17 2334  . senna-docusate (Senokot-S) tablet 1 tablet  1 tablet Oral BID [name], DO   1 tablet at 11/23/17 2335  . sevelamer carbonate (RENVELA) tablet 3,200 mg  3,200 mg Oral TID WC Lyda Perone M, DO   3,200 mg at 11/24/17 0900   Labs: Basic Metabolic Panel: Recent Labs  Lab 11/23/17 1839 11/24/17 0441  NA 133* 132*  K 4.4 5.0  CL 93* 93*  CO2 26 26  GLUCOSE 89 83  BUN 35* 41*  CREATININE 4.10* 4.64*  CALCIUM 8.0* 7.8*   Liver Function Tests: Recent Labs  Lab 11/23/17 1839  AST 13*  ALT 9*  ALKPHOS 69  BILITOT 0.9  PROT 5.2*  ALBUMIN 2.3*   CBC: Recent Labs  Lab 11/23/17 1839 11/24/17 0441  WBC 3.6* 3.8*  HGB 8.0* 7.6*  HCT 25.3* 23.8*  MCV 94.1 94.8  PLT 178 173    Dg Chest 2 View  Result Date: 11/23/2017 CLINICAL DATA:  Chest pain EXAM: CHEST - 2 VIEW COMPARISON:  10/05/2017 FINDINGS: Small pleural effusions. Cardiomegaly with vascular congestion and mild diffuse interstitial opacities suggesting edema. Streaky atelectasis at the left lung base. Aortic atherosclerosis. Left-sided pacing device as before. IMPRESSION: Cardiomegaly with vascular congestion, small pleural effusions, and mild diffuse interstitial opacities suggesting edema. Electronically Signed   By: Jasmine Pang M.D.   On: 11/23/2017 19:38   Dg Foot Complete Left  Result Date: 11/23/2017 CLINICAL DATA:  Wound EXAM: LEFT FOOT - COMPLETE 3+ VIEW COMPARISON:  None. FINDINGS: No definite acute displaced fracture is seen. Osteopenia. Vascular calcifications. Partially visualized surgical hardware in the distal tibia and fibula. Advanced arthropathy at the first through fifth TMT joints with mild inferior positioning of the metatarsals and cuneiform bones with respect to the navicular and talus. Erosions within the medial aspect of the first cuneiform bone. Cyst in the head of the first metatarsal. No periostitis. No definitive bony destruction. IMPRESSION: 1. No definite acute osseous abnormality. 2. 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 Mild  inferior positioning of the cuneiform and metatarsal  bones with respect to the talus and navicular. 3. Partially visualized hardware in the distal tibia and fibula. Electronically Signed   By: Jasmine Pang M.D.   On: 11/23/2017 19:43    ROS: All others negative except those listed in HPI.  Physical Exam: Vitals:   11/24/17 0543 11/24/17 0832 11/24/17 1016 11/24/17 1100  BP: (!) 93/58  105/71 (!) 84/32  Pulse: 73     Resp: 18     Temp: 97.7 F (36.5 C)     TempSrc: Oral     SpO2: 97% 97%    Weight:      Height:         General: NAD, obese, elderly Head: NCAT sclera not icteric MMM Neck: Supple. No lymphadenopathy Lungs: CTA bilaterally. BS decreased. No wheeze, rales or rhonchi. Breathing is unlabored. Heart: RRR. +3/6 holosystolic murmur radiating to L axilla.  No rubs or gallops.  Abdomen: tense, largely distended, nontender, +BS, no guarding, no rebound tenderness Lower extremities:1+ edema, 2 chronic appearing laceration on lateral aspect left heel, no erythema, drainage or warmth. Neuro: A&Ox3. Moves all extremities spontaneously. Psych:  Responds to questions appropriately with a normal affect. Dialysis Access: LU AVF +b/t  Dialysis Orders:  TTS - [facility]  5 hrs, BFR 450, DFR 800,  EDW 80kg, 2K/ 2.25Ca  Access: LU AVF  Heparin 4000 Unit Bolus Hectorol IV qHD Venofer 100mg  IV qwk - last 2/19 mircera IV q2wks - last 2/12  Last Labs: 2/28 Hgb8.6, TSAT 28%, K 5.6, Ca 7.3, P 11.2, Albumin 2.9; 2/24: PTH 427  Assessment/Plan: 1. Volume overload - Alveolar edema on CXR. Massive ascites. Appears volume overloaded on exam with decreased BS and LE edema. Paracentesis and extra HD scheduled for today  2. Ascites - To have US guided paracentesis by IR today due to massive ascites.  Last paracentesis on 2/27, they have been scheduled q2wks.   3. ESRD - Extra HD today due to volume overload. K 5.0. Resume regular schedule tomorrow.  4.  Hypotension - BP soft, continue  midodrine. 5.  Anemia  - Hgb 7.6. On ESA as OP but missed last dose, last received on 2/12. Aranesp IV qwk to start tomorrow.  6.  Secondary Hyperparathyroidism -  Cor Ca 9.16, P as outpatient 11.2. Continue binders, VDRA, and sensipar.  7.  Nutrition - Albumin low, 2.3. Nepro, Prostat. Renal diet with fluid restriction.  8. Systolic CHF - per primary 9. H/o A fib - per primary 10. DMT2 - per primary  Virgina Norfolk, PA-C [facility] Pager: [phone] 11/24/2017, 11:41 AM   Pt seen, examined and agree w A/P as above. ESRD pt with progressive severe ascites, symptomatic.  Also some lung edema by CXR, will plan extra HD today and again tomorrow, get vol down if BP's will tolerate. BP's low, on midodrine already.  Prognosis poor. Wanted everything done last August when seen by palliative care.  Vinson Moselle MD [employer] pager [phone]   11/24/2017, 3:44 PM

## 2017-11-24 NOTE — Discharge Summary (Signed)
Physician Discharge Summary  Lindsey Sutton ZOX:[id number] DOB: Dec 26, 1945 DOA: 11/23/2017  PCP: [name], NP  Admit date: 11/23/2017 Discharge date: 11/24/2017  Admitted From: Home Disposition: Home  Recommendations for Outpatient Follow-up:  1. Follow up with PCP in 1 week 2. Please obtain BMP/CBC in one week 3. Please follow up on the following pending results: None  Home Health: PT, OT Equipment/Devices: Wheelchair  Discharge Condition: Stable CODE STATUS: Full code Diet recommendation: Heart healthy/renal diet   Brief/Interim Summary:  Admission HPI written by [name], DO   Chief Complaint: SOB  HPI: Lindsey Sutton is a 72 y.o. female with medical history significant of ESRD on HD TTS, systolic CHF, A.Fib with PPM, DM2, NAFLD requiring biweekly paracentesis.  Patient presents to the ED for evaluation of chest pressure and SOB onset about 330 this afternoon while at dialysis.  Was able to complete dialysis and returned home but daughter concerned about WOB so patient brought to ED.  Per family about 50lbs of wt gain since Sat.  Abd distention and swelling.  Last paracentesis 1 week ago, re accumulating fluid faster than she usually does.  Did miss dialysis on Tues this week.   ED Course: CXR shows some pulm vasc congestion.  BNP 1500.  Trop neg.    Hospital course:  NAFLD Ascites Patient gets frequent paracenteses as an outpatient. Paracentesis performed 3/7 with 15L yield. Albumin given.  Chronic hypotension Worsened slightly after paracentesis. Stable and patient asymptomatic while ambulating. 98/43 while standing after two minutes. Continue midodrine on discharge.  ESRD Slightly fluid up in setting of ascites. Patient missed HD on Tuesday, but received HD on Thursday. Plan was for HD today, however, patient refused.   Diabetes mellitus, type 2 Carb modified diet.   Discharge Diagnoses:  Principal Problem:   Ascites Active Problems:    ESRD (end stage renal disease) (HCC)   DM (diabetes mellitus), type 2 with renal complications (HCC)   NAFLD (nonalcoholic fatty liver disease)    Discharge Instructions  Discharge Instructions    Call MD for:  difficulty breathing, headache or visual disturbances   Complete by:  As directed    Call MD for:  extreme fatigue   Complete by:  As directed    Call MD for:  persistant dizziness or light-headedness   Complete by:  As directed    Diet - low sodium heart healthy   Complete by:  As directed    Increase activity slowly   Complete by:  As directed      Allergies as of 11/24/2017      Reactions   Penicillins Swelling   Has patient had a PCN reaction causing immediate rash, facial/tongue/throat swelling, SOB or lightheadedness with hypotension: Yes Has patient had a PCN reaction causing severe rash involving mucus membranes or skin necrosis: No Has patient had a PCN reaction that required hospitalization No Has patient had a PCN reaction occurring within the last 10 years: No If all of the above answers are "NO", then may proceed with Cephalosporin use.      Medication List    TAKE these medications   albuterol (2.5 MG/3ML) 0.083% nebulizer solution Commonly known as:  PROVENTIL Take 3 mLs (2.5 mg total) by nebulization every 4 (four) hours as needed for wheezing or shortness of breath.   aspirin EC 81 MG tablet Take 81 mg by mouth daily.   budesonide-formoterol 160-4.5 MCG/ACT inhaler Commonly known as:  SYMBICORT Inhale 2 puffs into the lungs 2 (  two) times daily.   Darbepoetin Alfa 100 MCG/0.5ML Sosy injection Commonly known as:  ARANESP Inject 0.5 mLs (100 mcg total) into the vein every Saturday with hemodialysis.   doxercalciferol 4 MCG/2ML injection Commonly known as:  HECTOROL Inject 2 mLs (4 mcg total) into the vein Every Tuesday,Thursday,and Saturday with dialysis.   escitalopram 10 MG tablet Commonly known as:  LEXAPRO Take 1 tablet (10 mg total) by  mouth daily.   feeding supplement (NEPRO CARB STEADY) Liqd Take 237 mLs by mouth daily at 3 pm.   folic acid 1 MG tablet Commonly known as:  FOLVITE Take 1 tablet (1 mg total) by mouth daily.   midodrine 5 MG tablet Commonly known as:  PROAMATINE Take 5 mg by mouth 3 (three) times a week. Take 30 minutes prior to dialysis   multivitamin Tabs tablet Take 1 tablet by mouth at bedtime.   pantoprazole 40 MG tablet Commonly known as:  PROTONIX Take 40 mg by mouth daily.   polyethylene glycol packet Commonly known as:  MIRALAX / GLYCOLAX Take 17 g by mouth daily.   pregabalin 150 MG capsule Commonly known as:  LYRICA Take 150 mg by mouth 2 (two) times daily.   senna-docusate 8.6-50 MG tablet Commonly known as:  Senokot-S Take 1 tablet by mouth 2 (two) times daily.   SENSIPAR 30 MG tablet Generic drug:  cinacalcet Take 30 mg by mouth daily with supper.   sevelamer carbonate 800 MG tablet Commonly known as:  RENVELA Take 4 tablets (3,200 mg total) by mouth 3 (three) times daily with meals.            Durable Medical Equipment  (From admission, onward)        Start     Ordered   11/24/17 1622  For home use only DME standard manual wheelchair with seat cushion  Once    Comments:  Patient suffers from chronic hypotension, cirrhosis which impairs their ability to perform daily activities like bathing in the home.  A cane, crutch or walker will not resolve  issue with performing activities of daily living. A wheelchair will allow patient to safely perform daily activities. Patient can safely propel the wheelchair in the home or has a caregiver who can provide assistance.  Accessories: elevating leg rests (ELRs), wheel locks, extensions and anti-tippers.   11/24/17 1623     Follow-up Information    [facility] Follow [facility].   Specialty:  Home Health Services Why:  home health services arranged Contact information: [address] [place]  [address] [phone]          Allergies  Allergen Reactions  . Penicillins Swelling    Has patient had a PCN reaction causing immediate rash, facial/tongue/throat swelling, SOB or lightheadedness with hypotension: Yes Has patient had a PCN reaction causing severe rash involving mucus membranes or skin necrosis: No Has patient had a PCN reaction that required hospitalization No Has patient had a PCN reaction occurring within the last 10 years: No If all of the above answers are "NO", then may proceed with Cephalosporin use.     Consultations:  Nephrology  Interventional radiology   Procedures/Studies: Dg Chest 2 View  Result Date: 11/23/2017 CLINICAL DATA:  Chest pain EXAM: CHEST - 2 VIEW COMPARISON:  10/05/2017 FINDINGS: Small pleural effusions. Cardiomegaly with vascular congestion and mild diffuse interstitial opacities suggesting edema. Streaky atelectasis at the left lung base. Aortic atherosclerosis. Left-sided pacing device as before. IMPRESSION: Cardiomegaly with vascular congestion,  small pleural effusions, and mild diffuse interstitial opacities suggesting edema. Electronically Signed   By: Jasmine Pang M.D.   On: 11/23/2017 19:38   Dg Foot Complete Left  Result Date: 11/23/2017 CLINICAL DATA:  Wound EXAM: LEFT FOOT - COMPLETE 3+ VIEW COMPARISON:  None. FINDINGS: No definite acute displaced fracture is seen. Osteopenia. Vascular calcifications. Partially visualized surgical hardware in the distal tibia and fibula. Advanced arthropathy at the first through fifth TMT joints with mild inferior positioning of the metatarsals and cuneiform bones with respect to the navicular and talus. Erosions within the medial aspect of the first cuneiform bone. Cyst in the head of the first metatarsal. No periostitis. No definitive bony destruction. IMPRESSION: 1. No definite acute osseous abnormality. 2. 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 Mild inferior  positioning of the cuneiform and metatarsal bones with respect to the talus and navicular. 3. Partially visualized hardware in the distal tibia and fibula. Electronically Signed   By: Jasmine Pang M.D.   On: 11/23/2017 19:43   Ir Paracentesis  Result Date: 11/24/2017 INDICATION: Possible cirrhosis, end-stage renal disease on hemodialysis, recurrent massive ascites. Request is made for therapeutic paracentesis. EXAM: ULTRASOUND GUIDED LEFT LATERAL ABDOMEN PARACENTESIS MEDICATIONS: 1% Lidocaine = 10 mL COMPLICATIONS: None immediate. PROCEDURE: Informed written consent was obtained from the patient after a discussion of the risks, benefits and alternatives to treatment. A timeout was performed prior to the initiation of the procedure. Initial ultrasound scanning demonstrates a large amount of ascites within the left lateral abdomen. The left lateral abdomen was prepped and draped in the usual sterile fashion. 1% lidocaine with epinephrine was used for local anesthesia. Following this, a 19 gauge, 7-cm, Yueh catheter was introduced. An ultrasound image was saved for documentation purposes. The paracentesis was performed. The catheter was removed and a dressing was applied. The patient tolerated the procedure well without immediate post procedural complication. FINDINGS: A total of approximately 15 liters of clear yellow fluid was removed. IMPRESSION: Successful ultrasound-guided paracentesis yielding 15 liters of peritoneal fluid. Read by: Corrin Parker, PA-C Electronically Signed   By: Corlis Leak M.D.   On: 11/24/2017 12:46   Ir Paracentesis  Result Date: 11/15/2017 INDICATION: Patient with history of recurrent ascites. Request is made for therapeutic paracentesis. EXAM: ULTRASOUND GUIDED THERAPEUTIC PARACENTESIS MEDICATIONS: 2% lidocaine COMPLICATIONS: None immediate. PROCEDURE: Informed written consent was obtained from the patient after a discussion of the risks, benefits and alternatives to treatment. A  timeout was performed prior to the initiation of the procedure. Initial ultrasound scanning demonstrates a large amount of ascites within the left lateral abdomen. The left abdomen was prepped and draped in the usual sterile fashion. 2% lidocaine was used for local anesthesia. Following this, a 19 gauge, 7-cm, Yueh catheter was introduced. An ultrasound image was saved for documentation purposes. The paracentesis was performed. The catheter was removed and a dressing was applied. The patient tolerated the procedure well without immediate post procedural complication. FINDINGS: A total of approximately 13.0 liters of clear, yellow fluid was removed. IMPRESSION: Successful ultrasound-guided therapeutic paracentesis yielding 13.0 liters of peritoneal fluid. Patient received albumin with more recent large volume paracentesis and requests again today. Will proceed with administering 50g 25% albumin (6g/liter after 5 liters removed). Read by: Loyce Dys PA-C Electronically Signed   By: Jolaine Click M.D.   On: 11/15/2017 14:41   Ir Paracentesis  Result Date: 10/27/2017 INDICATION: Possible cirrhosis, end-stage renal disease on hemodialysis, recurrent massive ascites. Request is made for  therapeutic paracentesis. EXAM: ULTRASOUND GUIDED THERAPEUTIC PARACENTESIS MEDICATIONS: 2% lidocaine COMPLICATIONS: None immediate. PROCEDURE: Informed written consent was obtained from the patient after a discussion of the risks, benefits and alternatives to treatment. A timeout was performed prior to the initiation of the procedure. Initial ultrasound scanning demonstrates a large amount of ascites within the left lower abdominal quadrant. The left lower abdomen was prepped and draped in the usual sterile fashion. 2% lidocaine was used for local anesthesia. Following this, a 19 gauge, 7-cm, Yueh catheter was introduced. An ultrasound image was saved for documentation purposes. The paracentesis was performed. The catheter was  removed and a dressing was applied. The patient tolerated the procedure well without immediate post procedural complication. FINDINGS: A total of approximately 13 L of serous fluid was removed. IMPRESSION: Successful ultrasound-guided paracentesis yielding 13 liters of peritoneal fluid. Given her largest prior total was 10 L, the determination was made to stop in a stepwise fashion at 13 L today. Because of this large volume shift even though it was not ordered, we will give 25 g per 3 L of albumin for a total of 100 g. The patient and her daughter were counseled on coming in more frequently as to not have such large volume paracenteses done. This way we can hopefully insure draining her dry as opposed to having to leave a large amount of ascites. Read by: Barnetta ChapelKelly Osborne, PA-C Electronically Signed   By: Malachy MoanHeath  McCullough M.D.   On: 10/27/2017 15:54     Subjective: No concerns. Breathing much better now.  Discharge Exam: Vitals:   11/24/17 1209 11/24/17 1319  BP: (!) 87/52 (!) 86/44  Pulse:  60  Resp:  18  Temp:  97.7 F (36.5 C)  SpO2:  99%   Vitals:   11/24/17 1016 11/24/17 1100 11/24/17 1209 11/24/17 1319  BP: 105/71 (!) 84/32 (!) 87/52 (!) 86/44  Pulse:    60  Resp:    18  Temp:    97.7 F (36.5 C)  TempSrc:    Oral  SpO2:    99%  Weight:      Height:        General: Pt is alert, awake, not in acute distress Cardiovascular: RRR, S1/S2 +, no rubs, no gallops Respiratory: diminished, no wheezing, no rhonchi Abdominal: Soft, NT, distended, bowel sounds +   The results of significant diagnostics from this hospitalization (including imaging, microbiology, ancillary and laboratory) are listed below for reference.     Microbiology: Recent Results (from the past 240 hour(s))  MRSA PCR Screening     Status: None   Collection Time: 11/24/17  5:34 AM  Result Value Ref Range Status   MRSA by PCR NEGATIVE NEGATIVE Final    Comment:        The GeneXpert MRSA Assay (FDA approved  for NASAL specimens only), is one component of a comprehensive MRSA colonization surveillance program. It is not intended to diagnose MRSA infection nor to guide or monitor treatment for MRSA infections. Performed at [facility], [address] [place], [place] [id number]      Labs: BNP (last 3 results) Recent Labs    11/23/17 1916  BNP 1,539.3*   Basic Metabolic Panel: Recent Labs  Lab 11/23/17 1839 11/24/17 0441  NA 133* 132*  K 4.4 5.0  CL 93* 93*  CO2 26 26  GLUCOSE 89 83  BUN 35* 41*  CREATININE 4.10* 4.64*  CALCIUM 8.0* 7.8*   Liver Function Tests: Recent Labs  Lab 11/23/17 1839  AST 13*  ALT 9*  ALKPHOS 69  BILITOT 0.9  PROT 5.2*  ALBUMIN 2.3*   No results for input(s): LIPASE, AMYLASE in the last 168 hours. No results for input(s): AMMONIA in the last 168 hours. CBC: Recent Labs  Lab 11/23/17 1839 11/24/17 0441  WBC 3.6* 3.8*  HGB 8.0* 7.6*  HCT 25.3* 23.8*  MCV 94.1 94.8  PLT 178 173   Cardiac Enzymes: No results for input(s): CKTOTAL, CKMB, CKMBINDEX, TROPONINI in the last 168 hours. BNP: Invalid input(s): POCBNP CBG: No results for input(s): GLUCAP in the last 168 hours. D-Dimer No results for input(s): DDIMER in the last 72 hours. Hgb A1c No results for input(s): HGBA1C in the last 72 hours. Lipid Profile No results for input(s): CHOL, HDL, LDLCALC, TRIG, CHOLHDL, LDLDIRECT in the last 72 hours. Thyroid function studies No results for input(s): TSH, T4TOTAL, T3FREE, THYROIDAB in the last 72 hours.  Invalid input(s): FREET3 Anemia work up No results for input(s): VITAMINB12, FOLATE, FERRITIN, TIBC, IRON, RETICCTPCT in the last 72 hours. Urinalysis    Component Value Date/Time   COLORURINE AMBER (A) 01/25/2017 2240   APPEARANCEUR CLOUDY (A) 01/25/2017 2240   LABSPEC 1.014 01/25/2017 2240   PHURINE 6.0 01/25/2017 2240   GLUCOSEU NEGATIVE 01/25/2017 2240   HGBUR MODERATE (A) 01/25/2017 2240   BILIRUBINUR NEGATIVE  01/25/2017 2240   KETONESUR 5 (A) 01/25/2017 2240   PROTEINUR 100 (A) 01/25/2017 2240   NITRITE NEGATIVE 01/25/2017 2240   LEUKOCYTESUR SMALL (A) 01/25/2017 2240   Sepsis Labs Invalid input(s): PROCALCITONIN,  WBC,  LACTICIDVEN Microbiology Recent Results (from the past 240 hour(s))  MRSA PCR Screening     Status: None   Collection Time: 11/24/17  5:34 AM  Result Value Ref Range Status   MRSA by PCR NEGATIVE NEGATIVE Final    Comment:        The GeneXpert MRSA Assay (FDA approved for NASAL specimens only), is one component of a comprehensive MRSA colonization surveillance program. It is not intended to diagnose MRSA infection nor to guide or monitor treatment for MRSA infections. Performed at [facility], [address] [place], [place] [address]      Time coordinating discharge: Over 30 minutes  SIGNED:   Jacquelin Hawking, MD [facility] 11/24/2017, 4:30 PM Pager [phone]  If 7PM-7AM, please contact night-coverage [email or web address] Password [email or web address]

## 2017-12-08 ENCOUNTER — Other Ambulatory Visit (HOSPITAL_COMMUNITY): Payer: Self-pay | Admitting: Nephrology

## 2017-12-08 DIAGNOSIS — R188 Other ascites: Secondary | ICD-10-CM

## 2017-12-08 DIAGNOSIS — K7460 Unspecified cirrhosis of liver: Secondary | ICD-10-CM

## 2017-12-15 ENCOUNTER — Ambulatory Visit (HOSPITAL_COMMUNITY)
Admission: RE | Admit: 2017-12-15 | Discharge: 2017-12-15 | Disposition: A | Discharge status: Home / Self Care | Payer: Medicare Other | Source: Ambulatory Visit | Attending: Nephrology | Admitting: Nephrology

## 2017-12-15 ENCOUNTER — Encounter (HOSPITAL_COMMUNITY): Payer: Self-pay | Admitting: Student

## 2017-12-15 DIAGNOSIS — K7460 Unspecified cirrhosis of liver: Secondary | ICD-10-CM

## 2017-12-15 DIAGNOSIS — R188 Other ascites: Secondary | ICD-10-CM | POA: Insufficient documentation

## 2017-12-15 HISTORY — PX: IR PARACENTESIS: IMG2679

## 2017-12-15 MED ORDER — LIDOCAINE HCL (PF) 2 % IJ SOLN
INTRAMUSCULAR | Status: DC | PRN
  Administered 2017-12-15: 10 mL

## 2017-12-15 MED ORDER — LIDOCAINE HCL (PF) 2 % IJ SOLN
INTRAMUSCULAR | Status: DC
Start: 2017-12-15 — End: 2017-12-16
  Filled 2017-12-15: qty 20

## 2017-12-15 NOTE — Procedures (Signed)
PROCEDURE SUMMARY:  Successful US guided paracentesis from right lateral abdomen.  Yielded 12 liters of yellow fluid.  No immediate complications.  Pt tolerated well.   Specimen was not sent for labs.  Hoyt KochKacie Sue-Ellen Nyesha Cliff PA-C 12/15/2017 1:57 PM

## 2017-12-28 ENCOUNTER — Emergency Department (HOSPITAL_COMMUNITY): Payer: Medicare Other

## 2017-12-28 ENCOUNTER — Encounter (HOSPITAL_COMMUNITY): Payer: Self-pay | Admitting: *Deleted

## 2017-12-28 ENCOUNTER — Emergency Department (HOSPITAL_COMMUNITY)
Admission: EM | Admit: 2017-12-28 | Discharge: 2017-12-28 | Disposition: A | Discharge status: Home / Self Care | Payer: Medicare Other | Attending: Emergency Medicine | Admitting: Emergency Medicine

## 2017-12-28 ENCOUNTER — Other Ambulatory Visit: Payer: Self-pay

## 2017-12-28 DIAGNOSIS — Z992 Dependence on renal dialysis: Secondary | ICD-10-CM | POA: Insufficient documentation

## 2017-12-28 DIAGNOSIS — Z87891 Personal history of nicotine dependence: Secondary | ICD-10-CM | POA: Insufficient documentation

## 2017-12-28 DIAGNOSIS — I132 Hypertensive heart and chronic kidney disease with heart failure and with stage 5 chronic kidney disease, or end stage renal disease: Secondary | ICD-10-CM | POA: Insufficient documentation

## 2017-12-28 DIAGNOSIS — E1122 Type 2 diabetes mellitus with diabetic chronic kidney disease: Secondary | ICD-10-CM | POA: Insufficient documentation

## 2017-12-28 DIAGNOSIS — J449 Chronic obstructive pulmonary disease, unspecified: Secondary | ICD-10-CM | POA: Insufficient documentation

## 2017-12-28 DIAGNOSIS — Z7982 Long term (current) use of aspirin: Secondary | ICD-10-CM | POA: Diagnosis not present

## 2017-12-28 DIAGNOSIS — I4891 Unspecified atrial fibrillation: Secondary | ICD-10-CM | POA: Insufficient documentation

## 2017-12-28 DIAGNOSIS — X102XXA Contact with fats and cooking oils, initial encounter: Secondary | ICD-10-CM | POA: Diagnosis not present

## 2017-12-28 DIAGNOSIS — Y929 Unspecified place or not applicable: Secondary | ICD-10-CM | POA: Diagnosis not present

## 2017-12-28 DIAGNOSIS — Y93G3 Activity, cooking and baking: Secondary | ICD-10-CM | POA: Insufficient documentation

## 2017-12-28 DIAGNOSIS — T25232A Burn of second degree of left toe(s) (nail), initial encounter: Secondary | ICD-10-CM | POA: Diagnosis present

## 2017-12-28 DIAGNOSIS — Y998 Other external cause status: Secondary | ICD-10-CM | POA: Insufficient documentation

## 2017-12-28 DIAGNOSIS — N186 End stage renal disease: Secondary | ICD-10-CM | POA: Insufficient documentation

## 2017-12-28 DIAGNOSIS — I5022 Chronic systolic (congestive) heart failure: Secondary | ICD-10-CM | POA: Diagnosis not present

## 2017-12-28 DIAGNOSIS — Z950 Presence of cardiac pacemaker: Secondary | ICD-10-CM | POA: Diagnosis not present

## 2017-12-28 DIAGNOSIS — Z79899 Other long term (current) drug therapy: Secondary | ICD-10-CM | POA: Insufficient documentation

## 2017-12-28 DIAGNOSIS — T25222A Burn of second degree of left foot, initial encounter: Secondary | ICD-10-CM

## 2017-12-28 LAB — CBC WITH DIFFERENTIAL/PLATELET
BASOS ABS: 0 10*3/uL (ref 0.0–0.1)
Basophils Relative: 0 %
EOS PCT: 4 %
Eosinophils Absolute: 0.2 10*3/uL (ref 0.0–0.7)
HCT: 27.4 % — ABNORMAL LOW (ref 36.0–46.0)
Hemoglobin: 9 g/dL — ABNORMAL LOW (ref 12.0–15.0)
Lymphocytes Relative: 17 %
Lymphs Abs: 0.7 10*3/uL (ref 0.7–4.0)
MCH: 30.7 pg (ref 26.0–34.0)
MCHC: 32.8 g/dL (ref 30.0–36.0)
MCV: 93.5 fL (ref 78.0–100.0)
MONO ABS: 0.2 10*3/uL (ref 0.1–1.0)
Monocytes Relative: 5 %
Neutro Abs: 2.9 10*3/uL (ref 1.7–7.7)
Neutrophils Relative %: 74 %
PLATELETS: 144 10*3/uL — AB (ref 150–400)
RBC: 2.93 MIL/uL — ABNORMAL LOW (ref 3.87–5.11)
RDW: 15.7 % — AB (ref 11.5–15.5)
WBC: 3.9 10*3/uL — ABNORMAL LOW (ref 4.0–10.5)

## 2017-12-28 LAB — COMPREHENSIVE METABOLIC PANEL
ALBUMIN: 1.9 g/dL — AB (ref 3.5–5.0)
ALK PHOS: 74 U/L (ref 38–126)
ALT: 8 U/L — AB (ref 14–54)
AST: 11 U/L — AB (ref 15–41)
Anion gap: 16 — ABNORMAL HIGH (ref 5–15)
BILIRUBIN TOTAL: 0.5 mg/dL (ref 0.3–1.2)
BUN: 49 mg/dL — AB (ref 6–20)
CALCIUM: 6.9 mg/dL — AB (ref 8.9–10.3)
CO2: 21 mmol/L — ABNORMAL LOW (ref 22–32)
CREATININE: 6.05 mg/dL — AB (ref 0.44–1.00)
Chloride: 94 mmol/L — ABNORMAL LOW (ref 101–111)
GFR calc Af Amer: 7 mL/min — ABNORMAL LOW (ref >60)
GFR calc non Af Amer: 6 mL/min — ABNORMAL LOW (ref >60)
GLUCOSE: 73 mg/dL (ref 65–99)
Potassium: 4.9 mmol/L (ref 3.5–5.1)
Sodium: 131 mmol/L — ABNORMAL LOW (ref 135–145)
TOTAL PROTEIN: 5.1 g/dL — AB (ref 6.5–8.1)

## 2017-12-28 LAB — I-STAT CG4 LACTIC ACID, ED: Lactic Acid, Venous: 1.25 mmol/L (ref 0.5–1.9)

## 2017-12-28 MED ORDER — CEPHALEXIN 250 MG PO CAPS: 500.0000 mg | ORAL_CAPSULE | Freq: Two times a day (BID) | ORAL | 0 refills | Status: AC

## 2017-12-28 NOTE — ED Notes (Signed)
Bacitracin in was applied to toes with gauze wrapping.
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
## 2017-12-28 NOTE — Discharge Instructions (Addendum)
Need to clean the burn with soap and water.  Need to apply neosporin daily and change bandage daily.  Elevate the foot.
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
## 2017-12-28 NOTE — ED Provider Notes (Signed)
[facility] EMERGENCY DEPARTMENT Provider Note   CSN: [id number] Arrival date & time: 12/28/17  1542     History   Chief Complaint Chief Complaint  Patient presents with  . Burn    HPI Lindsey Sutton is a 72 y.o. female.  Patient is a 72 year old female with a history of end-stage renal disease on dialysis, COPD, atrial fibrillation, diabetes who is presenting today after a burn to her foot.  Patient states 6 days prior to arrival she was cooking bacon and dropped the grease on her left big toe and second toe.  Family states that it has blistered and today it popped open and started bleeding.  Patient denies any systemic symptoms such as fever, malaise or not feeling well.  She had a full course of dialysis today.  She has been putting Neosporin on it at home and wrapping it.  Patient states she does not have any pain in that foot because she does not have distal sensation related to neuropathy.  The history is provided by the patient.    Past Medical History:  Diagnosis Date  . A-fib (HCC)   . Anxiety   . Arthritis    "legs" (04/20/2017)  . COPD (chronic obstructive pulmonary disease) (HCC)   . Depression   . ESRD (end stage renal disease) on dialysis (HCC)    "TTS; [facility]; [place]" (04/20/2017)  . Heart murmur   . History of blood transfusion    related to childbirth  . Iron deficiency anemia   . NAFLD (nonalcoholic fatty liver disease) 4/0/9811  . Obesity (BMI 30-39.9) 01/20/2017  . Pacemaker   . Pulmonary edema   . Secondary hyperparathyroidism of renal origin (HCC) 01/22/2017  . Systolic heart failure (HCC)   . Thrombocytopenia (HCC) 01/21/2017  . Type II diabetes mellitus [facility])     Patient Active Problem List   Diagnosis Date Noted  . Acute respiratory distress 10/06/2017  . Palliative care by specialist   . Acute metabolic encephalopathy 04/20/2017  . Gait instability 04/20/2017  . Frequent falls 04/20/2017  . Anemia in chronic kidney disease  (CKD) 04/20/2017  . Pancytopenia (HCC) 01/23/2017  . Secondary hyperparathyroidism of renal origin (HCC) 01/22/2017  . Hyperphosphatemia 01/22/2017  . Aortic stenosis, moderate-severe 01/22/2017  . Essential hypertension 01/21/2017  . Hyponatremia 01/21/2017  . NAFLD (nonalcoholic fatty liver disease) 91/47/8295  . Thrombocytopenia (HCC) 01/21/2017  . Obesity (BMI 30-39.9) 01/20/2017  . Hip fx (HCC) 01/19/2017  . A-fib (HCC) 01/19/2017  . ESRD (end stage renal disease) (HCC) 01/19/2017  . Pacemaker   . Iron deficiency anemia   . Systolic heart failure (HCC)   . Ascites   . DM (diabetes mellitus), type 2 with renal complications (HCC)   . Nondisplaced intertrochanteric fracture of left femur, initial encounter for closed fracture [facility])     Past Surgical History:  Procedure Laterality Date  . AV FISTULA PLACEMENT Left ~ 2003  . AV FISTULA REPAIR Left X 2  . CARDIAC CATHETERIZATION    . CATARACT EXTRACTION W/ INTRAOCULAR LENS  IMPLANT, BILATERAL Bilateral 2017  . FRACTURE SURGERY    . INSERT / REPLACE / REMOVE PACEMAKER  ~ 2012  . INTRAMEDULLARY (IM) NAIL INTERTROCHANTERIC Left 01/20/2017   Procedure: INTRAMEDULLARY (IM) NAIL INTERTROCHANTRIC;  Surgeon: Tarry Kos, MD;  Location: [facility] OR;  Service: Orthopedics;  Laterality: Left;  . IR PARACENTESIS  10/06/2017  . IR PARACENTESIS  10/27/2017  . IR PARACENTESIS  11/15/2017  . IR PARACENTESIS  11/24/2017  .  IR PARACENTESIS  12/15/2017     OB History   None      Home Medications    Prior to Admission medications   Medication Sig Start Date End Date Taking? Authorizing Provider  albuterol (PROVENTIL) (2.5 MG/3ML) 0.083% nebulizer solution Take 3 mLs (2.5 mg total) by nebulization every 4 (four) hours as needed for wheezing or shortness of breath. Patient not taking: Reported on 11/23/2017 04/25/17   Cleora Fleet, MD  aspirin EC 81 MG tablet Take 81 mg by mouth daily.    [provider]  budesonide-formoterol (SYMBICORT)  160-4.5 MCG/ACT inhaler Inhale 2 puffs into the lungs 2 (two) times daily.    [provider]  cinacalcet (SENSIPAR) 30 MG tablet Take 30 mg by mouth daily with supper.    [provider]  Darbepoetin Alfa (ARANESP) 100 MCG/0.5ML SOSY injection Inject 0.5 mLs (100 mcg total) into the vein every Saturday with hemodialysis. 10/14/17   Barnetta Chapel, MD  doxercalciferol (HECTOROL) 4 MCG/2ML injection Inject 2 mLs (4 mcg total) into the vein Every Tuesday,Thursday,and Saturday with dialysis. 10/10/17   Barnetta Chapel, MD  escitalopram (LEXAPRO) 10 MG tablet Take 1 tablet (10 mg total) by mouth daily. 04/25/17   Johnson, Clanford L, MD  folic acid (FOLVITE) 1 MG tablet Take 1 tablet (1 mg total) by mouth daily. Patient not taking: Reported on 11/23/2017 04/25/17   Standley Dakins L, MD  midodrine (PROAMATINE) 5 MG tablet Take 5 mg by mouth 3 (three) times a week. Take 30 minutes prior to dialysis    [provider]  multivitamin (RENA-VIT) TABS tablet Take 1 tablet by mouth at bedtime. 10/10/17   Barnetta Chapel, MD  Nutritional Supplements (FEEDING SUPPLEMENT, NEPRO CARB STEADY,) LIQD Take 237 mLs by mouth daily at 3 pm. Patient not taking: Reported on 10/06/2017 04/25/17   Cleora Fleet, MD  pantoprazole (PROTONIX) 40 MG tablet Take 40 mg by mouth daily.    [provider]  polyethylene glycol (MIRALAX / GLYCOLAX) packet Take 17 g by mouth daily. Patient not taking: Reported on 11/23/2017 04/25/17   Cleora Fleet, MD  pregabalin (LYRICA) 150 MG capsule Take 150 mg by mouth 2 (two) times daily.     [provider]  senna-docusate (SENOKOT-S) 8.6-50 MG tablet Take 1 tablet by mouth 2 (two) times daily. Patient not taking: Reported on 11/23/2017 01/26/17   Rai, Delene Ruffini, MD  sevelamer carbonate (RENVELA) 800 MG tablet Take 4 tablets (3,200 mg total) by mouth 3 (three) times daily with meals. 10/10/17   Barnetta Chapel, MD    Family  History Family History  Problem Relation Age of Onset  . Diabetes Mother   . Hypertension Father     Social History Social History   Tobacco Use  . Smoking status: Former Smoker    Packs/day: 1.00    Years: 33.00    Pack years: 33.00    Types: Cigarettes    Last attempt to quit: 09/23/1996    Years since quitting: 21.2  . Smokeless tobacco: Never Used  Substance Use Topics  . Alcohol use: No    Comment: 04/20/2017 "nothing in the 2000s"  . Drug use: No     Allergies   Penicillins   Review of Systems Review of Systems  Constitutional: Negative for fatigue and fever.  Respiratory: Negative for cough and shortness of breath.   All other systems reviewed and are negative.    Physical Exam Updated Vital  Signs BP 118/71 (BP Location: Right Arm)   Pulse 75   Temp (!) 97.4 F (36.3 C) (Oral)   Resp 18   SpO2 100%   Physical Exam  Constitutional: She is oriented to person, place, and time. She appears well-developed and well-nourished. No distress.  HENT:  Head: Normocephalic and atraumatic.  Mouth/Throat: Oropharynx is clear and moist.  Eyes: Pupils are equal, round, and reactive to light. Conjunctivae and EOM are normal.  Neck: Normal range of motion. Neck supple.  Cardiovascular: Normal rate, regular rhythm and intact distal pulses.  Murmur heard. Pulmonary/Chest: Effort normal and breath sounds normal. No respiratory distress. She has no wheezes. She has no rales.  Abdominal: Soft. She exhibits distension. There is no tenderness. There is no rebound and no guarding.  Ascites present that is nontender  Musculoskeletal: Normal range of motion. She exhibits no edema or tenderness.       Feet:  Neurological: She is alert and oriented to person, place, and time.  Skin: Skin is warm and dry. No rash noted. No erythema.  Psychiatric: She has a normal mood and affect. Her behavior is normal.  Nursing note and vitals reviewed.    ED Treatments / Results  Labs (all  labs ordered are listed, but only abnormal results are displayed) Labs Reviewed  COMPREHENSIVE METABOLIC PANEL - Abnormal; Notable for the following components:      Result Value   Sodium 131 (*)    Chloride 94 (*)    CO2 21 (*)    BUN 49 (*)    Creatinine, Ser 6.05 (*)    Calcium 6.9 (*)    Total Protein 5.1 (*)    Albumin 1.9 (*)    AST 11 (*)    ALT 8 (*)    GFR calc non Af Amer 6 (*)    GFR calc Af Amer 7 (*)    Anion gap 16 (*)    All other components within normal limits  CBC WITH DIFFERENTIAL/PLATELET - Abnormal; Notable for the following components:   WBC 3.9 (*)    RBC 2.93 (*)    Hemoglobin 9.0 (*)    HCT 27.4 (*)    RDW 15.7 (*)    Platelets 144 (*)    All other components within normal limits  URINALYSIS, ROUTINE W REFLEX MICROSCOPIC  I-STAT CG4 LACTIC ACID, ED  I-STAT CG4 LACTIC ACID, ED    EKG None  Radiology Dg Foot Complete Left  Result Date: 12/28/2017 CLINICAL DATA:  Burn to the dorsal surface of the foot EXAM: LEFT FOOT - COMPLETE 3+ VIEW COMPARISON:  11/23/2017 FINDINGS: No acute fracture or malalignment. Extensive vascular calcification. Partially visible hardware within the medial malleolar region of the tibia with surgical plate and multiple screws in the distal fibula. Marked arthritic changes at the TMT joints with possible old fracture deformities at the bases of the second and third metatarsals. Inferior positioning of the cuneiform bones with respect to the navicular. This finding does not show significant change compared to prior radiograph. IMPRESSION: 1. No acute osseous abnormality. 2. Chronic appearing arthritis and deformity at the midfoot and TMT joints. Electronically Signed   By: Jasmine Pang M.D.   On: 12/28/2017 17:57    Procedures Procedures (including critical care time)  Medications Ordered in ED Medications - No data to display   Initial Impression / Assessment and Plan / ED Course  I have reviewed the triage vital signs and  the nursing notes.  Pertinent labs &  imaging results that were available during my care of the patient were reviewed by me and considered in my medical decision making (see chart for details).     Elderly female with multiple medical problems presenting today with a 59-day-old burn to her foot.  Patient is not systemically ill and has no signs of cellulitis at this time.  Patient's labs are reassuring with a normal lactate and normal white count which is not changed from her baseline.  Patient will receive localized wound care and recommended follow-up with wound care as well as home health doing dressing changes daily.  Patient was also started on prophylactic antibiotic.  Told to use Neosporin to the burn areas.  But will need wound care follow-up as an outpatient.  Low suspicion for osteo-at this time.  Final Clinical Impressions(s) / ED Diagnoses   Final diagnoses:  Partial thickness burn of left foot, initial encounter    ED Discharge Orders        Ordered    cephALEXin (KEFLEX) 250 MG capsule  2 times daily     12/28/17 Alden Hipp, MD 12/28/17 2009

## 2018-01-01 ENCOUNTER — Emergency Department (HOSPITAL_COMMUNITY): Payer: Medicare Other

## 2018-01-01 ENCOUNTER — Encounter (HOSPITAL_COMMUNITY): Payer: Self-pay | Admitting: *Deleted

## 2018-01-01 ENCOUNTER — Other Ambulatory Visit: Payer: Self-pay

## 2018-01-01 ENCOUNTER — Inpatient Hospital Stay (HOSPITAL_COMMUNITY)
Admission: EM | Admit: 2018-01-01 | Discharge: 2018-01-17 | DRG: 871 | Disposition: E | Payer: Medicare Other | Attending: Family Medicine | Admitting: Family Medicine

## 2018-01-01 DIAGNOSIS — Z9842 Cataract extraction status, left eye: Secondary | ICD-10-CM

## 2018-01-01 DIAGNOSIS — E875 Hyperkalemia: Secondary | ICD-10-CM

## 2018-01-01 DIAGNOSIS — Z9841 Cataract extraction status, right eye: Secondary | ICD-10-CM

## 2018-01-01 DIAGNOSIS — R011 Cardiac murmur, unspecified: Secondary | ICD-10-CM | POA: Diagnosis present

## 2018-01-01 DIAGNOSIS — R188 Other ascites: Secondary | ICD-10-CM | POA: Diagnosis present

## 2018-01-01 DIAGNOSIS — Z7951 Long term (current) use of inhaled steroids: Secondary | ICD-10-CM

## 2018-01-01 DIAGNOSIS — R531 Weakness: Secondary | ICD-10-CM

## 2018-01-01 DIAGNOSIS — Z79899 Other long term (current) drug therapy: Secondary | ICD-10-CM

## 2018-01-01 DIAGNOSIS — N049 Nephrotic syndrome with unspecified morphologic changes: Secondary | ICD-10-CM | POA: Diagnosis not present

## 2018-01-01 DIAGNOSIS — I4891 Unspecified atrial fibrillation: Secondary | ICD-10-CM | POA: Diagnosis present

## 2018-01-01 DIAGNOSIS — Z992 Dependence on renal dialysis: Secondary | ICD-10-CM

## 2018-01-01 DIAGNOSIS — Z9115 Patient's noncompliance with renal dialysis: Secondary | ICD-10-CM | POA: Diagnosis not present

## 2018-01-01 DIAGNOSIS — T25022A Burn of unspecified degree of left foot, initial encounter: Secondary | ICD-10-CM | POA: Diagnosis present

## 2018-01-01 DIAGNOSIS — X12XXXA Contact with other hot fluids, initial encounter: Secondary | ICD-10-CM | POA: Diagnosis present

## 2018-01-01 DIAGNOSIS — N186 End stage renal disease: Secondary | ICD-10-CM | POA: Diagnosis present

## 2018-01-01 DIAGNOSIS — I5022 Chronic systolic (congestive) heart failure: Secondary | ICD-10-CM | POA: Diagnosis present

## 2018-01-01 DIAGNOSIS — J449 Chronic obstructive pulmonary disease, unspecified: Secondary | ICD-10-CM | POA: Diagnosis present

## 2018-01-01 DIAGNOSIS — E1121 Type 2 diabetes mellitus with diabetic nephropathy: Secondary | ICD-10-CM | POA: Diagnosis present

## 2018-01-01 DIAGNOSIS — M1991 Primary osteoarthritis, unspecified site: Secondary | ICD-10-CM | POA: Diagnosis present

## 2018-01-01 DIAGNOSIS — K760 Fatty (change of) liver, not elsewhere classified: Secondary | ICD-10-CM | POA: Diagnosis present

## 2018-01-01 DIAGNOSIS — A419 Sepsis, unspecified organism: Secondary | ICD-10-CM

## 2018-01-01 DIAGNOSIS — Z950 Presence of cardiac pacemaker: Secondary | ICD-10-CM

## 2018-01-01 DIAGNOSIS — N19 Unspecified kidney failure: Secondary | ICD-10-CM

## 2018-01-01 DIAGNOSIS — R140 Abdominal distension (gaseous): Secondary | ICD-10-CM | POA: Diagnosis present

## 2018-01-01 DIAGNOSIS — Z7982 Long term (current) use of aspirin: Secondary | ICD-10-CM

## 2018-01-01 DIAGNOSIS — K3184 Gastroparesis: Secondary | ICD-10-CM | POA: Diagnosis present

## 2018-01-01 DIAGNOSIS — I132 Hypertensive heart and chronic kidney disease with heart failure and with stage 5 chronic kidney disease, or end stage renal disease: Secondary | ICD-10-CM | POA: Diagnosis present

## 2018-01-01 DIAGNOSIS — K7460 Unspecified cirrhosis of liver: Secondary | ICD-10-CM | POA: Diagnosis present

## 2018-01-01 DIAGNOSIS — Z961 Presence of intraocular lens: Secondary | ICD-10-CM | POA: Diagnosis present

## 2018-01-01 DIAGNOSIS — Z6834 Body mass index (BMI) 34.0-34.9, adult: Secondary | ICD-10-CM

## 2018-01-01 DIAGNOSIS — F329 Major depressive disorder, single episode, unspecified: Secondary | ICD-10-CM | POA: Diagnosis present

## 2018-01-01 DIAGNOSIS — I469 Cardiac arrest, cause unspecified: Secondary | ICD-10-CM | POA: Diagnosis not present

## 2018-01-01 DIAGNOSIS — F419 Anxiety disorder, unspecified: Secondary | ICD-10-CM | POA: Diagnosis present

## 2018-01-01 DIAGNOSIS — E1122 Type 2 diabetes mellitus with diabetic chronic kidney disease: Secondary | ICD-10-CM | POA: Diagnosis present

## 2018-01-01 DIAGNOSIS — Z87891 Personal history of nicotine dependence: Secondary | ICD-10-CM

## 2018-01-01 DIAGNOSIS — D509 Iron deficiency anemia, unspecified: Secondary | ICD-10-CM | POA: Diagnosis present

## 2018-01-01 DIAGNOSIS — Z833 Family history of diabetes mellitus: Secondary | ICD-10-CM

## 2018-01-01 DIAGNOSIS — I468 Cardiac arrest due to other underlying condition: Secondary | ICD-10-CM

## 2018-01-01 DIAGNOSIS — R0602 Shortness of breath: Secondary | ICD-10-CM

## 2018-01-01 DIAGNOSIS — D631 Anemia in chronic kidney disease: Secondary | ICD-10-CM | POA: Diagnosis present

## 2018-01-01 DIAGNOSIS — I350 Nonrheumatic aortic (valve) stenosis: Secondary | ICD-10-CM | POA: Diagnosis present

## 2018-01-01 DIAGNOSIS — N2581 Secondary hyperparathyroidism of renal origin: Secondary | ICD-10-CM | POA: Diagnosis present

## 2018-01-01 DIAGNOSIS — E669 Obesity, unspecified: Secondary | ICD-10-CM | POA: Diagnosis present

## 2018-01-01 DIAGNOSIS — T25022D Burn of unspecified degree of left foot, subsequent encounter: Secondary | ICD-10-CM

## 2018-01-01 DIAGNOSIS — E1143 Type 2 diabetes mellitus with diabetic autonomic (poly)neuropathy: Secondary | ICD-10-CM | POA: Diagnosis present

## 2018-01-01 DIAGNOSIS — Z880 Allergy status to penicillin: Secondary | ICD-10-CM

## 2018-01-01 DIAGNOSIS — Z8249 Family history of ischemic heart disease and other diseases of the circulatory system: Secondary | ICD-10-CM

## 2018-01-01 LAB — COMPREHENSIVE METABOLIC PANEL
ALT: 9 U/L — ABNORMAL LOW (ref 14–54)
AST: 14 U/L — ABNORMAL LOW (ref 15–41)
Albumin: 2.1 g/dL — ABNORMAL LOW (ref 3.5–5.0)
Alkaline Phosphatase: 84 U/L (ref 38–126)
Anion gap: 27 — ABNORMAL HIGH (ref 5–15)
BUN: 76 mg/dL — ABNORMAL HIGH (ref 6–20)
CO2: 10 mmol/L — ABNORMAL LOW (ref 22–32)
Calcium: 6 mg/dL — CL (ref 8.9–10.3)
Chloride: 87 mmol/L — ABNORMAL LOW (ref 101–111)
Creatinine, Ser: 8.38 mg/dL — ABNORMAL HIGH (ref 0.44–1.00)
GFR calc Af Amer: 5 mL/min — ABNORMAL LOW (ref >60)
GFR calc non Af Amer: 4 mL/min — ABNORMAL LOW (ref >60)
Glucose, Bld: 171 mg/dL — ABNORMAL HIGH (ref 65–99)
Potassium: 6.6 mmol/L (ref 3.5–5.1)
Sodium: 124 mmol/L — ABNORMAL LOW (ref 135–145)
Total Bilirubin: 0.9 mg/dL (ref 0.3–1.2)
Total Protein: 5.3 g/dL — ABNORMAL LOW (ref 6.5–8.1)

## 2018-01-01 LAB — I-STAT CHEM 8, ED
BUN: 73 mg/dL — ABNORMAL HIGH (ref 6–20)
Calcium, Ion: 0.66 mmol/L — CL (ref 1.15–1.40)
Chloride: 94 mmol/L — ABNORMAL LOW (ref 101–111)
Creatinine, Ser: 8 mg/dL — ABNORMAL HIGH (ref 0.44–1.00)
Glucose, Bld: 170 mg/dL — ABNORMAL HIGH (ref 65–99)
HCT: 35 % — ABNORMAL LOW (ref 36.0–46.0)
Hemoglobin: 11.9 g/dL — ABNORMAL LOW (ref 12.0–15.0)
Potassium: 6.3 mmol/L (ref 3.5–5.1)
Sodium: 121 mmol/L — ABNORMAL LOW (ref 135–145)
TCO2: 14 mmol/L — ABNORMAL LOW (ref 22–32)

## 2018-01-01 LAB — POC OCCULT BLOOD, ED: Fecal Occult Bld: POSITIVE — AB

## 2018-01-01 LAB — CBC
HEMATOCRIT: 26.7 % — AB (ref 36.0–46.0)
Hemoglobin: 8.5 g/dL — ABNORMAL LOW (ref 12.0–15.0)
MCH: 29.7 pg (ref 26.0–34.0)
MCHC: 31.8 g/dL (ref 30.0–36.0)
MCV: 93.4 fL (ref 78.0–100.0)
Platelets: 241 10*3/uL (ref 150–400)
RBC: 2.86 MIL/uL — ABNORMAL LOW (ref 3.87–5.11)
RDW: 15.9 % — AB (ref 11.5–15.5)
WBC: 16.3 10*3/uL — AB (ref 4.0–10.5)

## 2018-01-01 LAB — AMMONIA: Ammonia: 31 umol/L (ref 9–35)

## 2018-01-01 LAB — I-STAT CG4 LACTIC ACID, ED
LACTIC ACID, VENOUS: 2.52 mmol/L — AB (ref 0.5–1.9)
Lactic Acid, Venous: 2.82 mmol/L (ref 0.5–1.9)

## 2018-01-01 MED ORDER — INSULIN ASPART 100 UNIT/ML ~~LOC~~ SOLN
SUBCUTANEOUS | Status: AC
  Filled 2018-01-01: qty 1

## 2018-01-01 MED ORDER — SODIUM CHLORIDE 0.9 % IV BOLUS
500.0000 mL | Freq: Once | INTRAVENOUS | Status: AC
  Administered 2018-01-01: 500 mL via INTRAVENOUS

## 2018-01-01 MED ORDER — ALBUTEROL SULFATE (2.5 MG/3ML) 0.083% IN NEBU
20.0000 mg | INHALATION_SOLUTION | Freq: Once | RESPIRATORY_TRACT | Status: DC
  Filled 2018-01-01: qty 24

## 2018-01-01 MED ORDER — DEXTROSE 50 % IV SOLN: 1.0000 | Freq: Once | INTRAVENOUS | Status: DC

## 2018-01-01 MED ORDER — VANCOMYCIN HCL IN DEXTROSE 1-5 GM/200ML-% IV SOLN: 1000.0000 mg | INTRAVENOUS | Status: DC

## 2018-01-01 MED ORDER — SODIUM CHLORIDE 0.9 % IV SOLN
1.0000 g | Freq: Once | INTRAVENOUS | Status: DC
  Filled 2018-01-01: qty 1

## 2018-01-01 MED ORDER — INSULIN ASPART 100 UNIT/ML ~~LOC~~ SOLN: 10.0000 [IU] | Freq: Once | SUBCUTANEOUS | Status: DC

## 2018-01-01 MED ORDER — SODIUM CHLORIDE 0.9 % IV SOLN: 2.0000 g | INTRAVENOUS | Status: DC

## 2018-01-01 MED ORDER — ALBUTEROL (5 MG/ML) CONTINUOUS INHALATION SOLN
20.0000 mg/h | INHALATION_SOLUTION | RESPIRATORY_TRACT | Status: DC
  Administered 2018-01-01: 20 mg/h via RESPIRATORY_TRACT
  Filled 2018-01-01: qty 20

## 2018-01-01 MED ORDER — VANCOMYCIN HCL 10 G IV SOLR
2000.0000 mg | Freq: Once | INTRAVENOUS | Status: DC
  Filled 2018-01-01: qty 2000

## 2018-01-01 MED ORDER — CALCIUM CHLORIDE 10 % IV SOLN
1.0000 g | Freq: Once | INTRAVENOUS | Status: AC
  Administered 2018-01-01: 1 g via INTRAVENOUS
  Filled 2018-01-01: qty 10

## 2018-01-01 MED ORDER — ALBUTEROL (5 MG/ML) CONTINUOUS INHALATION SOLN
INHALATION_SOLUTION | RESPIRATORY_TRACT | Status: AC
  Administered 2018-01-01: 20 mg/h via RESPIRATORY_TRACT
  Filled 2018-01-01: qty 20

## 2018-01-01 MED ORDER — FENTANYL CITRATE (PF) 100 MCG/2ML IJ SOLN
50.0000 ug | Freq: Once | INTRAMUSCULAR | Status: AC
  Administered 2018-01-01: 50 ug via INTRAVENOUS
  Filled 2018-01-01: qty 2

## 2018-01-01 MED ORDER — METOCLOPRAMIDE HCL 5 MG/ML IJ SOLN
10.0000 mg | Freq: Once | INTRAMUSCULAR | Status: AC
  Administered 2018-01-01: 10 mg via INTRAVENOUS
  Filled 2018-01-01: qty 2

## 2018-01-02 ENCOUNTER — Telehealth: Payer: Self-pay | Admitting: Nurse Practitioner

## 2018-01-02 DIAGNOSIS — A419 Sepsis, unspecified organism: Secondary | ICD-10-CM | POA: Insufficient documentation

## 2018-01-02 DIAGNOSIS — N186 End stage renal disease: Secondary | ICD-10-CM | POA: Insufficient documentation

## 2018-01-02 DIAGNOSIS — N19 Unspecified kidney failure: Secondary | ICD-10-CM | POA: Insufficient documentation

## 2018-01-02 DIAGNOSIS — Z992 Dependence on renal dialysis: Secondary | ICD-10-CM

## 2018-01-02 DIAGNOSIS — I468 Cardiac arrest due to other underlying condition: Secondary | ICD-10-CM | POA: Insufficient documentation

## 2018-01-02 DIAGNOSIS — N049 Nephrotic syndrome with unspecified morphologic changes: Secondary | ICD-10-CM | POA: Insufficient documentation

## 2018-01-02 DIAGNOSIS — T25022A Burn of unspecified degree of left foot, initial encounter: Secondary | ICD-10-CM | POA: Insufficient documentation

## 2018-01-02 LAB — BLOOD CULTURE ID PANEL (REFLEXED)

## 2018-01-03 MED FILL — Medication: Qty: 1 | Status: AC

## 2018-01-04 LAB — CULTURE, BLOOD (ROUTINE X 2): Special Requests: ADEQUATE

## 2018-01-06 LAB — CULTURE, BLOOD (ROUTINE X 2)
Culture: NO GROWTH
Special Requests: ADEQUATE

## 2018-01-17 NOTE — Code Documentation (Signed)
1 epi given 

## 2018-01-17 NOTE — Code Documentation (Addendum)
7.5 et tube placed 24@ lip, no pulse, cpr resumed

## 2018-01-17 NOTE — Code Documentation (Signed)
Pulse check, no pulse, cpr resumed 

## 2018-01-17 NOTE — Code Documentation (Addendum)
2g mag given, CPR in progress, 1 epi given

## 2018-01-17 NOTE — ED Notes (Signed)
Family notified this RN they think pt is seizing. Upon arrival to the room pt with emesis noted on chest. Pt is non responsive. Checked for a pulse as monitor showed asystole. CPR initiated.

## 2018-01-17 NOTE — ED Notes (Signed)
Pulse check, asystole, CPR resumed 

## 2018-01-17 NOTE — Progress Notes (Signed)
Pharmacy Antibiotic Note  Lindsey Sutton is a 72 y.o. female admitted on September 26, 2017 with sepsis.  Pharmacy has been consulted for vancomycin/cefepime dosing. Afebrile, WBC 16.3, LA 2.82. ESRD on HD TTSa, missed last HD on 4/13 due to weakness/SOB. Zosyn originally ordered but noted swelling allergy reported to PCN, but patient does have history of outpatient prescription for Keflex on profile. Per discussion with EDP, ok to continue with cefepime dosing.  Plan: Cefepime 1g IV x 1; then 2g IV qTTS at 1800 Vancomycin 2g IV x1; then 1g IV qHD TTSa Monitor clinical progress, c/s, abx plan/LOT Pre-HD vancomycin level as indicated F/u HD schedule/tolerance inpatient for antibiotic maintenance doses   Height: 5\' 7"  (170.2 cm) Weight: 220 lb (99.8 kg) IBW/kg (Calculated) : 61.6  Temp (24hrs), Avg:96.4 F (35.8 C), Min:96.4 F (35.8 C), Max:96.4 F (35.8 C)  Recent Labs  Lab 12/28/17 1631 12/28/17 1648 2018/01/18 1421 2018/01/18 1510  WBC 3.9*  --  16.3*  --   CREATININE 6.05*  --   --  8.00*  LATICACIDVEN  --  1.25  --  2.82*    Estimated Creatinine Clearance: 7.8 mL/min (A) (by C-G formula based on SCr of 8 mg/dL (H)).    Allergies  Allergen Reactions  . Penicillins Swelling    Has patient had a PCN reaction causing immediate rash, facial/tongue/throat swelling, SOB or lightheadedness with hypotension: Yes Has patient had a PCN reaction causing severe rash involving mucus membranes or skin necrosis: No Has patient had a PCN reaction that required hospitalization No Has patient had a PCN reaction occurring within the last 10 years: No If all of the above answers are "NO", then may proceed with Cephalosporin use.     Antimicrobials this admission: 4/15 vancomycin >>  4/15 cefepime >>   Dose adjustments this admission:   Microbiology results:   Lindsey Sutton, PharmD, BCPS Clinical Pharmacist September 26, 2017 3:23 PM

## 2018-01-17 NOTE — ED Notes (Signed)
1 am of Epi given

## 2018-01-17 NOTE — Code Documentation (Signed)
No pulse, cpr resumed 

## 2018-01-17 NOTE — ED Notes (Signed)
Paged the chaplain 

## 2018-01-17 NOTE — ED Triage Notes (Signed)
Pt states weakness since Sat.  Missed her sat dialysis d/t the weakness and sob. Also c/o abdominal distension.  Recent tx for burn to L foot.  CBG 193, bp 104/60, hr 84, rr 16, 96% 2L 92 % ra).  Pt stated relief of sob with O2.
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
## 2018-01-17 NOTE — Code Documentation (Signed)
1 amp d50 given 

## 2018-01-17 NOTE — ED Notes (Signed)
Admitting at bedside 

## 2018-01-17 NOTE — ED Notes (Signed)
Pulse check,  End tidal inline with 0 reading. TOD called 1739. MD hensel.

## 2018-01-17 NOTE — ED Notes (Signed)
Asystole, CPR resumed

## 2018-01-17 NOTE — ED Provider Notes (Signed)
72 [facility]. [facility] on dialysis, possible infection.  Called to bedside due to cardiac arrest.  Patient with compressions, and bvm intact on my arrival. Magnesium 2 grams Insulin 10 units d50 Epi x 5 ACLS protocol see Code documentation. Procedure Name: Intubation Date/Time: Sep 03, 2018 5:25 PM Performed by: Margarita Grizzleay, Chrishaun Sasso, MD Oxygen Delivery Method: Ambu bag Preoxygenation: Pre-oxygenation with 100% oxygen Ventilation: Mask ventilation with difficulty Laryngoscope Size: Glidescope Grade View: Grade I Tube size: 7.5 mm Number of attempts: 1 Placement Confirmation: ETT inserted through vocal cords under direct vision,  Positive ETCO2 and Breath sounds checked- equal and bilateral Tube secured with: ETT holder Dental Injury: Teeth and Oropharynx as per pre-operative assessment        EMERGENCY DEPARTMENT US CARDIAC EXAM "Study: Limited Ultrasound of the Heart and Pericardium"  INDICATIONS:Cardiac arrest Multiple views of the heart and pericardium were obtained in real-time with a multi-frequency probe.  PERFORMED RU:EAVWUJBY:Myself IMAGES ARCHIVED?: Yes LIMITATIONS:  Emergent procedure VIEWS USED: Parasternal short axis INTERPRETATION: Cardiac activity absent   Dr. Leveda [facility]Hensel [facility] arrived and assumed care.  1730     Margarita Grizzleay, Kaikoa Magro, MD 25-Mar-2018 1759

## 2018-01-17 NOTE — ED Notes (Signed)
1 epi given 

## 2018-01-17 NOTE — ED Provider Notes (Addendum)
[facility] Provider Note   CSN: [id number] Arrival date & time: 11-05-2017  1413     History   Chief Complaint Chief Complaint  Patient presents with  . Fatigue    HPI Lindsey PearCarol Sutton is a 72 y.o. female with history of end-stage renal disease on dialysis, COPD, atrial fibrillation, type 2 diabetes, noncompliance who presents with progressive worsening shortness of breath, nausea, vomiting, and left foot pain.  Patient was evaluated on 12/28/2017 for a burn to her left great and second toe after she spilled bacon grease on it several days prior.  She was discharged home.  Over the past few days, patient has become more short of breath.  She missed dialysis on Saturday.  She has also began having nausea, vomiting.  She denies chest pain or abdominal pain.  She also reports a one-month history of dark stools.  Patient does not take her medications regularly.  She reportedly to dialysis 3 times in the past 3 weeks.  She is supposed to go Tuesday, Thursday, Saturday.  HPI  Past Medical History:  Diagnosis Date  . A-fib (HCC)   . Anxiety   . Arthritis    "legs" (04/20/2017)  . COPD (chronic obstructive pulmonary disease) (HCC)   . Depression   . ESRD (end stage renal disease) on dialysis (HCC)    "TTS; [facility]; [place]" (04/20/2017)  . Heart murmur   . History of blood transfusion    related to childbirth  . Iron deficiency anemia   . NAFLD (nonalcoholic fatty liver disease) 8/4/69625/01/2017  . Obesity (BMI 30-39.9) 01/20/2017  . Pacemaker   . Pulmonary edema   . Secondary hyperparathyroidism of renal origin (HCC) 01/22/2017  . Systolic heart failure (HCC)   . Thrombocytopenia (HCC) 01/21/2017  . Type II diabetes mellitus [facility](HCC)     Patient Active Problem List   Diagnosis Date Noted  . Hyperkalemia March 31, 2018  . Acute respiratory distress 10/06/2017  . Palliative care by specialist   . Acute metabolic encephalopathy 04/20/2017  . Gait instability  04/20/2017  . Frequent falls 04/20/2017  . Anemia in chronic kidney disease (CKD) 04/20/2017  . Pancytopenia (HCC) 01/23/2017  . Secondary hyperparathyroidism of renal origin (HCC) 01/22/2017  . Hyperphosphatemia 01/22/2017  . Aortic stenosis, moderate-severe 01/22/2017  . Essential hypertension 01/21/2017  . Hyponatremia 01/21/2017  . NAFLD (nonalcoholic fatty liver disease) 95/28/413205/01/2017  . Thrombocytopenia (HCC) 01/21/2017  . Obesity (BMI 30-39.9) 01/20/2017  . Hip fx (HCC) 01/19/2017  . A-fib (HCC) 01/19/2017  . ESRD (end stage renal disease) (HCC) 01/19/2017  . Pacemaker   . Iron deficiency anemia   . Systolic heart failure (HCC)   . Ascites   . DM (diabetes mellitus), type 2 with renal complications (HCC)   . Nondisplaced intertrochanteric fracture of left femur, initial encounter for closed fracture [facility](HCC)     Past Surgical History:  Procedure Laterality Date  . AV FISTULA PLACEMENT Left ~ 2003  . AV FISTULA REPAIR Left X 2  . CARDIAC CATHETERIZATION    . CATARACT EXTRACTION W/ INTRAOCULAR LENS  IMPLANT, BILATERAL Bilateral 2017  . FRACTURE SURGERY    . INSERT / REPLACE / REMOVE PACEMAKER  ~ 2012  . INTRAMEDULLARY (IM) NAIL INTERTROCHANTERIC Left 01/20/2017   Procedure: INTRAMEDULLARY (IM) NAIL INTERTROCHANTRIC;  Surgeon: Tarry KosXu, Naiping M, MD;  Location: [facility] OR;  Service: Orthopedics;  Laterality: Left;  . IR PARACENTESIS  10/06/2017  . IR PARACENTESIS  10/27/2017  . IR PARACENTESIS  11/15/2017  .  IR PARACENTESIS  11/24/2017  . IR PARACENTESIS  12/15/2017     OB History   None      Home Medications    Prior to Admission medications   Medication Sig Start Date End Date Taking? Authorizing Provider  albuterol (PROVENTIL) (2.5 MG/3ML) 0.083% nebulizer solution Take 3 mLs (2.5 mg total) by nebulization every 4 (four) hours as needed for wheezing or shortness of breath. Patient not taking: Reported on 11/23/2017 04/25/17   Cleora Fleet, MD  aspirin EC 81 MG tablet Take 81 mg  by mouth daily.    [provider]  budesonide-formoterol (SYMBICORT) 160-4.5 MCG/ACT inhaler Inhale 2 puffs into the lungs 2 (two) times daily.    [provider]  cephALEXin (KEFLEX) 250 MG capsule Take 2 capsules (500 mg total) by mouth 2 (two) times daily. 12/28/17   Gwyneth Sprout, MD  cinacalcet (SENSIPAR) 30 MG tablet Take 30 mg by mouth daily with supper.    [provider]  Darbepoetin Alfa (ARANESP) 100 MCG/0.5ML SOSY injection Inject 0.5 mLs (100 mcg total) into the vein every Saturday with hemodialysis. 10/14/17   Barnetta Chapel, MD  doxercalciferol (HECTOROL) 4 MCG/2ML injection Inject 2 mLs (4 mcg total) into the vein Every Tuesday,Thursday,and Saturday with dialysis. 10/10/17   Barnetta Chapel, MD  escitalopram (LEXAPRO) 10 MG tablet Take 1 tablet (10 mg total) by mouth daily. 04/25/17   Johnson, Clanford L, MD  folic acid (FOLVITE) 1 MG tablet Take 1 tablet (1 mg total) by mouth daily. Patient not taking: Reported on 11/23/2017 04/25/17   Standley Dakins L, MD  midodrine (PROAMATINE) 5 MG tablet Take 5 mg by mouth 3 (three) times a week. Take 30 minutes prior to dialysis    [provider]  multivitamin (RENA-VIT) TABS tablet Take 1 tablet by mouth at bedtime. 10/10/17   Barnetta Chapel, MD  Nutritional Supplements (FEEDING SUPPLEMENT, NEPRO CARB STEADY,) LIQD Take 237 mLs by mouth daily at 3 pm. Patient not taking: Reported on 10/06/2017 04/25/17   Cleora Fleet, MD  pantoprazole (PROTONIX) 40 MG tablet Take 40 mg by mouth daily.    [provider]  polyethylene glycol (MIRALAX / GLYCOLAX) packet Take 17 g by mouth daily. Patient not taking: Reported on 11/23/2017 04/25/17   Cleora Fleet, MD  pregabalin (LYRICA) 150 MG capsule Take 150 mg by mouth 2 (two) times daily.     [provider]  senna-docusate (SENOKOT-S) 8.6-50 MG tablet Take 1 tablet by mouth 2 (two) times daily. Patient not taking: Reported on 11/23/2017  01/26/17   Rai, Delene Ruffini, MD  sevelamer carbonate (RENVELA) 800 MG tablet Take 4 tablets (3,200 mg total) by mouth 3 (three) times daily with meals. 10/10/17   Barnetta Chapel, MD    Family History Family History  Problem Relation Age of Onset  . Diabetes Mother   . Hypertension Father     Social History Social History   Tobacco Use  . Smoking status: Former Smoker    Packs/day: 1.00    Years: 33.00    Pack years: 33.00    Types: Cigarettes    Last attempt to quit: 09/23/1996    Years since quitting: 21.2  . Smokeless tobacco: Never Used  Substance Use Topics  . Alcohol use: No    Comment: 04/20/2017 "nothing in the 2000s"  . Drug use: No     Allergies   Penicillins   Review of Systems Review of Systems  Constitutional:  Positive for appetite change. Negative for chills and fever.  HENT: Negative for facial swelling and sore throat.   Respiratory: Positive for shortness of breath.   Cardiovascular: Negative for chest pain.  Gastrointestinal: Positive for abdominal distention, nausea and vomiting. Negative for abdominal pain.  Genitourinary: Negative for dysuria (makes no urine).  Musculoskeletal: Negative for back pain.  Skin: Positive for color change and wound. Negative for rash.  Neurological: Negative for headaches.  Psychiatric/Behavioral: The patient is not nervous/anxious.      Physical Exam Updated Vital Signs BP 100/75   Pulse 81   Temp (!) 96.4 F (35.8 C) (Rectal)   Resp 19   Ht 5\' 7"  (1.702 m)   Wt 99.8 kg (220 lb)   SpO2 95%   BMI 34.46 kg/m   Physical Exam  Constitutional: She appears well-developed and well-nourished. No distress.  HENT:  Head: Normocephalic and atraumatic.  Mouth/Throat: Oropharynx is clear and moist. No oropharyngeal exudate.  Eyes: Pupils are equal, round, and reactive to light. Conjunctivae are normal. Right eye exhibits no discharge. Left eye exhibits no discharge. No scleral icterus.  Neck: Normal range of  motion. Neck supple. No thyromegaly present.  Cardiovascular: Normal rate, regular rhythm, normal heart sounds and intact distal pulses. Exam reveals no gallop and no friction rub.  No murmur heard. Pulmonary/Chest: Effort normal and breath sounds normal. No stridor. No respiratory distress. She has no wheezes. She has no rales.  Abdominal: Soft. Bowel sounds are normal. She exhibits no distension. There is no tenderness. There is no rebound and no guarding.  Musculoskeletal: She exhibits no edema.  Left great toe with skin loss, erythema, yellowing, drainage; 2nd left toe blanched - see photo  Lymphadenopathy:    She has no cervical adenopathy.  Neurological: She is alert. Coordination normal.  Skin: Skin is warm and dry. No rash noted. She is not diaphoretic. No pallor.  Yellowed skin  Psychiatric: She has a normal mood and affect.  Nursing note and vitals reviewed.      ED Treatments / Results  Labs (all labs ordered are listed, but only abnormal results are displayed) Labs Reviewed  CBC - Abnormal; Notable for the following components:      Result Value   WBC 16.3 (*)    RBC 2.86 (*)    Hemoglobin 8.5 (*)    HCT 26.7 (*)    RDW 15.9 (*)    All other components within normal limits  COMPREHENSIVE METABOLIC PANEL - Abnormal; Notable for the following components:   Sodium 124 (*)    Potassium 6.6 (*)    Chloride 87 (*)    CO2 10 (*)    Glucose, Bld 171 (*)    BUN 76 (*)    Creatinine, Ser 8.38 (*)    Calcium 6.0 (*)    Total Protein 5.3 (*)    Albumin 2.1 (*)    AST 14 (*)    ALT 9 (*)    GFR calc non Af Amer 4 (*)    GFR calc Af Amer 5 (*)    Anion gap 27 (*)    All other components within normal limits  I-STAT CG4 LACTIC ACID, ED - Abnormal; Notable for the following components:   Lactic Acid, Venous 2.82 (*)    All other components within normal limits  POC OCCULT BLOOD, ED - Abnormal; Notable for the following components:   Fecal Occult Bld POSITIVE (*)     All other components within normal limits  I-STAT CHEM 8, ED - Abnormal; Notable for the following components:   Sodium 121 (*)    Potassium 6.3 (*)    Chloride 94 (*)    BUN 73 (*)    Creatinine, Ser 8.00 (*)    Glucose, Bld 170 (*)    Calcium, Ion 0.66 (*)    TCO2 14 (*)    Hemoglobin 11.9 (*)    HCT 35.0 (*)    All other components within normal limits  I-STAT CG4 LACTIC ACID, ED - Abnormal; Notable for the following components:   Lactic Acid, Venous 2.52 (*)    All other components within normal limits  CULTURE, BLOOD (ROUTINE X 2)  CULTURE, BLOOD (ROUTINE X 2)  AMMONIA  URINALYSIS, ROUTINE W REFLEX MICROSCOPIC  BASIC METABOLIC PANEL  CBG MONITORING, ED    EKG EKG Interpretation  Date/Time:  Monday Jan 17, 2018 14:32:27 EDT Ventricular Rate:  81 PR Interval:    QRS Duration: 189 QT Interval:  527 QTC Calculation: 612 R Axis:   -105 Text Interpretation:  Electronic ventricular pacemaker Confirmed by Cathren Laine ([id number]) on 2018/01/17 3:03:27 PM   Radiology Dg Chest 2 View  Result Date: 17-Jan-2018 CLINICAL DATA:  Shortness of breath for several days EXAM: CHEST - 2 VIEW COMPARISON:  11/23/2017 FINDINGS: Cardiac shadow is enlarged. Pacing device is again seen and stable. The lungs are aerated bilaterally although the inspiratory effort is less than that seen on the prior exam. Some mild left basilar atelectatic changes are seen increased from the prior study. Small effusions are noted bilaterally stable from the prior exam. No acute bony abnormality is noted. IMPRESSION: Small effusion stable from previous exam. Slight increase in left basilar atelectasis. Electronically Signed   By: Alcide Clever M.D.   On: 2018/01/17 16:08   Dg Foot Complete Left  Result Date: 01-17-18 CLINICAL DATA:  Pain.  Recent burn. EXAM: LEFT FOOT - COMPLETE 3+ VIEW COMPARISON:  12/28/2017. FINDINGS: No acute fracture or malalignment. Extensive vascular calcification. Partially visualized  hardware related to ORIF of LEFT ankle fracture. Marked arthritic changes are stable, most notable at the tarsometatarsal joints, possible old deformities at the base of the second and third metatarsals. IMPRESSION: Chronic changes as described. No significant abnormality has developed since 12/28/2017. Electronically Signed   By: Elsie Stain M.D.   On: 01/17/2018 16:09    Procedures Procedures (including critical care time)  CRITICAL CARE Performed by: Emi Holes   Total critical care time: 30 minutes  Critical care time was exclusive of separately billable procedures and treating other patients.  Critical care was necessary to treat or prevent imminent or life-threatening deterioration.  Critical care was time spent personally by me on the following activities: development of treatment plan with patient and/or surrogate as well as nursing, discussions with consultants, evaluation of patient's response to treatment, examination of patient, obtaining history from patient or surrogate, ordering and performing treatments and interventions, ordering and review of laboratory studies, ordering and review of radiographic studies, pulse oximetry and re-evaluation of patient's condition.   Medications Ordered in ED Medications  sodium chloride 0.9 % bolus 500 mL (500 mLs Intravenous New Bag/Given Jan 17, 2018 1629)  vancomycin (VANCOCIN) 2,000 mg in sodium chloride 0.9 % 500 mL IVPB (has no administration in time range)  ceFEPIme (MAXIPIME) 1 g in sodium chloride 0.9 % 100 mL IVPB (has no administration in time range)  ceFEPIme (MAXIPIME) 2 g in sodium chloride 0.9 % 100 mL IVPB (has no administration in  time range)  vancomycin (VANCOCIN) IVPB 1000 mg/200 mL premix (has no administration in time range)  dextrose 50 % solution 50 mL (has no administration in time range)  insulin aspart (novoLOG) injection 10 Units (has no administration in time range)  albuterol (PROVENTIL,VENTOLIN) solution  continuous neb (20 mg/hr Nebulization New Bag/Given 01-10-18 1647)  metoCLOPramide (REGLAN) injection 10 mg (10 mg Intravenous Given 01/10/18 1629)  fentaNYL (SUBLIMAZE) injection 50 mcg (50 mcg Intravenous Given 10-Jan-2018 1629)  calcium chloride injection 1 g (1 g Intravenous Given 01/10/18 1630)     Initial Impression / Assessment and Plan / ED Course  I have reviewed the triage vital signs and the nursing notes.  Pertinent labs & imaging results that were available during my care of the patient were reviewed by me and considered in my medical decision making (see chart for details).     Patient with symptoms concerning for sepsis related to L foot burn initially evaluated on 12/28/2017.  Patient also with hyperkalemia and hypocalcemia.  Patient needing urgent dialysis.  Lactate 2.82. New leukocytosis from 4 days ago to 16.3. Blood cultures pending.  Cefepime and vancomycin initiated in the ED.  Calcium gluconate also given. Patient given 500 cc bolus, however I wanted to be conservative considering patient is needing dialysis.  I spoke with Dr. Signe Colt with nephrology who is aware of the patient and will take care of patient's dialysis during her admission.  I spoke with Dr. Janee Morn with the family medicine teaching service who will admit the patient for further evaluation.  I appreciate  is controlled symptoms help with this patient.  Patient also evaluated by Dr. Denton Lank who guided the patient's management and agrees with plan.  Final Clinical Impressions(s) / ED Diagnoses   Final diagnoses:  Hyperkalemia  Weakness  Shortness of breath  Sepsis, due to unspecified organism [facility])  Burn of left foot, unspecified burn degree, subsequent encounter    ED Discharge Orders    None       Verdis Prime 2018-01-10 1700    Cathren Laine, MD 01/03/18 1242    Emi Holes, PA-C 01/15/18 1403    Cathren Laine, MD 01/16/18 [phone]

## 2018-01-17 NOTE — Code Documentation (Signed)
10 units IV insulin administered

## 2018-01-17 NOTE — Progress Notes (Signed)
Brief Consult Note:  Ms. Lindsey Sutton is an [facility].  Being admitted for management of worsening infection of L great toe, volume overload w/ascites and hyperkalemia with K 6.3.  Patient seen and examined at bedside. Admits to SOB x 1 day and vomiting. Denies CP, change in BM.  Has been to 3 of 9 dialysis treatments in last 3 weeks.   Orders written for Urgent dialysis d/t hyperkalemia.    Formal consult note to follow.   Lindsey NorfolkLindsay Jacen Carlini, PA-C [facility] Pager: [phone] 2018/08/17, 11:18 AM

## 2018-01-17 NOTE — ED Notes (Signed)
Dr. Leveda AnnaHensel paged to RN Clydie BraunKaren per her request

## 2018-01-17 NOTE — ED Notes (Signed)
No pulse, CPR resumed

## 2018-01-17 DEATH — deceased

## 2018-04-09 NOTE — Telephone Encounter (Signed)
finished

## 2019-02-12 IMAGING — DX DG ABD PORTABLE 2V
2 series · 2 of 2 positions shown · non-contrast
Comparison: None.

CLINICAL DATA: Nausea and vomiting.

EXAM:
PORTABLE ABDOMEN - 2 VIEW

[abdomen supine (1 of 2)]
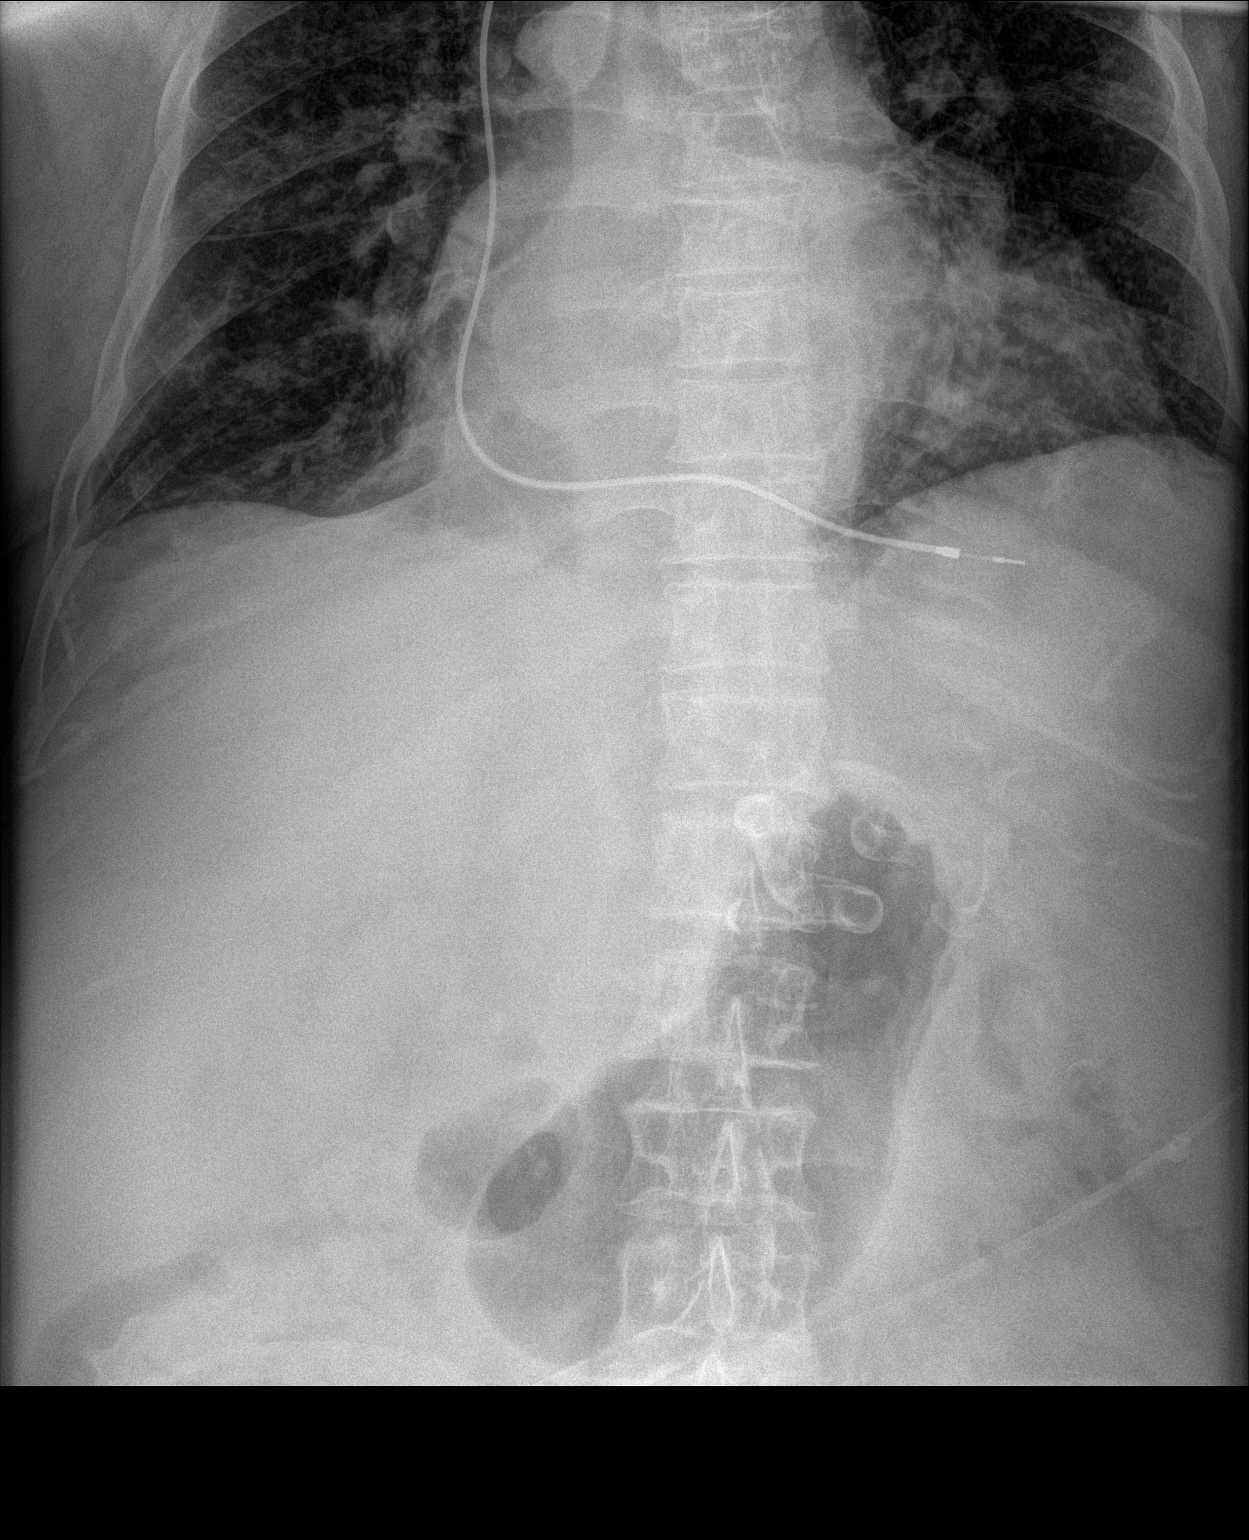

[abdomen supine (2 of 2)]
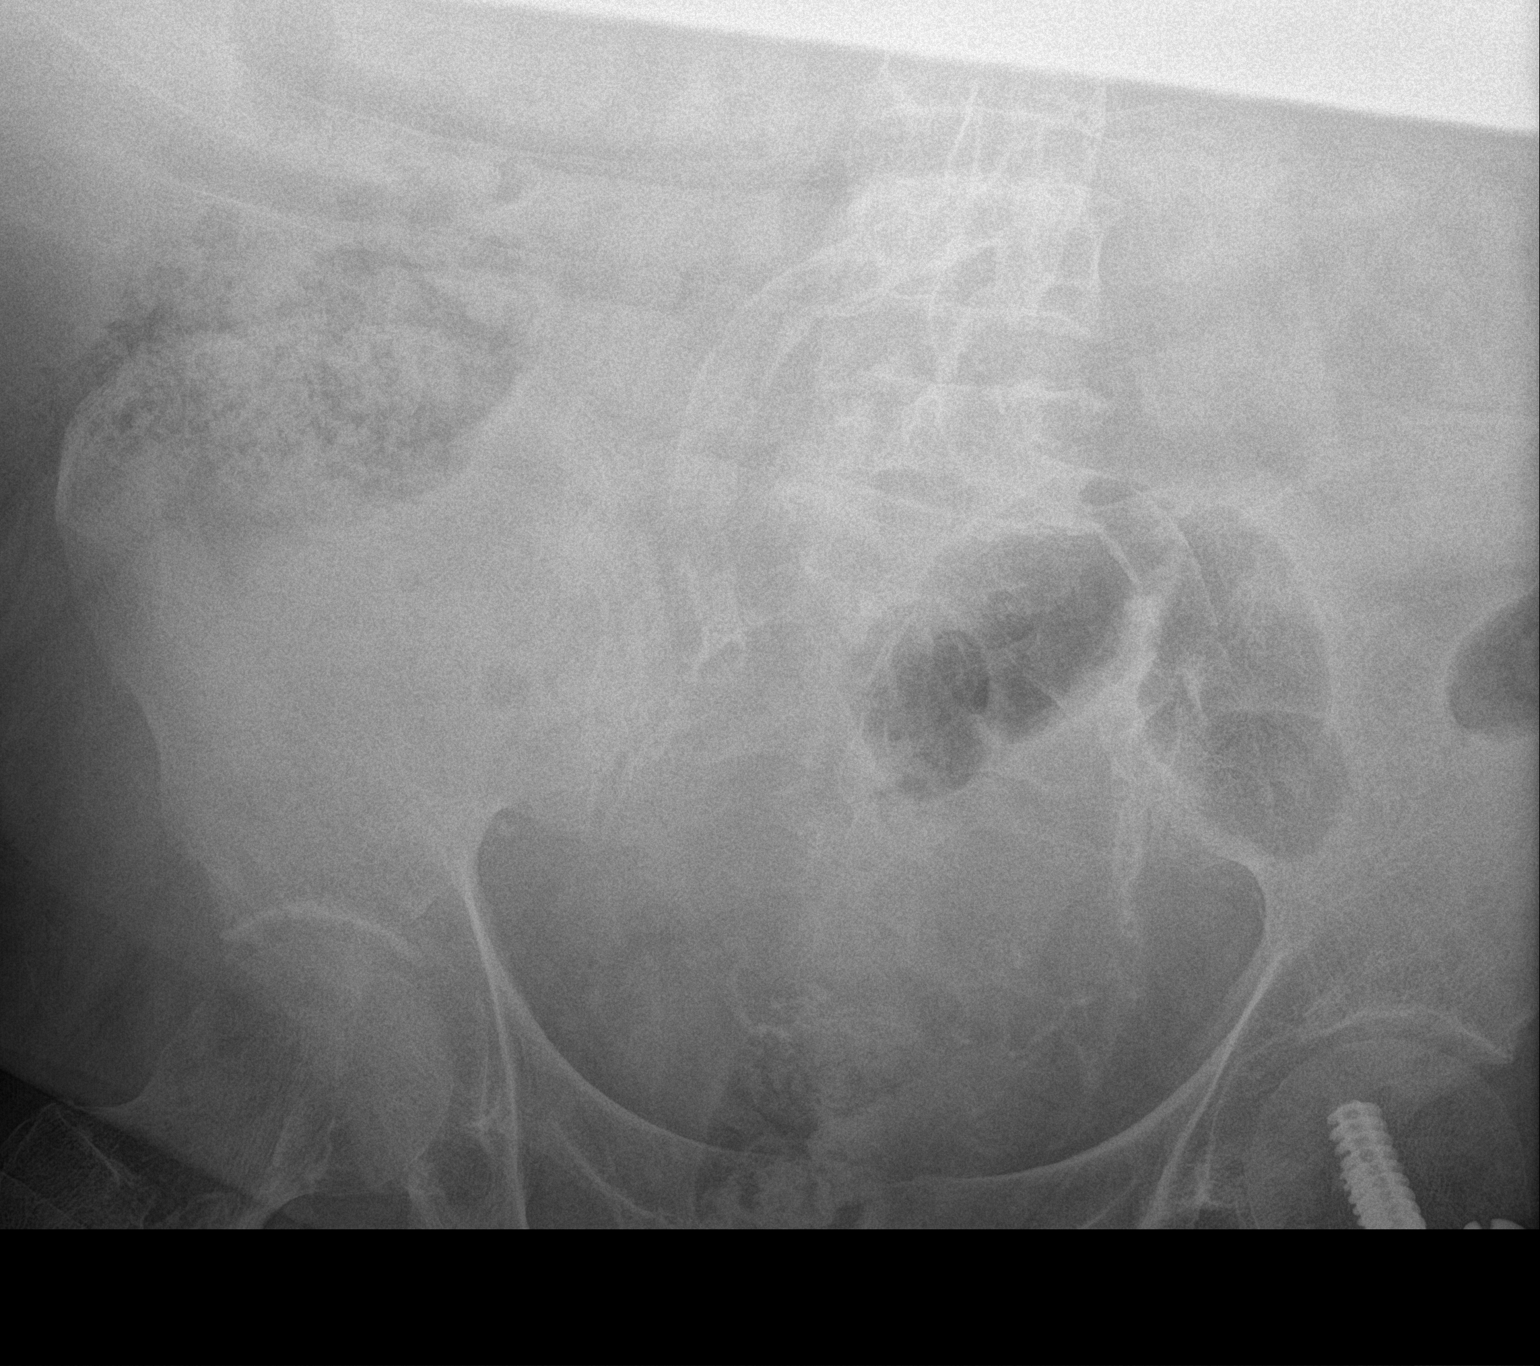

[2 of 2 positions shown; findings below may reference images not displayed]

FINDINGS: Normal bowel gas pattern. Negative for bowel obstruction or ileus.
No free air.

Atherosclerotic calcification. Left hip fracture repair. Single lead
pacemaker.
IMPRESSION: Normal bowel gas pattern.

## 2019-12-11 IMAGING — CR DG CHEST 2V
2 series · 2 of 2 positions shown · non-contrast
Comparison: 10/05/2017

CLINICAL DATA: Chest pain

EXAM:
CHEST - 2 VIEW

[chest lat]
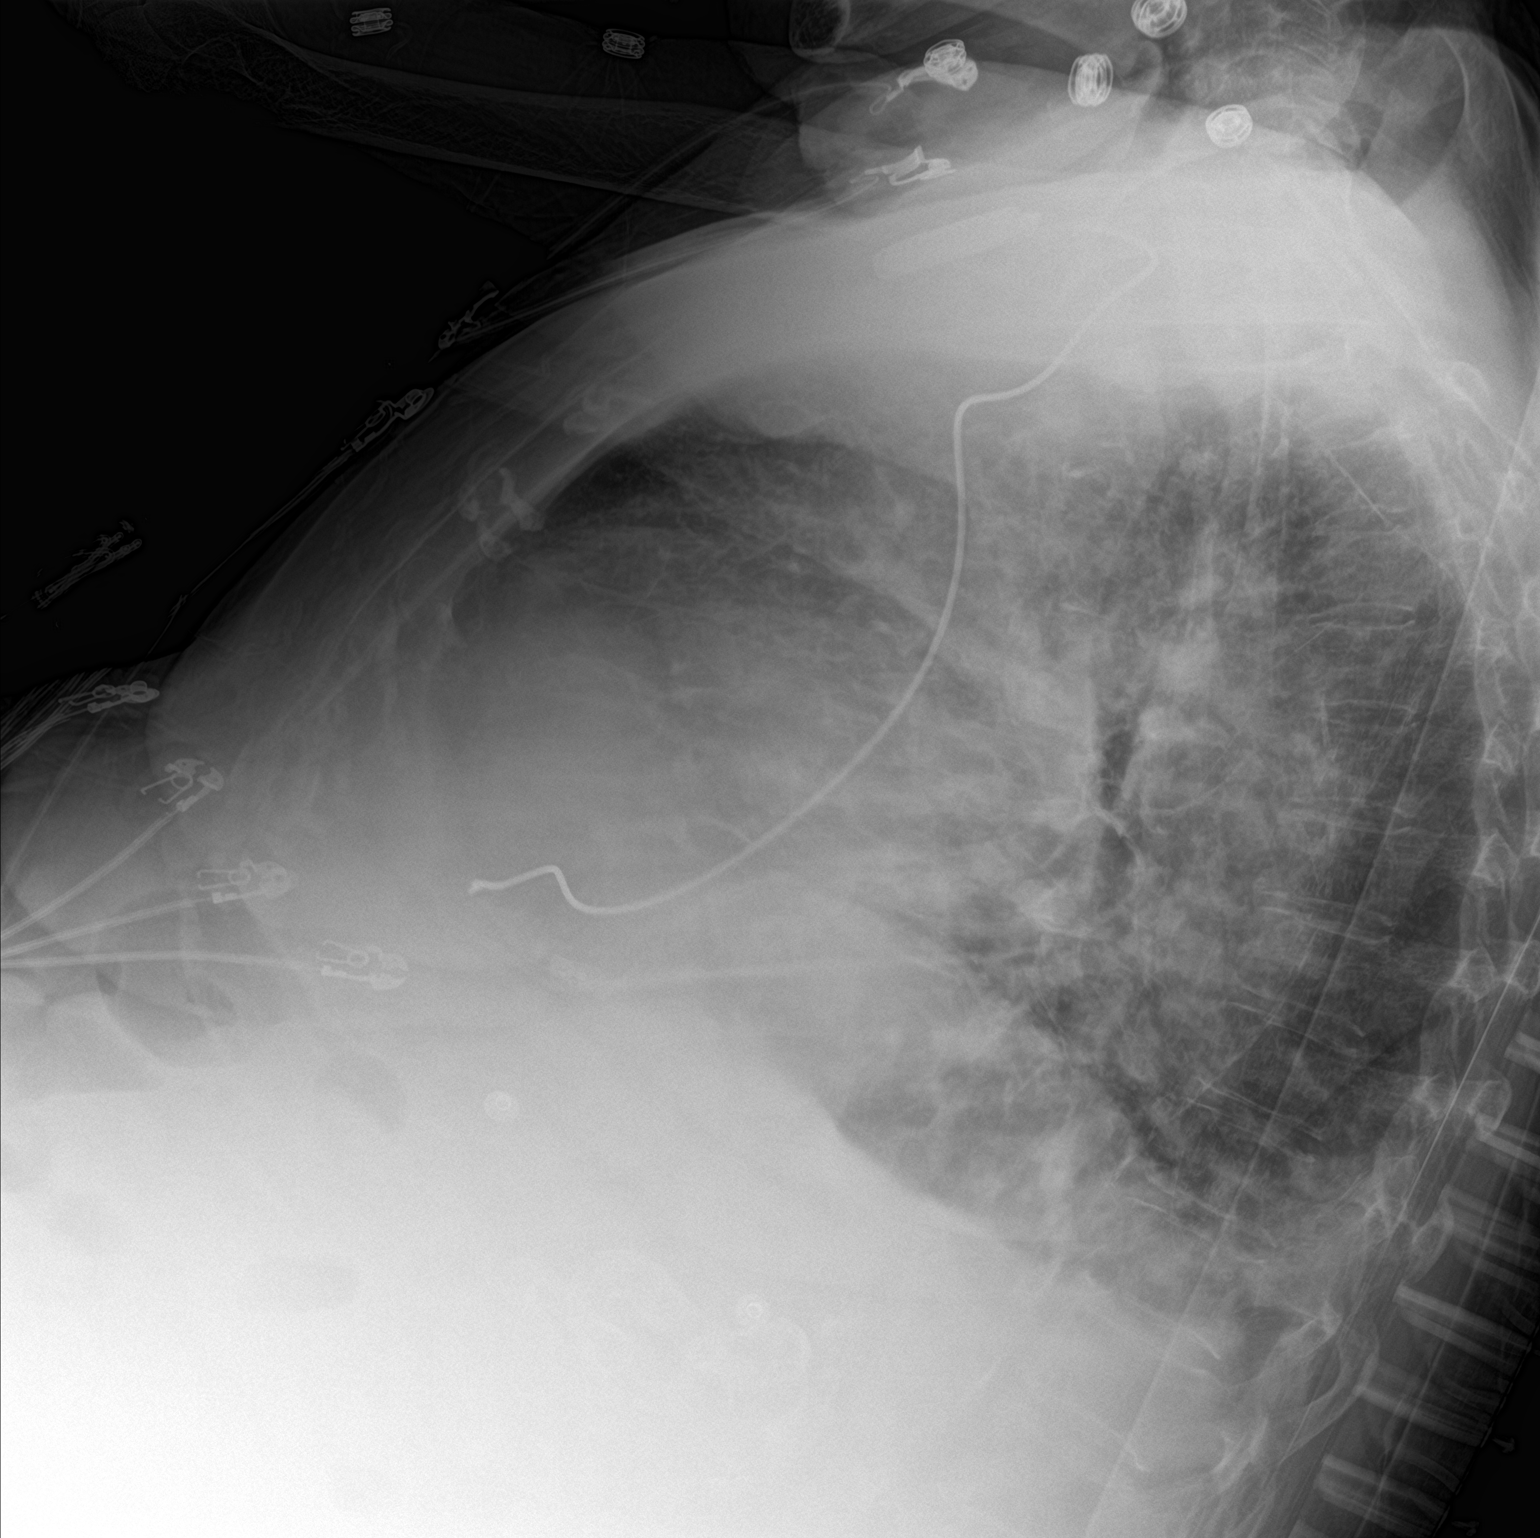

[chest ap]
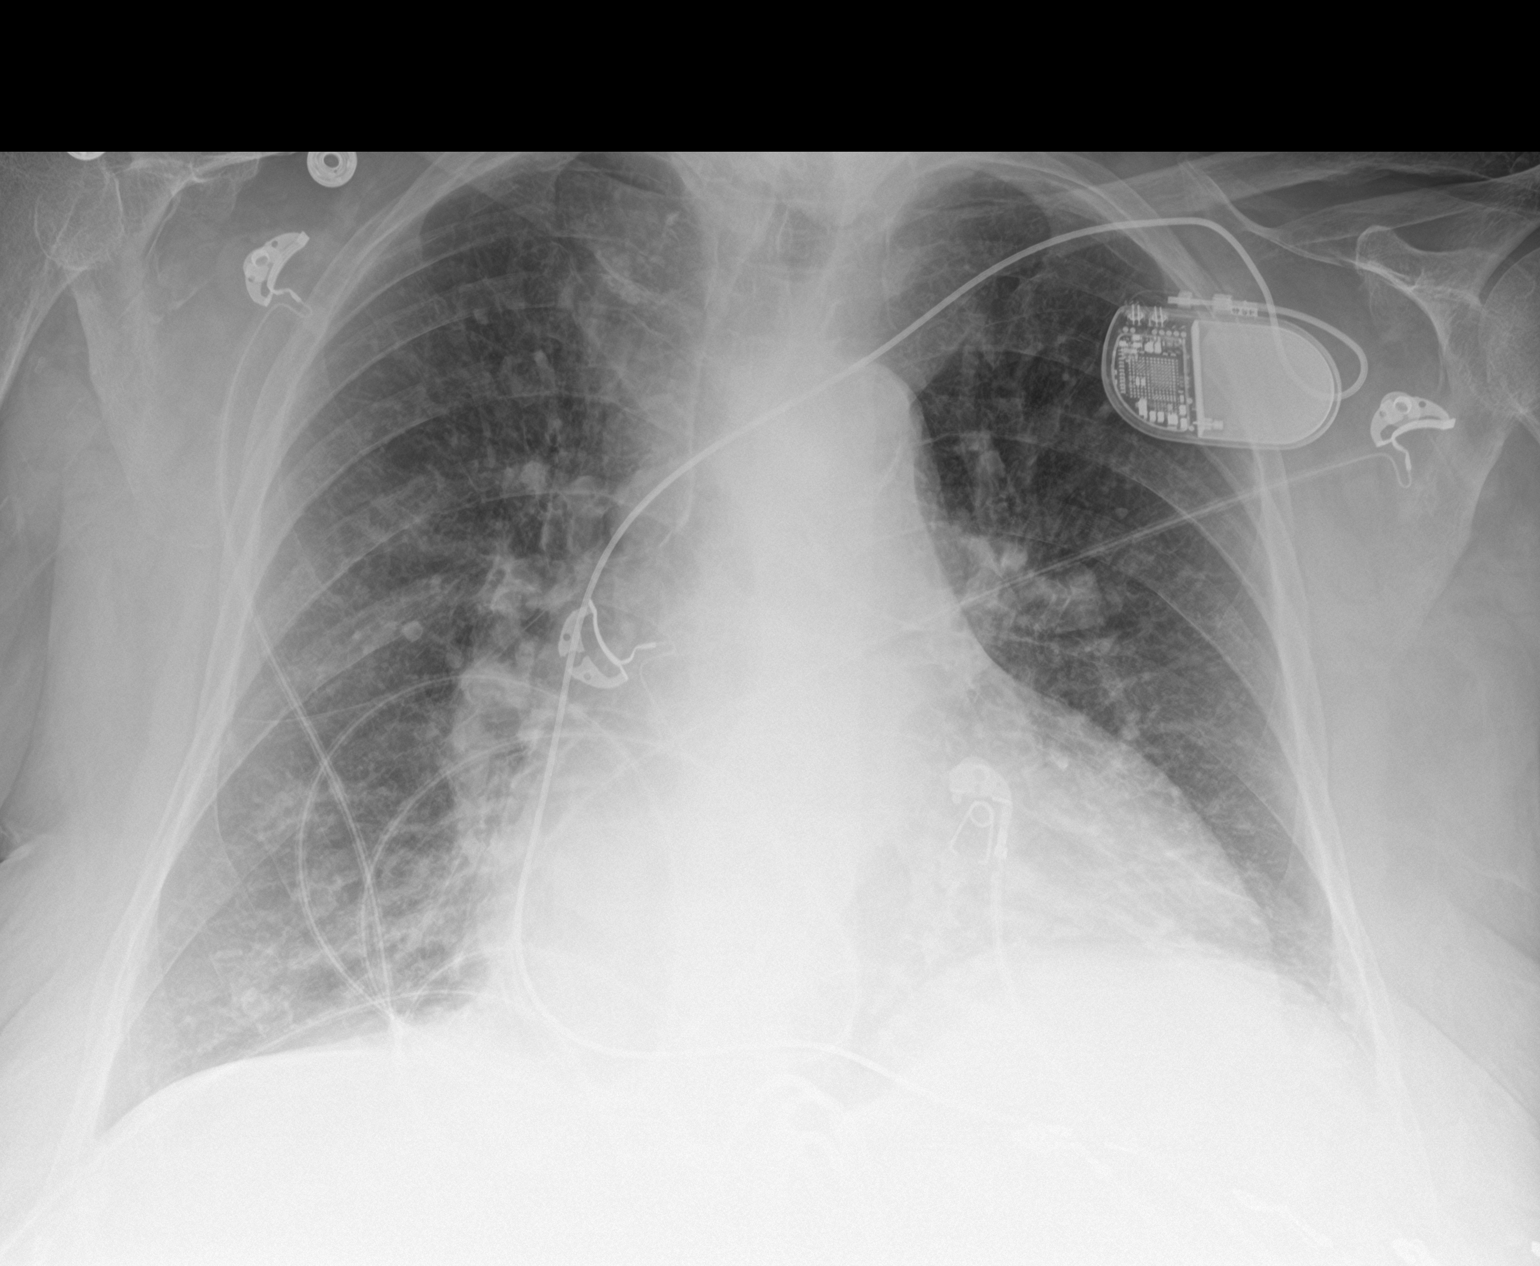

[2 of 2 positions shown; findings below may reference images not displayed]

FINDINGS: Small pleural effusions. Cardiomegaly with vascular congestion and
mild diffuse interstitial opacities suggesting edema. Streaky
atelectasis at the left lung base. Aortic atherosclerosis.
Left-sided pacing device as before.
IMPRESSION: Cardiomegaly with vascular congestion, small pleural effusions, and
mild diffuse interstitial opacities suggesting edema.

## 2019-12-12 IMAGING — US IR PARACENTESIS
1 series · 4 of 4 positions shown · non-contrast
Comparison: none

INDICATION: Possible cirrhosis, end-stage renal disease on hemodialysis,
recurrent massive ascites. Request is made for therapeutic
paracentesis.

[Series 1: ir (id) (id)/(id)/(id) ir · 4 of 4 slices shown]
[im 1/4]
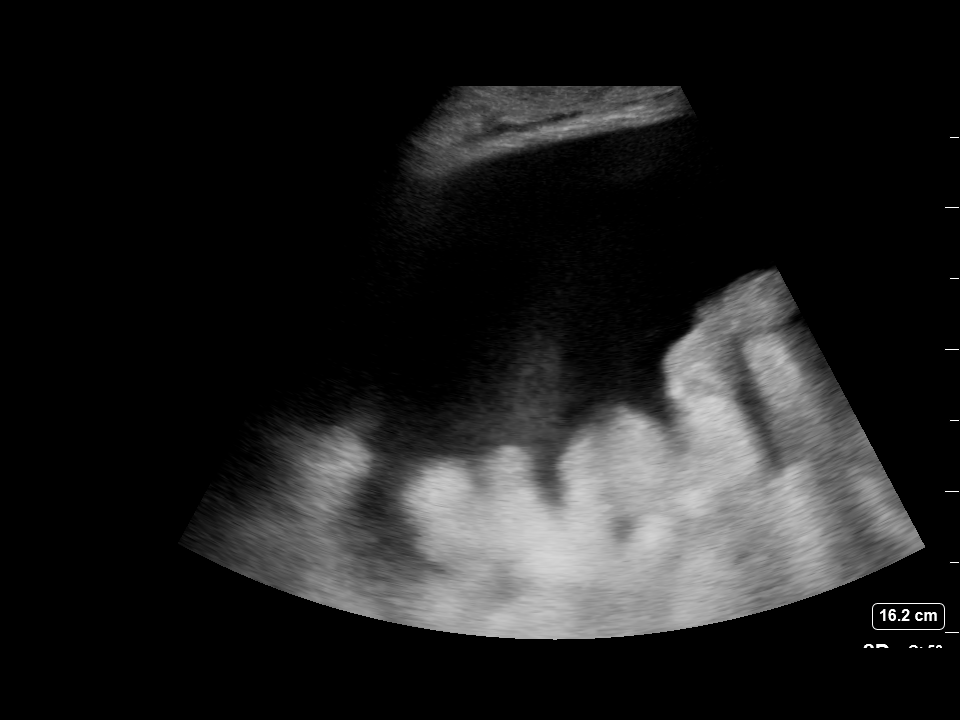
[im 2/4]
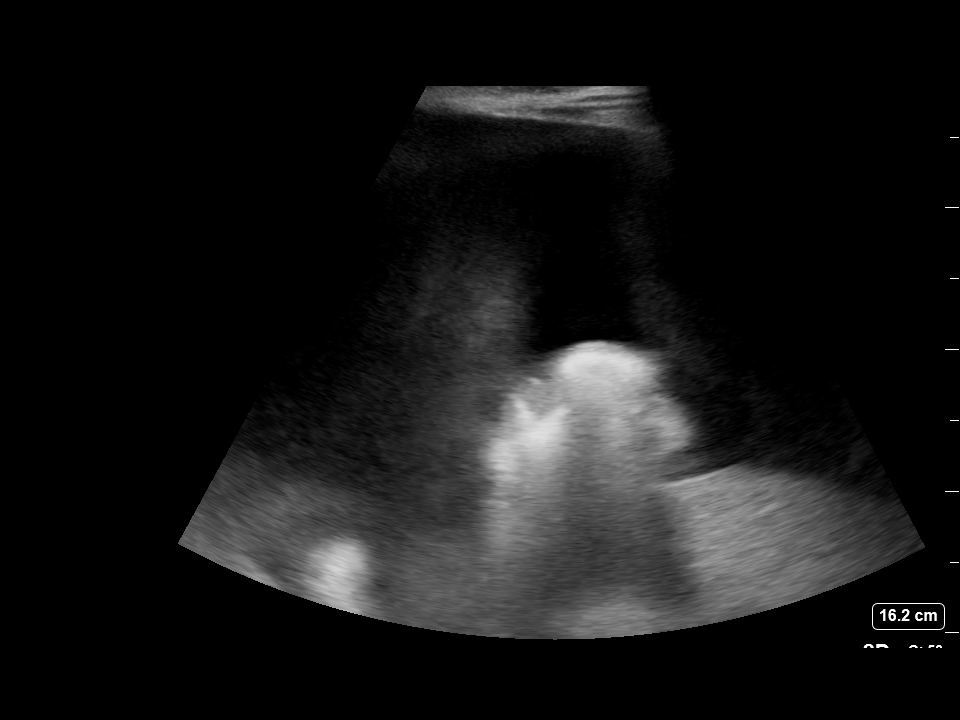
[im 3/4]
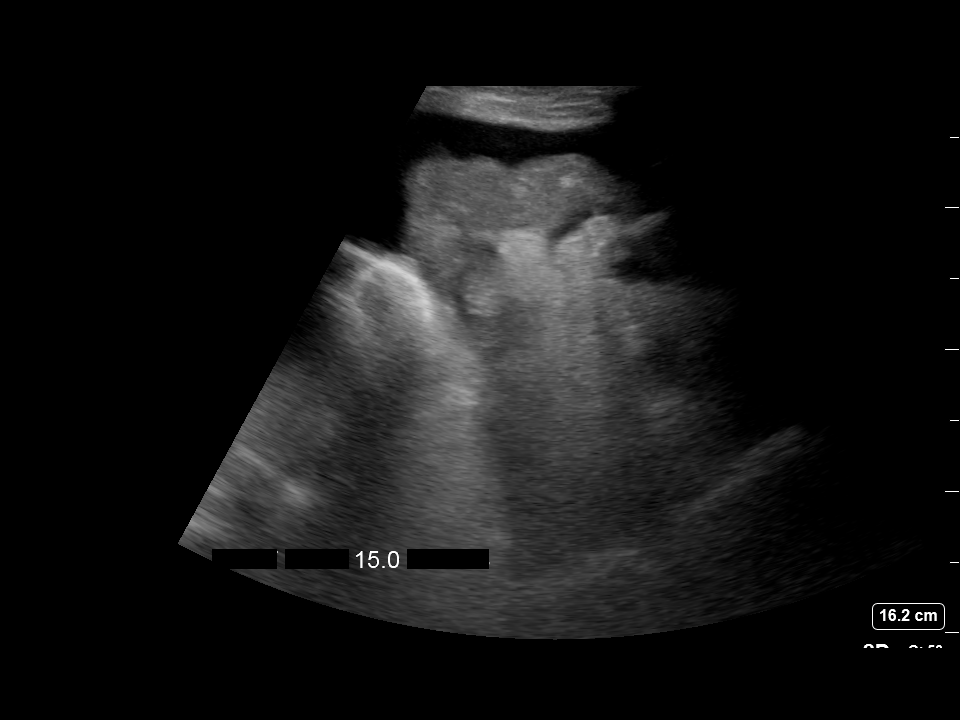
[im 4/4]
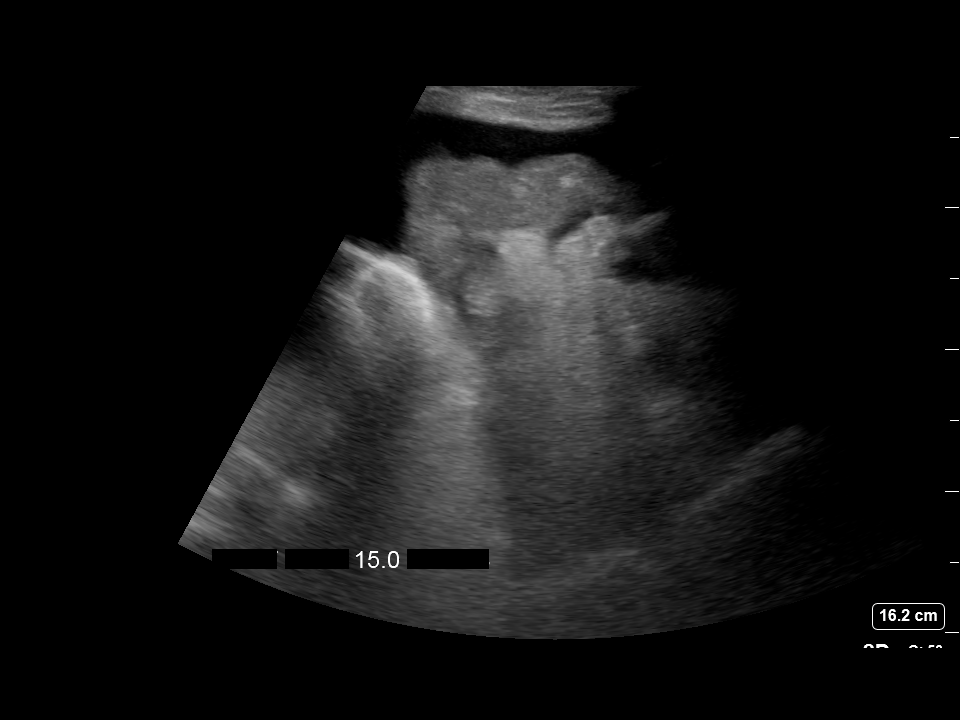

[4 of 4 positions shown; findings below may reference images not displayed]

EXAM:
ULTRASOUND GUIDED LEFT LATERAL ABDOMEN PARACENTESIS

MEDICATIONS:
1% Lidocaine = 10 mL

COMPLICATIONS:
None immediate.

PROCEDURE:
Informed written consent was obtained from the patient after a
discussion of the risks, benefits and alternatives to treatment. A
timeout was performed prior to the initiation of the procedure.

Initial ultrasound scanning demonstrates a large amount of ascites
within the left lateral abdomen. The left lateral abdomen was
prepped and draped in the usual sterile fashion. 1% lidocaine with
epinephrine was used for local anesthesia.

Following this, a 19 gauge, 7-cm, Yueh catheter was introduced. An
ultrasound image was saved for documentation purposes. The
paracentesis was performed. The catheter was removed and a dressing
was applied. The patient tolerated the procedure well without
immediate post procedural complication.
FINDINGS: A total of approximately 15 liters of clear yellow fluid was
removed.
IMPRESSION: Successful ultrasound-guided paracentesis yielding 15 liters of
peritoneal fluid.
